# Patient Record
Sex: Female | Born: 1945 | Race: White | Hispanic: No | Smoking: Former smoker
Health system: Southern US, Community
[De-identification: ages and names within clinical notes are randomized; demographics above are authoritative.]

## PROBLEM LIST (undated history)

## (undated) DIAGNOSIS — M1990 Unspecified osteoarthritis, unspecified site: Secondary | ICD-10-CM

## (undated) DIAGNOSIS — I10 Essential (primary) hypertension: Secondary | ICD-10-CM

## (undated) DIAGNOSIS — I209 Angina pectoris, unspecified: Secondary | ICD-10-CM

## (undated) DIAGNOSIS — K219 Gastro-esophageal reflux disease without esophagitis: Secondary | ICD-10-CM

## (undated) DIAGNOSIS — I499 Cardiac arrhythmia, unspecified: Secondary | ICD-10-CM

## (undated) DIAGNOSIS — R002 Palpitations: Secondary | ICD-10-CM

## (undated) DIAGNOSIS — E039 Hypothyroidism, unspecified: Secondary | ICD-10-CM

## (undated) HISTORY — PX: ABDOMINAL HYSTERECTOMY: SHX81

## (undated) HISTORY — PX: OTHER SURGICAL HISTORY: SHX169

## (undated) HISTORY — PX: CHOLECYSTECTOMY: SHX55

## (undated) HISTORY — PX: THROAT SURGERY: SHX803

## (undated) HISTORY — PX: KIDNEY STONE SURGERY: SHX686

---

## 2005-04-05 ENCOUNTER — Other Ambulatory Visit: Payer: Self-pay

## 2005-04-05 ENCOUNTER — Observation Stay: Payer: Self-pay | Admitting: General Surgery

## 2005-04-18 ENCOUNTER — Ambulatory Visit: Payer: Self-pay | Admitting: Internal Medicine

## 2005-04-20 ENCOUNTER — Ambulatory Visit: Payer: Self-pay | Admitting: Internal Medicine

## 2005-08-30 ENCOUNTER — Ambulatory Visit: Payer: Self-pay | Admitting: Unknown Physician Specialty

## 2006-04-22 ENCOUNTER — Ambulatory Visit: Payer: Self-pay | Admitting: Internal Medicine

## 2006-11-06 IMAGING — CR DG ABDOMEN 3V
1 series · 4 of 4 positions shown · non-contrast
Comparison: none

REASON FOR EXAM: vomiting
COMMENTS:  LMP: Post Hysterectomy

[Series 1: view not recorded · 0.17mm/px · 4 of 4 slices shown]
[im 1/4]
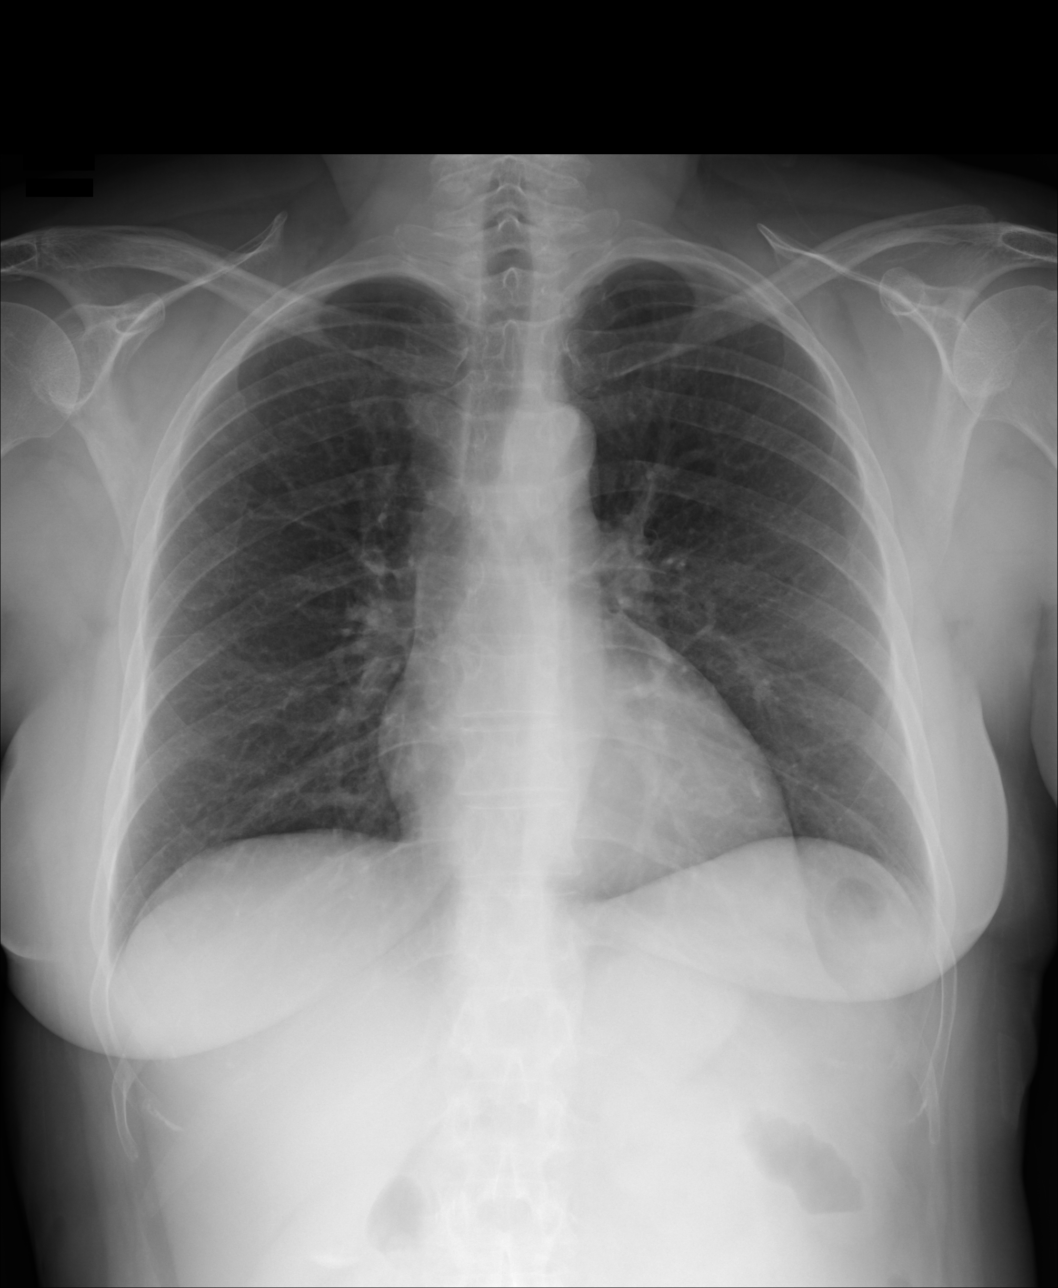
[im 2/4]
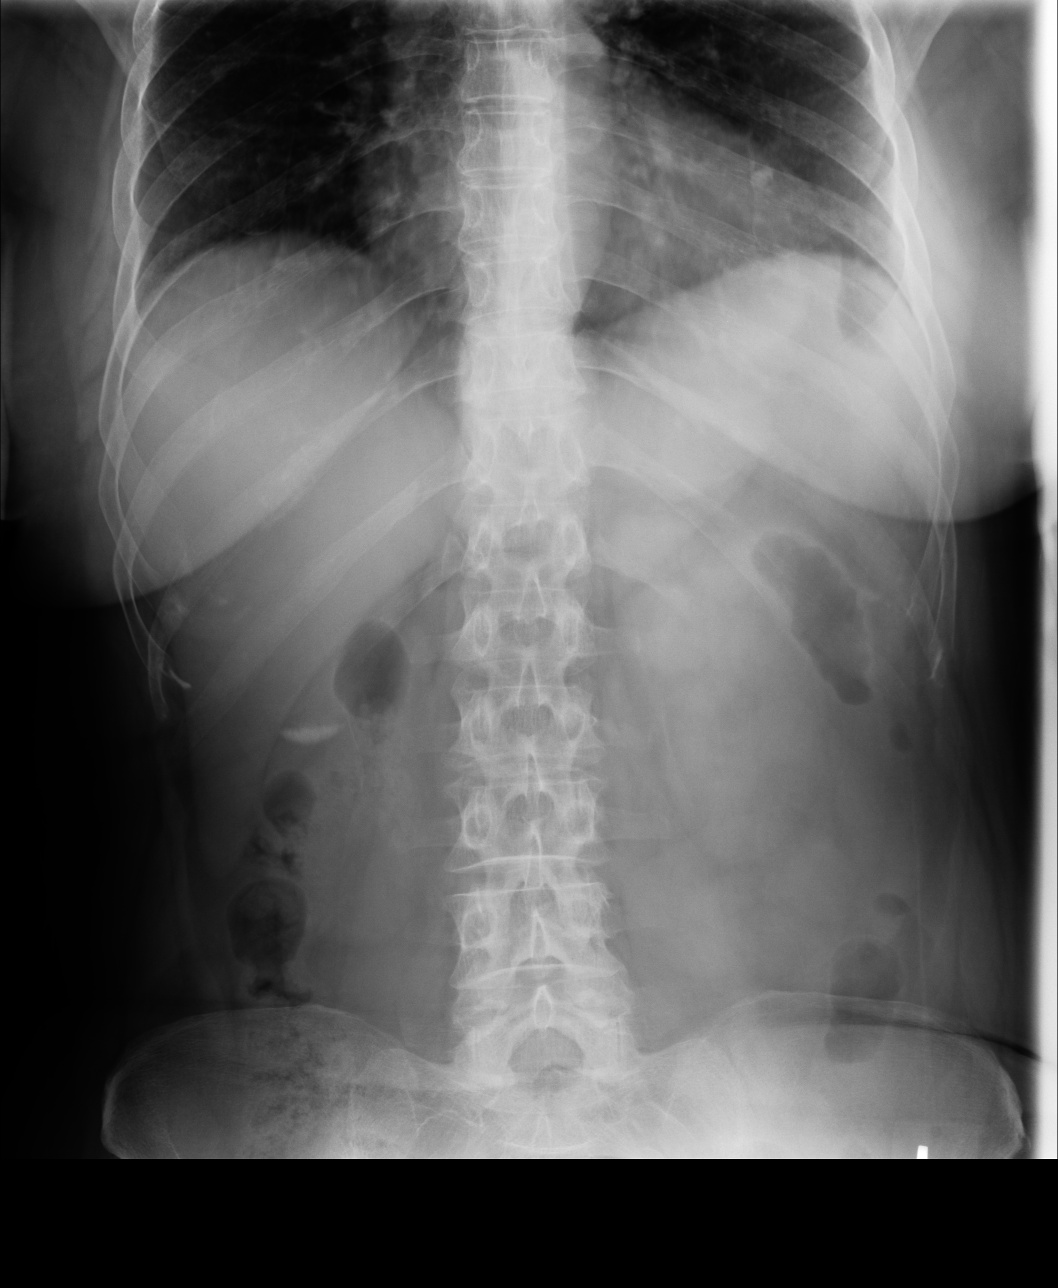
[im 3/4]
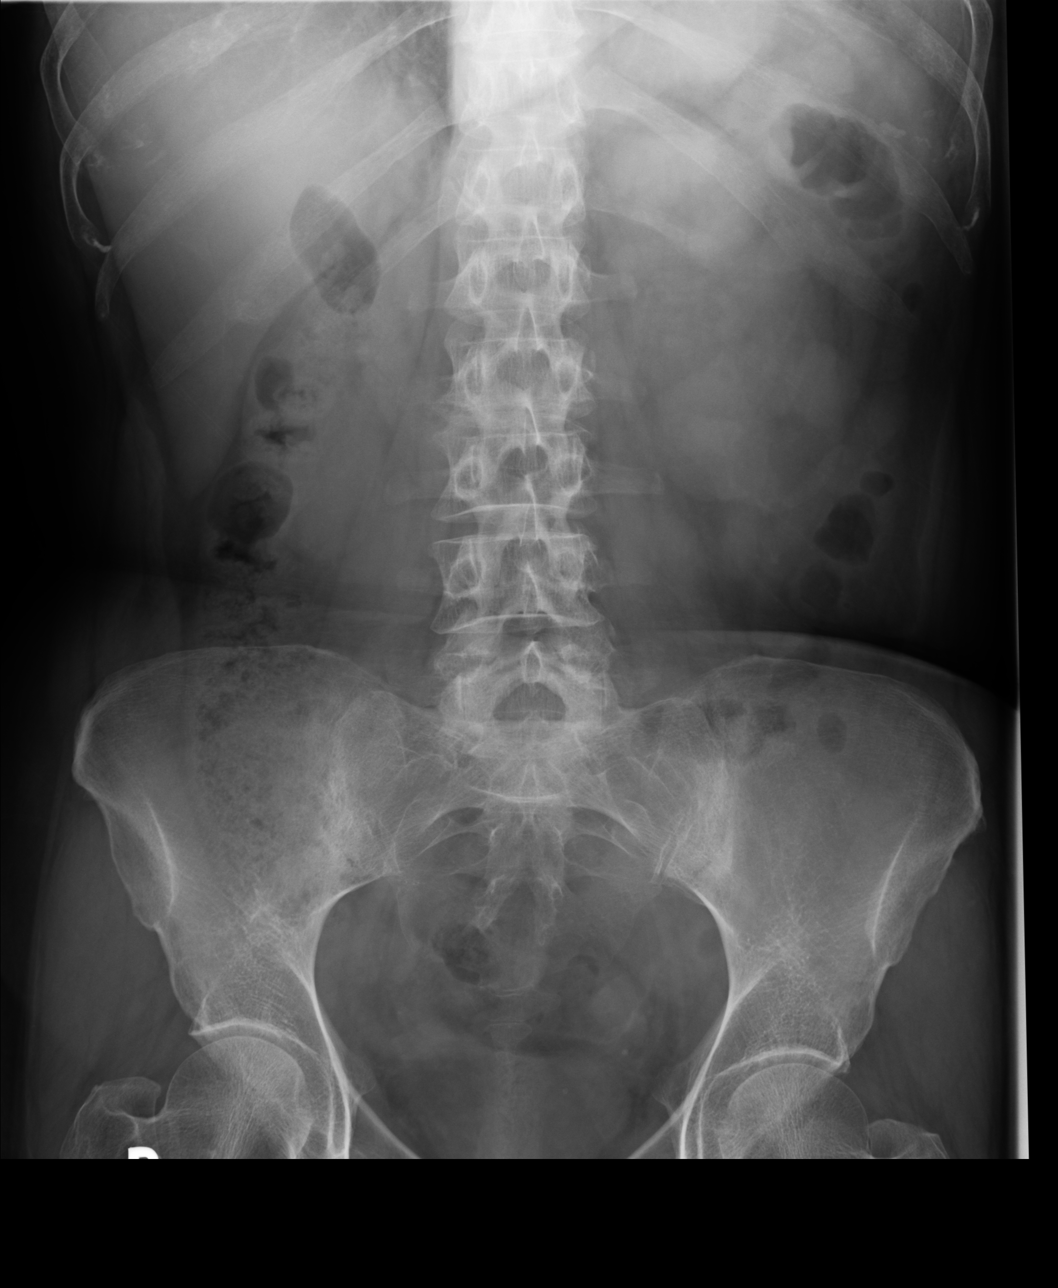
[im 4/4]
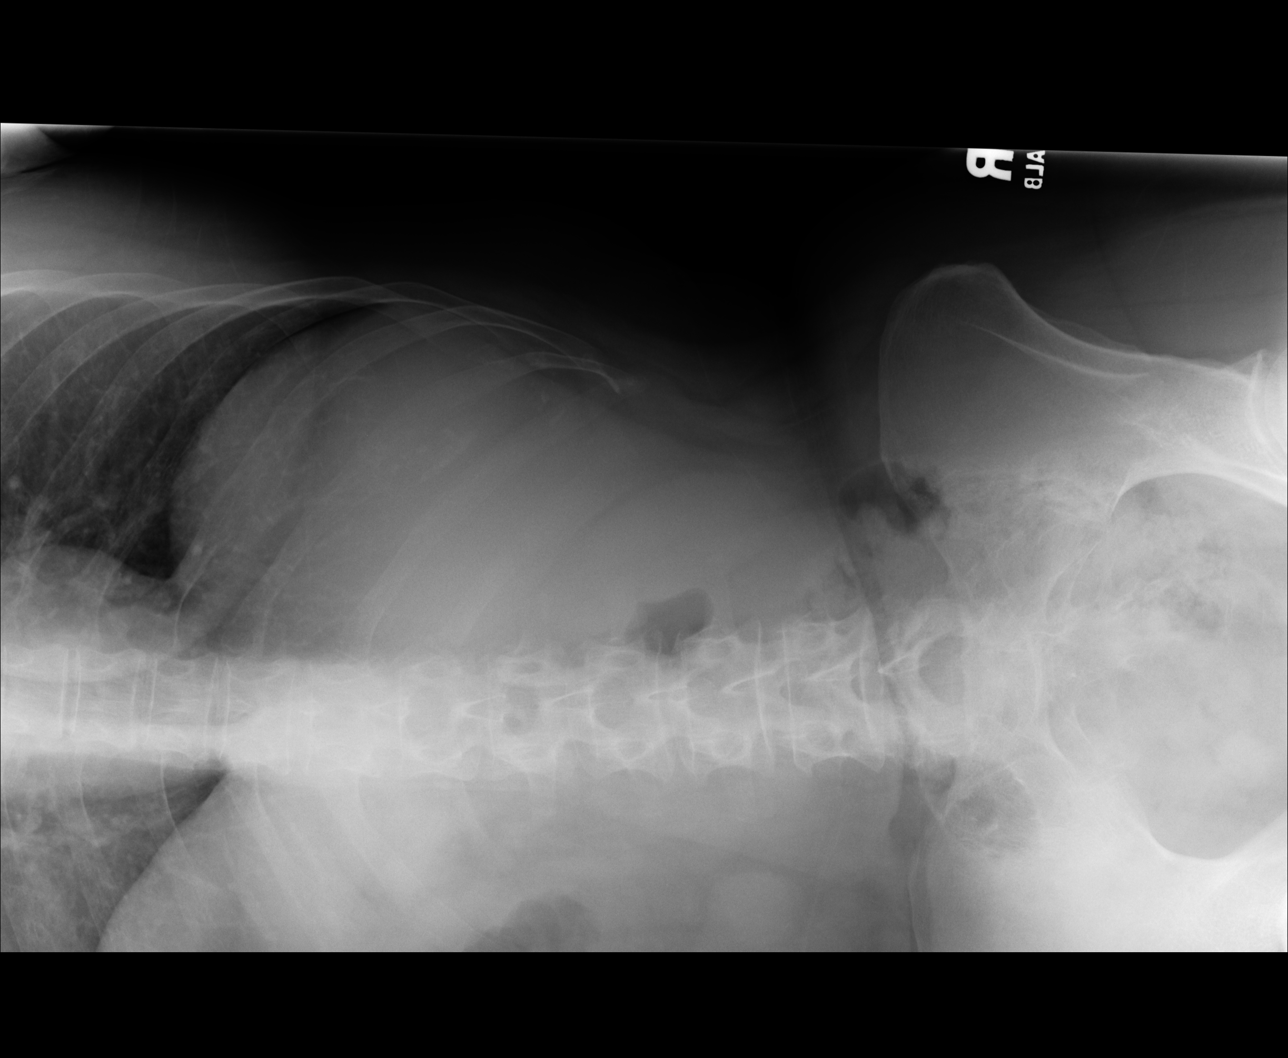

[4 of 4 positions shown; findings below may reference images not displayed]

PROCEDURE:     DXR - DXR ABDOMEN 3-WAY (INCL PA CXR)  - April 05, 2005  [DATE]

RESULT:     The gas pattern is nonspecific with no abnormal distention of
large and/or small bowel.  No free intraperitoneal air is identified.

On the upright film there is layering of ossific densities in the RIGHT
upper quadrant, most likely representing gallstones.

The accompanying chest film reveals the lung fields to be clear.
IMPRESSION: Nonspecific gas pattern. Gallstones present.

## 2006-11-21 IMAGING — MG MM ADDITIONAL VIEWS AT NO CHARGE
1 series · 3 of 3 positions shown · non-contrast
Comparison: none

REASON FOR EXAM: calcification rt breast
COMMENTS:

PROCEDURE:     MAM - MAM DIG ADDVIEWS RT SCR  - April 20, 2005 [DATE]
RESULT:       ML view as well as spot compression ML and CC view were
obtained. The previously seen density compresses out and most likely is
compatible with overlapping fibroglandular tissue.

[R ML · right · 3 of 3 slices shown]
[im 1/3]
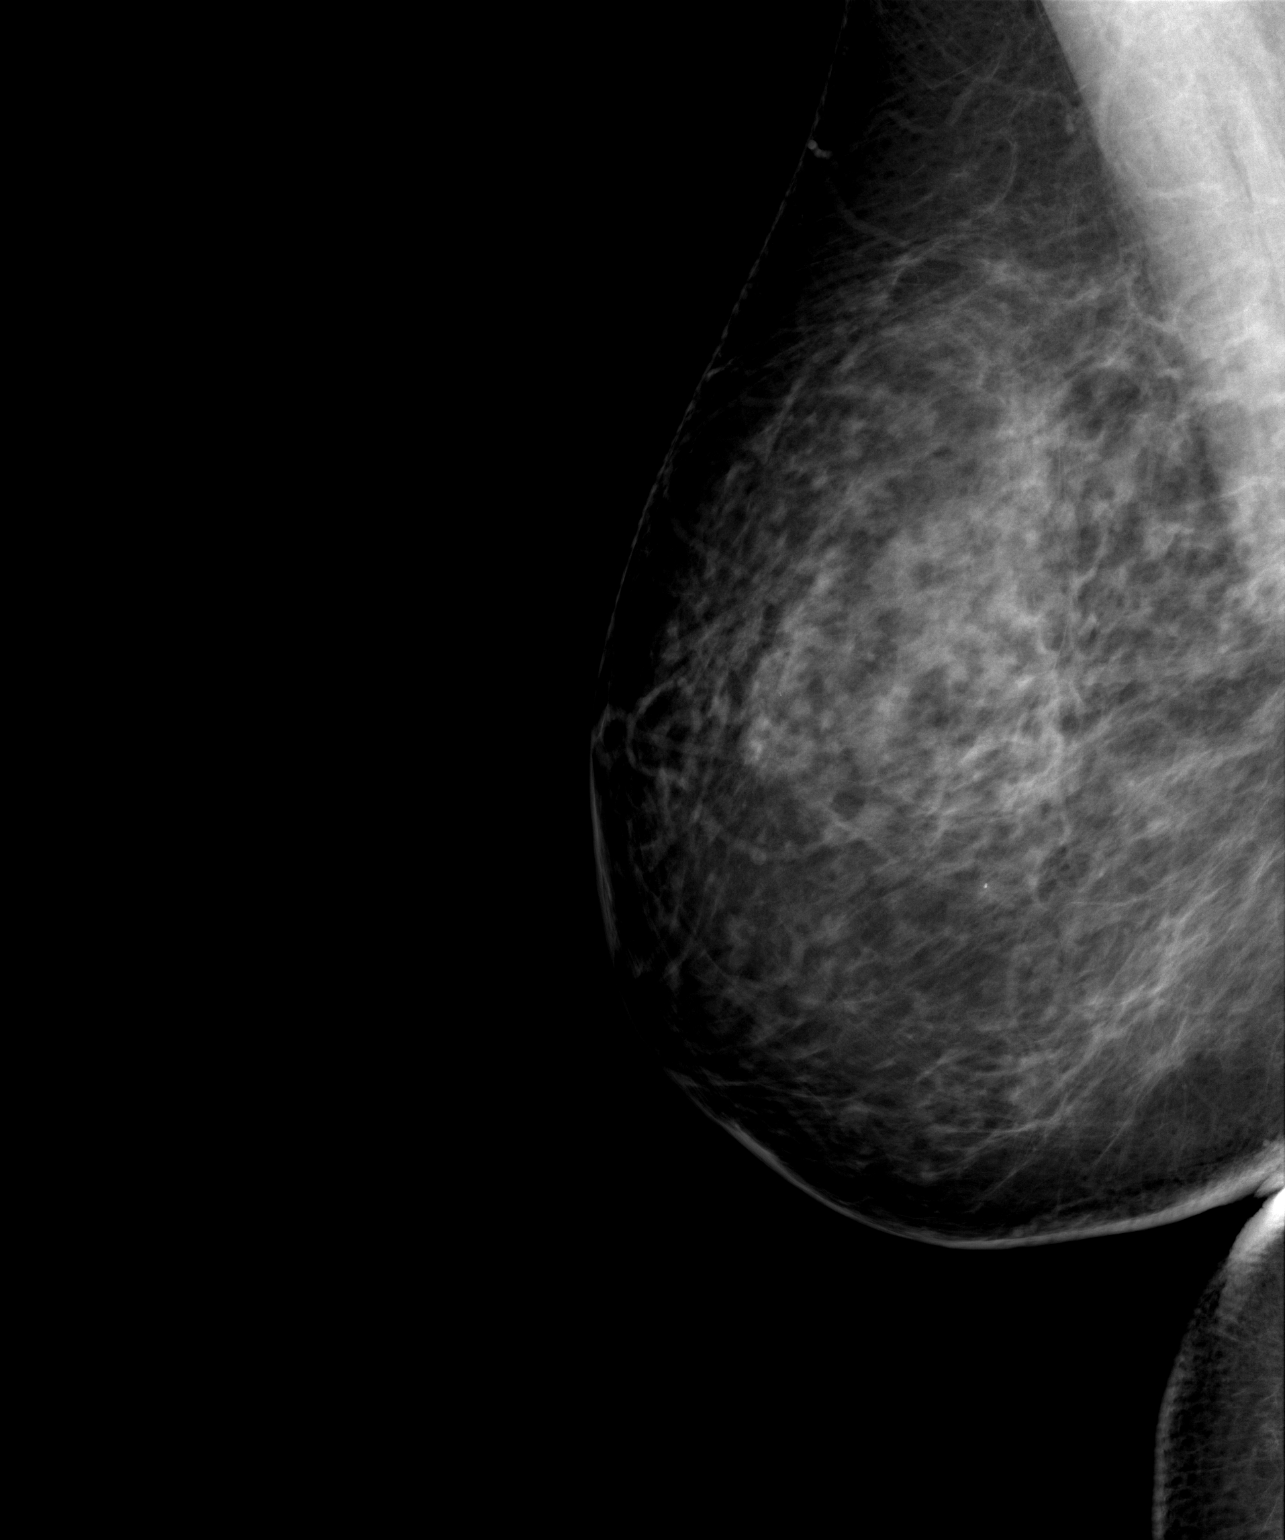
[im 2/3]
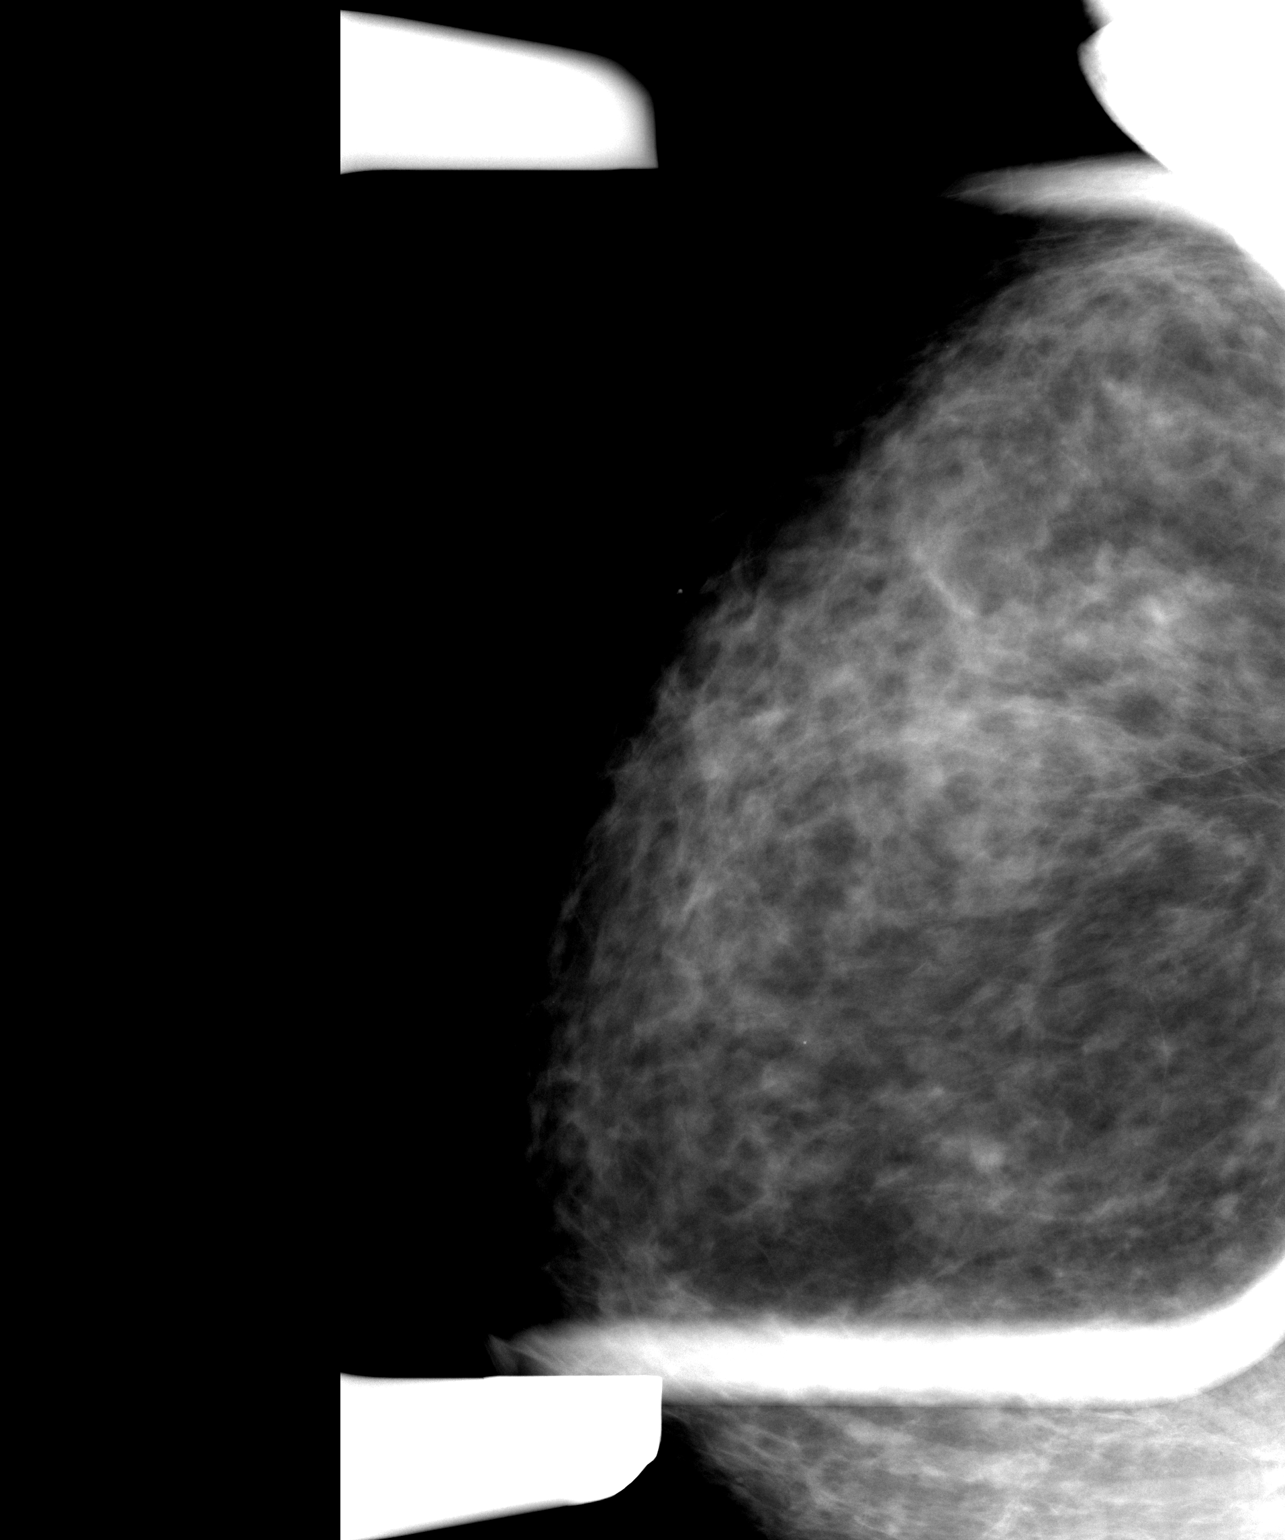
[im 3/3]
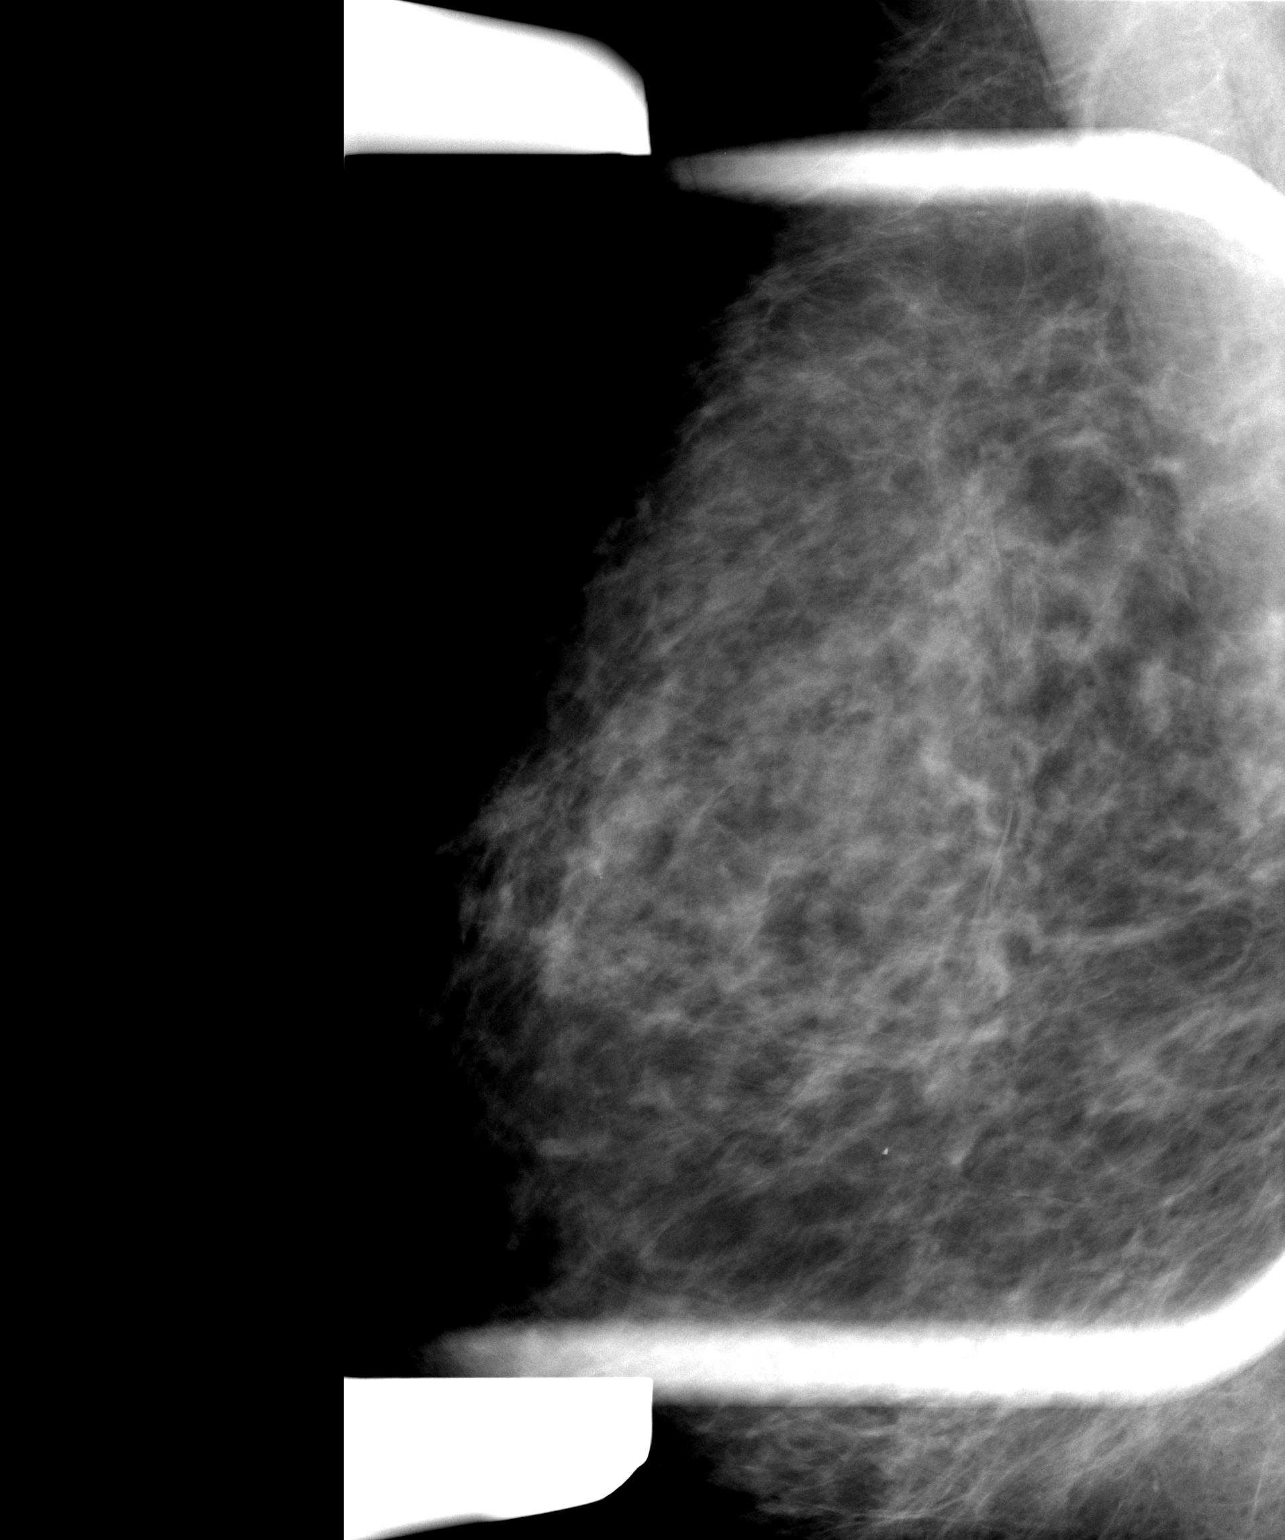

[3 of 3 positions shown; findings below may reference images not displayed]

IMPRESSION: 1.     Continued bilateral annual follow up would be recommended.
2.     BI-RADS: Category 2-Benign Findings.

A NEGATIVE MAMMOGRAM REPORT DOES NOT PRECLUDE BIOPSY OR OTHER EVALUATION OF
A CLINICALLY PALPABLE OR OTHERWISE SUSPICIOUS MASS OR LESION.   BREAST

## 2007-04-28 ENCOUNTER — Ambulatory Visit: Payer: Self-pay | Admitting: Internal Medicine

## 2007-05-28 ENCOUNTER — Ambulatory Visit: Payer: Self-pay | Admitting: Unknown Physician Specialty

## 2008-05-03 ENCOUNTER — Ambulatory Visit: Payer: Self-pay | Admitting: Internal Medicine

## 2008-05-05 ENCOUNTER — Ambulatory Visit: Payer: Self-pay | Admitting: Internal Medicine

## 2009-05-09 ENCOUNTER — Ambulatory Visit: Payer: Self-pay | Admitting: Internal Medicine

## 2010-05-10 ENCOUNTER — Ambulatory Visit: Payer: Self-pay | Admitting: Internal Medicine

## 2011-07-31 ENCOUNTER — Ambulatory Visit: Payer: Self-pay | Admitting: Otolaryngology

## 2011-08-02 ENCOUNTER — Ambulatory Visit: Payer: Self-pay | Admitting: Internal Medicine

## 2011-08-06 ENCOUNTER — Ambulatory Visit: Payer: Self-pay | Admitting: Otolaryngology

## 2012-05-30 ENCOUNTER — Ambulatory Visit: Payer: Self-pay | Admitting: Internal Medicine

## 2012-08-04 ENCOUNTER — Ambulatory Visit: Payer: Self-pay | Admitting: Internal Medicine

## 2012-08-11 ENCOUNTER — Ambulatory Visit: Payer: Self-pay | Admitting: Internal Medicine

## 2013-04-29 ENCOUNTER — Ambulatory Visit: Payer: Self-pay | Admitting: Internal Medicine

## 2013-07-03 ENCOUNTER — Ambulatory Visit: Payer: Self-pay | Admitting: Orthopedic Surgery

## 2013-08-05 ENCOUNTER — Ambulatory Visit: Payer: Self-pay | Admitting: Internal Medicine

## 2014-01-18 ENCOUNTER — Ambulatory Visit: Payer: Self-pay | Admitting: Otolaryngology

## 2014-08-16 ENCOUNTER — Ambulatory Visit: Payer: Self-pay | Admitting: Internal Medicine

## 2014-11-04 DIAGNOSIS — H2513 Age-related nuclear cataract, bilateral: Secondary | ICD-10-CM | POA: Diagnosis not present

## 2014-11-17 DIAGNOSIS — E785 Hyperlipidemia, unspecified: Secondary | ICD-10-CM | POA: Diagnosis not present

## 2014-11-17 DIAGNOSIS — R739 Hyperglycemia, unspecified: Secondary | ICD-10-CM | POA: Diagnosis not present

## 2014-11-17 DIAGNOSIS — Z79899 Other long term (current) drug therapy: Secondary | ICD-10-CM | POA: Diagnosis not present

## 2014-11-17 DIAGNOSIS — I10 Essential (primary) hypertension: Secondary | ICD-10-CM | POA: Diagnosis not present

## 2014-11-17 DIAGNOSIS — R531 Weakness: Secondary | ICD-10-CM | POA: Diagnosis not present

## 2014-11-24 DIAGNOSIS — E780 Pure hypercholesterolemia: Secondary | ICD-10-CM | POA: Diagnosis not present

## 2014-11-24 DIAGNOSIS — I471 Supraventricular tachycardia: Secondary | ICD-10-CM | POA: Diagnosis not present

## 2014-11-24 DIAGNOSIS — I10 Essential (primary) hypertension: Secondary | ICD-10-CM | POA: Diagnosis not present

## 2014-11-24 DIAGNOSIS — Z0000 Encounter for general adult medical examination without abnormal findings: Secondary | ICD-10-CM | POA: Diagnosis not present

## 2014-11-25 ENCOUNTER — Ambulatory Visit: Payer: Self-pay | Admitting: Internal Medicine

## 2014-11-25 DIAGNOSIS — R0789 Other chest pain: Secondary | ICD-10-CM | POA: Diagnosis not present

## 2014-11-25 DIAGNOSIS — K760 Fatty (change of) liver, not elsewhere classified: Secondary | ICD-10-CM | POA: Diagnosis not present

## 2014-11-25 DIAGNOSIS — I517 Cardiomegaly: Secondary | ICD-10-CM | POA: Diagnosis not present

## 2014-11-25 DIAGNOSIS — R0602 Shortness of breath: Secondary | ICD-10-CM | POA: Diagnosis not present

## 2014-12-01 DIAGNOSIS — R070 Pain in throat: Secondary | ICD-10-CM | POA: Diagnosis not present

## 2014-12-01 DIAGNOSIS — J387 Other diseases of larynx: Secondary | ICD-10-CM | POA: Diagnosis not present

## 2014-12-03 DIAGNOSIS — Z1211 Encounter for screening for malignant neoplasm of colon: Secondary | ICD-10-CM | POA: Diagnosis not present

## 2015-01-06 DIAGNOSIS — I471 Supraventricular tachycardia: Secondary | ICD-10-CM | POA: Diagnosis not present

## 2015-01-06 DIAGNOSIS — E782 Mixed hyperlipidemia: Secondary | ICD-10-CM | POA: Diagnosis not present

## 2015-01-06 DIAGNOSIS — I10 Essential (primary) hypertension: Secondary | ICD-10-CM | POA: Diagnosis not present

## 2015-01-20 DIAGNOSIS — M542 Cervicalgia: Secondary | ICD-10-CM | POA: Diagnosis not present

## 2015-01-20 DIAGNOSIS — M47812 Spondylosis without myelopathy or radiculopathy, cervical region: Secondary | ICD-10-CM | POA: Diagnosis not present

## 2015-02-16 DIAGNOSIS — E039 Hypothyroidism, unspecified: Secondary | ICD-10-CM | POA: Diagnosis not present

## 2015-02-23 DIAGNOSIS — E782 Mixed hyperlipidemia: Secondary | ICD-10-CM | POA: Diagnosis not present

## 2015-02-23 DIAGNOSIS — E039 Hypothyroidism, unspecified: Secondary | ICD-10-CM | POA: Diagnosis not present

## 2015-02-23 DIAGNOSIS — R739 Hyperglycemia, unspecified: Secondary | ICD-10-CM | POA: Diagnosis not present

## 2015-02-23 DIAGNOSIS — I10 Essential (primary) hypertension: Secondary | ICD-10-CM | POA: Diagnosis not present

## 2015-04-11 DIAGNOSIS — I10 Essential (primary) hypertension: Secondary | ICD-10-CM | POA: Diagnosis not present

## 2015-04-11 DIAGNOSIS — Z720 Tobacco use: Secondary | ICD-10-CM | POA: Diagnosis not present

## 2015-04-11 DIAGNOSIS — E782 Mixed hyperlipidemia: Secondary | ICD-10-CM | POA: Diagnosis not present

## 2015-04-11 DIAGNOSIS — R739 Hyperglycemia, unspecified: Secondary | ICD-10-CM | POA: Diagnosis not present

## 2015-04-13 ENCOUNTER — Other Ambulatory Visit: Payer: Self-pay | Admitting: Otolaryngology

## 2015-04-13 DIAGNOSIS — R490 Dysphonia: Secondary | ICD-10-CM | POA: Diagnosis not present

## 2015-04-13 DIAGNOSIS — R0602 Shortness of breath: Secondary | ICD-10-CM | POA: Diagnosis not present

## 2015-04-13 DIAGNOSIS — F17200 Nicotine dependence, unspecified, uncomplicated: Secondary | ICD-10-CM

## 2015-04-20 ENCOUNTER — Ambulatory Visit
Admission: RE | Admit: 2015-04-20 | Discharge: 2015-04-20 | Disposition: A | Discharge status: Home / Self Care | Payer: Commercial Managed Care - HMO | Source: Ambulatory Visit | Attending: Otolaryngology | Admitting: Otolaryngology

## 2015-04-20 DIAGNOSIS — K760 Fatty (change of) liver, not elsewhere classified: Secondary | ICD-10-CM | POA: Diagnosis not present

## 2015-04-20 DIAGNOSIS — R0602 Shortness of breath: Secondary | ICD-10-CM | POA: Diagnosis not present

## 2015-04-20 DIAGNOSIS — F17200 Nicotine dependence, unspecified, uncomplicated: Secondary | ICD-10-CM

## 2015-04-20 DIAGNOSIS — Z87891 Personal history of nicotine dependence: Secondary | ICD-10-CM | POA: Diagnosis not present

## 2015-04-20 DIAGNOSIS — J9811 Atelectasis: Secondary | ICD-10-CM | POA: Diagnosis not present

## 2015-04-20 HISTORY — DX: Essential (primary) hypertension: I10

## 2015-04-20 MED ORDER — IOHEXOL 350 MG/ML SOLN
75.0000 mL | Freq: Once | INTRAVENOUS | Status: AC | PRN
  Administered 2015-04-20: 75 mL via INTRAVENOUS

## 2015-06-06 DIAGNOSIS — E782 Mixed hyperlipidemia: Secondary | ICD-10-CM | POA: Diagnosis not present

## 2015-06-06 DIAGNOSIS — R739 Hyperglycemia, unspecified: Secondary | ICD-10-CM | POA: Diagnosis not present

## 2015-06-06 DIAGNOSIS — E039 Hypothyroidism, unspecified: Secondary | ICD-10-CM | POA: Diagnosis not present

## 2015-06-06 DIAGNOSIS — I10 Essential (primary) hypertension: Secondary | ICD-10-CM | POA: Diagnosis not present

## 2015-06-06 DIAGNOSIS — Z79899 Other long term (current) drug therapy: Secondary | ICD-10-CM | POA: Diagnosis not present

## 2015-06-13 DIAGNOSIS — I10 Essential (primary) hypertension: Secondary | ICD-10-CM | POA: Diagnosis not present

## 2015-06-13 DIAGNOSIS — E039 Hypothyroidism, unspecified: Secondary | ICD-10-CM | POA: Diagnosis not present

## 2015-06-13 DIAGNOSIS — E782 Mixed hyperlipidemia: Secondary | ICD-10-CM | POA: Diagnosis not present

## 2015-06-13 DIAGNOSIS — R739 Hyperglycemia, unspecified: Secondary | ICD-10-CM | POA: Diagnosis not present

## 2015-07-22 DIAGNOSIS — Z23 Encounter for immunization: Secondary | ICD-10-CM | POA: Diagnosis not present

## 2015-09-22 DIAGNOSIS — M25562 Pain in left knee: Secondary | ICD-10-CM | POA: Diagnosis not present

## 2015-09-22 DIAGNOSIS — M25552 Pain in left hip: Secondary | ICD-10-CM | POA: Diagnosis not present

## 2015-11-25 DIAGNOSIS — E039 Hypothyroidism, unspecified: Secondary | ICD-10-CM | POA: Diagnosis not present

## 2015-11-25 DIAGNOSIS — Z79899 Other long term (current) drug therapy: Secondary | ICD-10-CM | POA: Diagnosis not present

## 2015-11-25 DIAGNOSIS — I10 Essential (primary) hypertension: Secondary | ICD-10-CM | POA: Diagnosis not present

## 2015-11-25 DIAGNOSIS — R739 Hyperglycemia, unspecified: Secondary | ICD-10-CM | POA: Diagnosis not present

## 2015-11-25 DIAGNOSIS — E782 Mixed hyperlipidemia: Secondary | ICD-10-CM | POA: Diagnosis not present

## 2015-12-02 DIAGNOSIS — Z0000 Encounter for general adult medical examination without abnormal findings: Secondary | ICD-10-CM | POA: Diagnosis not present

## 2015-12-02 DIAGNOSIS — Z23 Encounter for immunization: Secondary | ICD-10-CM | POA: Diagnosis not present

## 2015-12-02 DIAGNOSIS — I10 Essential (primary) hypertension: Secondary | ICD-10-CM | POA: Diagnosis not present

## 2015-12-02 DIAGNOSIS — R739 Hyperglycemia, unspecified: Secondary | ICD-10-CM | POA: Diagnosis not present

## 2015-12-02 DIAGNOSIS — R0602 Shortness of breath: Secondary | ICD-10-CM | POA: Diagnosis not present

## 2015-12-02 DIAGNOSIS — I471 Supraventricular tachycardia: Secondary | ICD-10-CM | POA: Diagnosis not present

## 2015-12-02 DIAGNOSIS — E039 Hypothyroidism, unspecified: Secondary | ICD-10-CM | POA: Diagnosis not present

## 2015-12-02 DIAGNOSIS — Z1239 Encounter for other screening for malignant neoplasm of breast: Secondary | ICD-10-CM | POA: Diagnosis not present

## 2015-12-02 DIAGNOSIS — E782 Mixed hyperlipidemia: Secondary | ICD-10-CM | POA: Diagnosis not present

## 2015-12-02 DIAGNOSIS — R0609 Other forms of dyspnea: Secondary | ICD-10-CM | POA: Diagnosis not present

## 2015-12-07 ENCOUNTER — Other Ambulatory Visit: Payer: Self-pay | Admitting: Internal Medicine

## 2015-12-07 DIAGNOSIS — Z1231 Encounter for screening mammogram for malignant neoplasm of breast: Secondary | ICD-10-CM

## 2015-12-28 DIAGNOSIS — H2513 Age-related nuclear cataract, bilateral: Secondary | ICD-10-CM | POA: Diagnosis not present

## 2016-01-09 ENCOUNTER — Ambulatory Visit
Admission: RE | Admit: 2016-01-09 | Discharge: 2016-01-09 | Disposition: A | Discharge status: Home / Self Care | Payer: Commercial Managed Care - HMO | Source: Ambulatory Visit | Attending: Internal Medicine | Admitting: Internal Medicine

## 2016-01-09 ENCOUNTER — Other Ambulatory Visit: Payer: Self-pay | Admitting: Internal Medicine

## 2016-01-09 DIAGNOSIS — Z1231 Encounter for screening mammogram for malignant neoplasm of breast: Secondary | ICD-10-CM | POA: Diagnosis not present

## 2016-03-16 DIAGNOSIS — H2512 Age-related nuclear cataract, left eye: Secondary | ICD-10-CM | POA: Diagnosis not present

## 2016-05-09 DIAGNOSIS — H2512 Age-related nuclear cataract, left eye: Secondary | ICD-10-CM | POA: Diagnosis not present

## 2016-05-10 ENCOUNTER — Encounter: Payer: Self-pay | Admitting: *Deleted

## 2016-05-15 ENCOUNTER — Ambulatory Visit: Payer: Commercial Managed Care - HMO | Admitting: Anesthesiology

## 2016-05-15 ENCOUNTER — Encounter: Payer: Self-pay | Admitting: *Deleted

## 2016-05-15 ENCOUNTER — Encounter
Admission: RE | Disposition: A | Discharge status: Home / Self Care | Payer: Self-pay | Source: Ambulatory Visit | Attending: Ophthalmology

## 2016-05-15 ENCOUNTER — Ambulatory Visit
Admission: RE | Admit: 2016-05-15 | Discharge: 2016-05-15 | Disposition: A | Discharge status: Home / Self Care | Payer: Commercial Managed Care - HMO | Source: Ambulatory Visit | Attending: Ophthalmology | Admitting: Ophthalmology

## 2016-05-15 DIAGNOSIS — Z886 Allergy status to analgesic agent status: Secondary | ICD-10-CM | POA: Diagnosis not present

## 2016-05-15 DIAGNOSIS — Z9049 Acquired absence of other specified parts of digestive tract: Secondary | ICD-10-CM | POA: Insufficient documentation

## 2016-05-15 DIAGNOSIS — I10 Essential (primary) hypertension: Secondary | ICD-10-CM | POA: Diagnosis not present

## 2016-05-15 DIAGNOSIS — Z90710 Acquired absence of both cervix and uterus: Secondary | ICD-10-CM | POA: Insufficient documentation

## 2016-05-15 DIAGNOSIS — M1990 Unspecified osteoarthritis, unspecified site: Secondary | ICD-10-CM | POA: Insufficient documentation

## 2016-05-15 DIAGNOSIS — Z87442 Personal history of urinary calculi: Secondary | ICD-10-CM | POA: Insufficient documentation

## 2016-05-15 DIAGNOSIS — Z79899 Other long term (current) drug therapy: Secondary | ICD-10-CM | POA: Diagnosis not present

## 2016-05-15 DIAGNOSIS — Z9109 Other allergy status, other than to drugs and biological substances: Secondary | ICD-10-CM | POA: Diagnosis not present

## 2016-05-15 DIAGNOSIS — Z87891 Personal history of nicotine dependence: Secondary | ICD-10-CM | POA: Diagnosis not present

## 2016-05-15 DIAGNOSIS — E7800 Pure hypercholesterolemia, unspecified: Secondary | ICD-10-CM | POA: Insufficient documentation

## 2016-05-15 DIAGNOSIS — M810 Age-related osteoporosis without current pathological fracture: Secondary | ICD-10-CM | POA: Insufficient documentation

## 2016-05-15 DIAGNOSIS — K219 Gastro-esophageal reflux disease without esophagitis: Secondary | ICD-10-CM | POA: Diagnosis not present

## 2016-05-15 DIAGNOSIS — Z9889 Other specified postprocedural states: Secondary | ICD-10-CM | POA: Insufficient documentation

## 2016-05-15 DIAGNOSIS — H2512 Age-related nuclear cataract, left eye: Secondary | ICD-10-CM | POA: Insufficient documentation

## 2016-05-15 DIAGNOSIS — E079 Disorder of thyroid, unspecified: Secondary | ICD-10-CM | POA: Diagnosis not present

## 2016-05-15 DIAGNOSIS — E039 Hypothyroidism, unspecified: Secondary | ICD-10-CM | POA: Diagnosis not present

## 2016-05-15 DIAGNOSIS — H269 Unspecified cataract: Secondary | ICD-10-CM | POA: Diagnosis present

## 2016-05-15 HISTORY — DX: Angina pectoris, unspecified: I20.9

## 2016-05-15 HISTORY — DX: Palpitations: R00.2

## 2016-05-15 HISTORY — DX: Cardiac arrhythmia, unspecified: I49.9

## 2016-05-15 HISTORY — DX: Unspecified osteoarthritis, unspecified site: M19.90

## 2016-05-15 HISTORY — DX: Gastro-esophageal reflux disease without esophagitis: K21.9

## 2016-05-15 HISTORY — PX: CATARACT EXTRACTION W/PHACO: SHX586

## 2016-05-15 HISTORY — DX: Hypothyroidism, unspecified: E03.9

## 2016-05-15 SURGERY — PHACOEMULSIFICATION, CATARACT, WITH IOL INSERTION
Anesthesia: Monitor Anesthesia Care | Site: Eye | Laterality: Left | Wound class: Clean

## 2016-05-15 MED ORDER — NA CHONDROIT SULF-NA HYALURON 40-17 MG/ML IO SOLN
INTRAOCULAR | Status: DC | PRN
  Administered 2016-05-15: 1 mL via INTRAOCULAR

## 2016-05-15 MED ORDER — NA CHONDROIT SULF-NA HYALURON 40-17 MG/ML IO SOLN
INTRAOCULAR | Status: AC
  Filled 2016-05-15: qty 1

## 2016-05-15 MED ORDER — MIDAZOLAM HCL 2 MG/2ML IJ SOLN
INTRAMUSCULAR | Status: DC | PRN
  Administered 2016-05-15: 1 mg via INTRAVENOUS

## 2016-05-15 MED ORDER — LIDOCAINE HCL (PF) 1 % IJ SOLN
INTRAMUSCULAR | Status: AC
  Filled 2016-05-15: qty 2

## 2016-05-15 MED ORDER — ARMC OPHTHALMIC DILATING GEL
1.0000 "application " | OPHTHALMIC | Status: AC | PRN
  Administered 2016-05-15 (×2): 1 via OPHTHALMIC

## 2016-05-15 MED ORDER — MOXIFLOXACIN HCL 0.5 % OP SOLN: 1.0000 [drp] | OPHTHALMIC | Status: DC | PRN

## 2016-05-15 MED ORDER — CEFUROXIME OPHTHALMIC INJECTION 1 MG/0.1 ML
INJECTION | OPHTHALMIC | Status: AC
Start: 2016-05-15 — End: 2016-05-15
  Filled 2016-05-15: qty 0.1

## 2016-05-15 MED ORDER — MOXIFLOXACIN HCL 0.5 % OP SOLN
OPHTHALMIC | Status: AC
  Filled 2016-05-15: qty 3

## 2016-05-15 MED ORDER — NA CHONDROIT SULF-NA HYALURON 40-17 MG/ML IO SOLN
INTRAOCULAR | Status: AC
Start: 2016-05-15 — End: 2016-05-15
  Filled 2016-05-15: qty 1

## 2016-05-15 MED ORDER — SODIUM CHLORIDE 0.9 % IV SOLN
INTRAVENOUS | Status: DC
  Administered 2016-05-15: 10:00:00 via INTRAVENOUS

## 2016-05-15 MED ORDER — POVIDONE-IODINE 5 % OP SOLN: 1.0000 "application " | Freq: Once | OPHTHALMIC | Status: DC

## 2016-05-15 MED ORDER — POVIDONE-IODINE 5 % OP SOLN
OPHTHALMIC | Status: AC
  Filled 2016-05-15: qty 30

## 2016-05-15 MED ORDER — TETRACAINE HCL 0.5 % OP SOLN
1.0000 [drp] | Freq: Once | OPHTHALMIC | Status: AC
  Administered 2016-05-15: 1 [drp] via OPHTHALMIC

## 2016-05-15 MED ORDER — CARBACHOL 0.01 % IO SOLN
INTRAOCULAR | Status: DC | PRN
  Administered 2016-05-15: .5 mL via INTRAOCULAR

## 2016-05-15 MED ORDER — MOXIFLOXACIN HCL 0.5 % OP SOLN
OPHTHALMIC | Status: DC | PRN
  Administered 2016-05-15: 1 [drp] via OPHTHALMIC

## 2016-05-15 MED ORDER — EPINEPHRINE HCL 1 MG/ML IJ SOLN
INTRAMUSCULAR | Status: AC
  Filled 2016-05-15: qty 2

## 2016-05-15 MED ORDER — ARMC OPHTHALMIC DILATING GEL
OPHTHALMIC | Status: AC
  Administered 2016-05-15: 1 via OPHTHALMIC
  Filled 2016-05-15: qty 0.25

## 2016-05-15 MED ORDER — LIDOCAINE HCL (PF) 4 % IJ SOLN
INTRAMUSCULAR | Status: AC
  Filled 2016-05-15: qty 5

## 2016-05-15 MED ORDER — TETRACAINE HCL 0.5 % OP SOLN
OPHTHALMIC | Status: AC
  Administered 2016-05-15: 1 [drp] via OPHTHALMIC
  Filled 2016-05-15: qty 2

## 2016-05-15 MED ORDER — EPINEPHRINE HCL 1 MG/ML IJ SOLN
INTRAMUSCULAR | Status: DC | PRN
  Administered 2016-05-15: 1 mL via OPHTHALMIC

## 2016-05-15 MED ORDER — CEFUROXIME OPHTHALMIC INJECTION 1 MG/0.1 ML
INJECTION | OPHTHALMIC | Status: AC
  Filled 2016-05-15: qty 0.1

## 2016-05-15 MED ORDER — ALFENTANIL 500 MCG/ML IJ INJ
INJECTION | INTRAMUSCULAR | Status: DC | PRN
  Administered 2016-05-15 (×2): 250 ug via INTRAVENOUS

## 2016-05-15 MED ORDER — LIDOCAINE HCL (PF) 4 % IJ SOLN
INTRAOCULAR | Status: DC | PRN
  Administered 2016-05-15: .5 mL via OPHTHALMIC

## 2016-05-15 MED ORDER — CEFUROXIME OPHTHALMIC INJECTION 1 MG/0.1 ML
INJECTION | OPHTHALMIC | Status: DC | PRN
  Administered 2016-05-15: .1 mL via INTRACAMERAL

## 2016-05-15 MED ORDER — EPINEPHRINE HCL 1 MG/ML IJ SOLN
INTRAMUSCULAR | Status: AC
  Filled 2016-05-15: qty 1

## 2016-05-15 SURGICAL SUPPLY — 23 items
CANNULA ANT/CHMB 27GA (MISCELLANEOUS) ×3 IMPLANT
CUP MEDICINE 2OZ PLAST GRAD ST (MISCELLANEOUS) ×3 IMPLANT
GLOVE BIO SURGEON STRL SZ8 (GLOVE) ×3 IMPLANT
GLOVE BIOGEL M 6.5 STRL (GLOVE) ×3 IMPLANT
GLOVE SURG LX 8.0 MICRO (GLOVE) ×2
GLOVE SURG LX STRL 8.0 MICRO (GLOVE) ×1 IMPLANT
GOWN STRL REUS W/ TWL LRG LVL3 (GOWN DISPOSABLE) ×2 IMPLANT
GOWN STRL REUS W/TWL LRG LVL3 (GOWN DISPOSABLE) ×4
LENS IOL ACRSF IQ TRC 8 25.0 (Intraocular Lens) ×1 IMPLANT
LENS IOL ACRYSOF IQ TORIC 25.0 (Intraocular Lens) ×2 IMPLANT
LENS IOL IQ TORIC 8 25.0 (Intraocular Lens) ×1 IMPLANT
PACK CATARACT (MISCELLANEOUS) ×3 IMPLANT
PACK CATARACT BRASINGTON LX (MISCELLANEOUS) ×3 IMPLANT
PACK EYE AFTER SURG (MISCELLANEOUS) ×3 IMPLANT
SOL BSS BAG (MISCELLANEOUS) ×3
SOL PREP PVP 2OZ (MISCELLANEOUS) ×3
SOLUTION BSS BAG (MISCELLANEOUS) ×1 IMPLANT
SOLUTION PREP PVP 2OZ (MISCELLANEOUS) ×1 IMPLANT
SYR 3ML LL SCALE MARK (SYRINGE) ×3 IMPLANT
SYR 5ML LL (SYRINGE) ×3 IMPLANT
SYR TB 1ML 27GX1/2 LL (SYRINGE) ×3 IMPLANT
WATER STERILE IRR 1000ML POUR (IV SOLUTION) ×3 IMPLANT
WIPE NON LINTING 3.25X3.25 (MISCELLANEOUS) ×3 IMPLANT

## 2016-05-15 NOTE — Op Note (Signed)
PREOPERATIVE DIAGNOSIS:  Nuclear sclerotic cataract of the left eye.   POSTOPERATIVE DIAGNOSIS:  nuclear sclerotic cataract left eye   OPERATIVE PROCEDURE:  Procedure(s): CATARACT EXTRACTION PHACO AND INTRAOCULAR LENS PLACEMENT (IOC)   SURGEON:  Birder Robson, MD.   ANESTHESIA: 1.      Managed anesthesia care. 2.      Topical tetracaine drops followed by 2% Xylocaine jelly applied in the preoperative holding area.  Anesthesiologist: Molli Barrows, MD CRNA: Courtney Paris, CRNA   COMPLICATIONS:  None.   TECHNIQUE:   Stop and chop    DESCRIPTION OF PROCEDURE:  The patient was examined and consented in the preoperative holding area where the aforementioned topical anesthesia was applied to the left eye.  The patient was brought back to the Operating Room where he was sat upright on the gurney and given a target to fixate upon while the eye was marked at the 3:00 and 9:00 position.  The patient was then reclined on the operating table.  The eye was prepped and draped in the usual sterile ophthalmic fashion and a lid speculum was placed. A paracentesis was created with the side port blade and the anterior chamber was filled with viscoelastic. A near clear corneal incision was performed with the steel keratome. A continuous curvilinear capsulorrhexis was performed with a cystotome followed by the capsulorrhexis forceps. Hydrodissection and hydrodelineation were carried out with BSS on a blunt cannula. The lens was removed in a stop and chop technique and the remaining cortical material was removed with the irrigation-aspiration handpiece. The eye was inflated with viscoelastic and the ZCT lens  was placed in the eye and rotated to within a few degrees of the predetermined orientation.  The remaining viscoelastic was removed from the eye.  The Sinskey hook was used to rotate the toric lens into its final resting place at 085 degrees.  0.1 mL of cefuroxime concentration 10 mg/mL was placed in the  anterior chamber. The eye was inflated to a physiologic pressure and found to be watertight.  The eye was dressed with Vigamox. The patient was given protective glasses to wear throughout the day and a shield with which to sleep tonight. The patient was also given drops with which to begin a drop regimen today and will follow-up with me in one day.  Implant Name Type Inv. Item Serial No. Manufacturer Lot No. LRB No. Used  LENS IOL TORIC ACRYLATE 25.0 - [id number] Intraocular Lens LENS IOL TORIC ACRYLATE 25.0 [id number] ALCON   Left 1   Procedure(s) with comments: CATARACT EXTRACTION PHACO AND INTRAOCULAR LENS PLACEMENT (IOC) (Left) - [place] 00:34 AP% 20.1 CDE 6.98 Fluid pack lot # [id number]  Electronically signed: [place] 05/15/2016 11:27 AM

## 2016-05-15 NOTE — Discharge Instructions (Signed)
Eye Surgery Discharge Instructions  Expect mild scratchy sensation or mild soreness. DO NOT RUB YOUR EYE!  The day of surgery:  Minimal physical activity, but bed rest is not required  No reading, computer work, or close hand work  No bending, lifting, or straining.  May watch TV  For 24 hours:  No driving, legal decisions, or alcoholic beverages  Safety precautions  Eat anything you prefer: It is better to start with liquids, then soup then solid foods.  _____ Eye patch should be worn until postoperative exam tomorrow.  ____ Solar shield eyeglasses should be worn for comfort in the sunlight/patch while sleeping  Resume all regular medications including aspirin or Coumadin if these were discontinued prior to surgery. You may shower, bathe, shave, or wash your hair. Tylenol may be taken for mild discomfort.  Call your doctor if you experience significant pain, nausea, or vomiting, fever > 101 or other signs of infection. [phone] or [phone] Specific instructions:  Follow-up Information    PORFILIO,WILLIAM LOUIS, MD Follow up on 05/16/2016.   Specialty:  Ophthalmology Why:  9:20 Contact information: [address] [place] [address] [phone]

## 2016-05-15 NOTE — Transfer of Care (Signed)
Immediate Anesthesia Transfer of Care Note  Patient: SHARILEE LOUPE  Procedure(s) Performed: Procedure(s) with comments: CATARACT EXTRACTION PHACO AND INTRAOCULAR LENS PLACEMENT (IOC) (Left) - [place] 00:34 AP% 20.1 CDE 6.98 Fluid pack lot # [id number]  Patient Location: PACU and Short Stay  Anesthesia Type:MAC  Level of Consciousness: awake, oriented and patient cooperative  Airway & Oxygen Therapy: Patient Spontanous Breathing  Post-op Assessment: Report given to RN and Post -op Vital signs reviewed and stable  Post vital signs: Reviewed  Last Vitals:  Vitals:   05/15/16 1012  BP: (!) 146/95  Pulse: 68  Resp: 16  Temp: 36.9 C    Last Pain:  Vitals:   05/15/16 1012  TempSrc: Oral         Complications: No apparent anesthesia complications

## 2016-05-15 NOTE — Anesthesia Postprocedure Evaluation (Signed)
Anesthesia Post Note  Patient: Charlene Thomas  Procedure(s) Performed: Procedure(s) (LRB): CATARACT EXTRACTION PHACO AND INTRAOCULAR LENS PLACEMENT (IOC) (Left)  Patient location during evaluation: PACU Anesthesia Type: General Level of consciousness: awake and alert Pain management: pain level controlled Vital Signs Assessment: post-procedure vital signs reviewed and stable Respiratory status: spontaneous breathing, nonlabored ventilation, respiratory function stable and patient connected to nasal cannula oxygen Cardiovascular status: blood pressure returned to baseline and stable Postop Assessment: no signs of nausea or vomiting Anesthetic complications: no    Last Vitals:  Vitals:   05/15/16 1128 05/15/16 1144  BP: 128/89 133/90  Pulse: 73 67  Resp:  16  Temp: 36.7 C     Last Pain:  Vitals:   05/15/16 1012  TempSrc: Oral                 Molli Barrows

## 2016-05-15 NOTE — Anesthesia Preprocedure Evaluation (Signed)
Anesthesia Evaluation  Patient identified by MRN, date of birth, ID band Patient awake    Reviewed: Allergy & Precautions, H&P , NPO status , Patient's Chart, lab work & pertinent test results, reviewed documented beta blocker date and time   Airway Mallampati: II  TM Distance: >3 FB Neck ROM: full    Dental no notable dental hx. (+) Teeth Intact   Pulmonary neg pulmonary ROS, former smoker,    Pulmonary exam normal breath sounds clear to auscultation       Cardiovascular Exercise Tolerance: Poor hypertension, + angina with exertion negative cardio ROS  + dysrhythmias  Rhythm:regular Rate:Normal  Stable cardiac hx without recent problems.     Neuro/Psych negative neurological ROS  negative psych ROS   GI/Hepatic negative GI ROS, Neg liver ROS, GERD  Medicated,  Endo/Other  negative endocrine ROSdiabetesHypothyroidism   Renal/GU      Musculoskeletal   Abdominal   Peds  Hematology negative hematology ROS (+)   Anesthesia Other Findings   Reproductive/Obstetrics negative OB ROS                             Anesthesia Physical Anesthesia Plan  ASA: III  Anesthesia Plan: MAC   Post-op Pain Management:    Induction:   Airway Management Planned:   Additional Equipment:   Intra-op Plan:   Post-operative Plan:   Informed Consent: I have reviewed the patients History and Physical, chart, labs and discussed the procedure including the risks, benefits and alternatives for the proposed anesthesia with the patient or authorized representative who has indicated his/her understanding and acceptance.     Plan Discussed with: CRNA  Anesthesia Plan Comments:         Anesthesia Quick Evaluation

## 2016-05-15 NOTE — H&P (Signed)
  All labs reviewed. Abnormal studies sent to patients PCP when indicated.  Previous H&P reviewed, patient examined, there are NO CHANGES.  [name] LOUIS8/1/201710:55 AM

## 2016-05-31 DIAGNOSIS — E782 Mixed hyperlipidemia: Secondary | ICD-10-CM | POA: Diagnosis not present

## 2016-05-31 DIAGNOSIS — I10 Essential (primary) hypertension: Secondary | ICD-10-CM | POA: Diagnosis not present

## 2016-05-31 DIAGNOSIS — E039 Hypothyroidism, unspecified: Secondary | ICD-10-CM | POA: Diagnosis not present

## 2016-05-31 DIAGNOSIS — R739 Hyperglycemia, unspecified: Secondary | ICD-10-CM | POA: Diagnosis not present

## 2016-05-31 DIAGNOSIS — Z79899 Other long term (current) drug therapy: Secondary | ICD-10-CM | POA: Diagnosis not present

## 2016-06-06 ENCOUNTER — Encounter: Payer: Self-pay | Admitting: *Deleted

## 2016-06-07 DIAGNOSIS — E782 Mixed hyperlipidemia: Secondary | ICD-10-CM | POA: Diagnosis not present

## 2016-06-07 DIAGNOSIS — R079 Chest pain, unspecified: Secondary | ICD-10-CM | POA: Diagnosis not present

## 2016-06-07 DIAGNOSIS — E039 Hypothyroidism, unspecified: Secondary | ICD-10-CM | POA: Diagnosis not present

## 2016-06-07 DIAGNOSIS — I10 Essential (primary) hypertension: Secondary | ICD-10-CM | POA: Diagnosis not present

## 2016-06-07 DIAGNOSIS — R739 Hyperglycemia, unspecified: Secondary | ICD-10-CM | POA: Diagnosis not present

## 2016-06-12 ENCOUNTER — Ambulatory Visit: Payer: Commercial Managed Care - HMO | Admitting: Certified Registered"

## 2016-06-12 ENCOUNTER — Ambulatory Visit
Admission: RE | Admit: 2016-06-12 | Discharge: 2016-06-12 | Disposition: A | Discharge status: Home / Self Care | Payer: Commercial Managed Care - HMO | Source: Ambulatory Visit | Attending: Ophthalmology | Admitting: Ophthalmology

## 2016-06-12 ENCOUNTER — Encounter: Payer: Self-pay | Admitting: *Deleted

## 2016-06-12 ENCOUNTER — Encounter
Admission: RE | Disposition: A | Discharge status: Home / Self Care | Payer: Self-pay | Source: Ambulatory Visit | Attending: Ophthalmology

## 2016-06-12 DIAGNOSIS — Z87891 Personal history of nicotine dependence: Secondary | ICD-10-CM | POA: Diagnosis not present

## 2016-06-12 DIAGNOSIS — E039 Hypothyroidism, unspecified: Secondary | ICD-10-CM | POA: Insufficient documentation

## 2016-06-12 DIAGNOSIS — H2511 Age-related nuclear cataract, right eye: Secondary | ICD-10-CM | POA: Insufficient documentation

## 2016-06-12 DIAGNOSIS — K219 Gastro-esophageal reflux disease without esophagitis: Secondary | ICD-10-CM | POA: Diagnosis not present

## 2016-06-12 DIAGNOSIS — M1990 Unspecified osteoarthritis, unspecified site: Secondary | ICD-10-CM | POA: Insufficient documentation

## 2016-06-12 DIAGNOSIS — I10 Essential (primary) hypertension: Secondary | ICD-10-CM | POA: Diagnosis not present

## 2016-06-12 HISTORY — PX: CATARACT EXTRACTION W/PHACO: SHX586

## 2016-06-12 SURGERY — PHACOEMULSIFICATION, CATARACT, WITH IOL INSERTION
Anesthesia: Monitor Anesthesia Care | Site: Eye | Laterality: Right | Wound class: Clean

## 2016-06-12 MED ORDER — POVIDONE-IODINE 5 % OP SOLN
1.0000 "application " | Freq: Once | OPHTHALMIC | Status: AC
  Administered 2016-06-12: 1 via OPHTHALMIC

## 2016-06-12 MED ORDER — MOXIFLOXACIN HCL 0.5 % OP SOLN
OPHTHALMIC | Status: DC | PRN
  Administered 2016-06-12: 1 [drp] via OPHTHALMIC

## 2016-06-12 MED ORDER — FENTANYL CITRATE (PF) 100 MCG/2ML IJ SOLN
INTRAMUSCULAR | Status: DC | PRN
  Administered 2016-06-12: 50 ug via INTRAVENOUS

## 2016-06-12 MED ORDER — POVIDONE-IODINE 5 % OP SOLN
OPHTHALMIC | Status: AC
  Filled 2016-06-12: qty 30

## 2016-06-12 MED ORDER — SODIUM CHLORIDE 0.9 % IV SOLN
INTRAVENOUS | Status: DC
  Administered 2016-06-12: 11:00:00 via INTRAVENOUS

## 2016-06-12 MED ORDER — EPINEPHRINE HCL 1 MG/ML IJ SOLN
INTRAOCULAR | Status: DC | PRN
  Administered 2016-06-12: 200 mL via OPHTHALMIC

## 2016-06-12 MED ORDER — TETRACAINE HCL 0.5 % OP SOLN
OPHTHALMIC | Status: AC
  Filled 2016-06-12: qty 2

## 2016-06-12 MED ORDER — NA CHONDROIT SULF-NA HYALURON 40-17 MG/ML IO SOLN
INTRAOCULAR | Status: AC
Start: 2016-06-12 — End: 2016-06-12
  Filled 2016-06-12: qty 1

## 2016-06-12 MED ORDER — ARMC OPHTHALMIC DILATING GEL
OPHTHALMIC | Status: AC
  Administered 2016-06-12: 1 via OPHTHALMIC
  Filled 2016-06-12: qty 0.25

## 2016-06-12 MED ORDER — ARMC OPHTHALMIC DILATING GEL
1.0000 "application " | OPHTHALMIC | Status: AC | PRN
  Administered 2016-06-12 (×2): 1 via OPHTHALMIC

## 2016-06-12 MED ORDER — LIDOCAINE HCL (PF) 4 % IJ SOLN
INTRAOCULAR | Status: DC | PRN
  Administered 2016-06-12: 4 mL via OPHTHALMIC

## 2016-06-12 MED ORDER — MIDAZOLAM HCL 2 MG/2ML IJ SOLN
INTRAMUSCULAR | Status: DC | PRN
  Administered 2016-06-12: 1 mg via INTRAVENOUS

## 2016-06-12 MED ORDER — NA CHONDROIT SULF-NA HYALURON 40-17 MG/ML IO SOLN
INTRAOCULAR | Status: DC | PRN
  Administered 2016-06-12: 1 mL via INTRAOCULAR

## 2016-06-12 MED ORDER — CEFUROXIME OPHTHALMIC INJECTION 1 MG/0.1 ML
INJECTION | OPHTHALMIC | Status: DC | PRN
  Administered 2016-06-12: 1 mg via INTRACAMERAL

## 2016-06-12 MED ORDER — TETRACAINE HCL 0.5 % OP SOLN
1.0000 [drp] | Freq: Once | OPHTHALMIC | Status: AC
  Administered 2016-06-12: 1 [drp] via OPHTHALMIC

## 2016-06-12 MED ORDER — CEFUROXIME OPHTHALMIC INJECTION 1 MG/0.1 ML
INJECTION | OPHTHALMIC | Status: AC
  Filled 2016-06-12: qty 0.1

## 2016-06-12 MED ORDER — MOXIFLOXACIN HCL 0.5 % OP SOLN: 1.0000 [drp] | OPHTHALMIC | Status: DC | PRN

## 2016-06-12 MED ORDER — EPINEPHRINE HCL 1 MG/ML IJ SOLN
INTRAMUSCULAR | Status: AC
  Filled 2016-06-12: qty 2

## 2016-06-12 MED ORDER — MOXIFLOXACIN HCL 0.5 % OP SOLN
OPHTHALMIC | Status: AC
  Filled 2016-06-12: qty 3

## 2016-06-12 MED ORDER — CARBACHOL 0.01 % IO SOLN
INTRAOCULAR | Status: DC | PRN
  Administered 2016-06-12: 0.5 mL via INTRAOCULAR

## 2016-06-12 SURGICAL SUPPLY — 24 items
CANNULA ANT/CHMB 27GA (MISCELLANEOUS) ×3 IMPLANT
CUP MEDICINE 2OZ PLAST GRAD ST (MISCELLANEOUS) ×3 IMPLANT
GLOVE BIO SURGEON STRL SZ8 (GLOVE) ×3 IMPLANT
GLOVE BIOGEL M 6.5 STRL (GLOVE) ×3 IMPLANT
GLOVE SURG LX 8.0 MICRO (GLOVE) ×2
GLOVE SURG LX STRL 8.0 MICRO (GLOVE) ×1 IMPLANT
GOWN STRL REUS W/ TWL LRG LVL3 (GOWN DISPOSABLE) ×2 IMPLANT
GOWN STRL REUS W/TWL LRG LVL3 (GOWN DISPOSABLE) ×4
LENS IOL ACRSF IQ TRC 5 26.0 (Intraocular Lens) ×1 IMPLANT
LENS IOL ACRYSOF IQ TORIC 26.0 (Intraocular Lens) ×2 IMPLANT
LENS IOL IQ TORIC 5 26.0 (Intraocular Lens) ×1 IMPLANT
MARKER SKIN DUAL TIP RULER LAB (MISCELLANEOUS) ×3 IMPLANT
PACK CATARACT (MISCELLANEOUS) ×3 IMPLANT
PACK CATARACT BRASINGTON LX (MISCELLANEOUS) ×3 IMPLANT
PACK EYE AFTER SURG (MISCELLANEOUS) ×3 IMPLANT
SOL BSS BAG (MISCELLANEOUS) ×3
SOL PREP PVP 2OZ (MISCELLANEOUS) ×3
SOLUTION BSS BAG (MISCELLANEOUS) ×1 IMPLANT
SOLUTION PREP PVP 2OZ (MISCELLANEOUS) ×1 IMPLANT
SYR 3ML LL SCALE MARK (SYRINGE) ×3 IMPLANT
SYR 5ML LL (SYRINGE) ×3 IMPLANT
SYR TB 1ML 27GX1/2 LL (SYRINGE) ×3 IMPLANT
WATER STERILE IRR 250ML POUR (IV SOLUTION) ×3 IMPLANT
WIPE NON LINTING 3.25X3.25 (MISCELLANEOUS) ×3 IMPLANT

## 2016-06-12 NOTE — Op Note (Signed)
PREOPERATIVE DIAGNOSIS:  Nuclear sclerotic cataract of the right eye.   POSTOPERATIVE DIAGNOSIS:  Nuclear sclerotic cataract of the right eye.   OPERATIVE PROCEDURE:  Procedure(s): CATARACT EXTRACTION PHACO AND INTRAOCULAR LENS PLACEMENT (IOC)   SURGEON:  Birder Robson, MD.   ANESTHESIA: 1.      Managed anesthesia care. 2.      Topical tetracaine drops followed by 2% Xylocaine jelly applied in the preoperative holding area.  Anesthesiologist: Gunnar Fusi, MD CRNA: Rolla Plate, CRNA   COMPLICATIONS:  None.   TECHNIQUE:   Stop and chop    DESCRIPTION OF PROCEDURE:  The patient was examined and consented in the preoperative holding area where the aforementioned topical anesthesia was applied to the right eye.  The patient was brought back to the Operating Room where he was sat upright on the gurney and given a target to fixate upon while the eye was marked at the 3:00 and 9:00 position.  The patient was then reclined on the operating table.  The eye was prepped and draped in the usual sterile ophthalmic fashion and a lid speculum was placed. A paracentesis was created with the side port blade and the anterior chamber was filled with viscoelastic. A near clear corneal incision was performed with the steel keratome. A continuous curvilinear capsulorrhexis was performed with a cystotome followed by the capsulorrhexis forceps. Hydrodissection and hydrodelineation were carried out with BSS on a blunt cannula. The lens was removed in a stop and chop technique and the remaining cortical material was removed with the irrigation-aspiration handpiece. The eye was inflated with viscoelastic and the ZCT lens  was placed in the eye and rotated to within a few degrees of the predetermined orientation.  The remaining viscoelastic was removed from the eye.  The Sinskey hook was used to rotate the toric lens into its final resting place at 093 degrees.  0.1 mL of cefuroxime concentration 10 mg/mL was  placed in the anterior chamber. The eye was inflated to a physiologic pressure and found to be watertight.  The eye was dressed with Vigamox. The patient was given protective glasses to wear throughout the day and a shield with which to sleep tonight. The patient was also given drops with which to begin a drop regimen today and will follow-up with me in one day.  Implant Name Type Inv. Item Serial No. Manufacturer Lot No. LRB No. Used  LENS IOL TORIC 26.0 - [id number] Intraocular Lens LENS IOL TORIC 26.0 [id number] ALCON   Right 1   Procedure(s) with comments: CATARACT EXTRACTION PHACO AND INTRAOCULAR LENS PLACEMENT (IOC) (Right) - Lot# [id number] H [place]: 1:05.5 AP%:45.8 CDE:12.87  Electronically signed: [place] 06/12/2016 12:59 PM

## 2016-06-12 NOTE — Transfer of Care (Signed)
Immediate Anesthesia Transfer of Care Note  Patient: Charlene Thomas  Procedure(s) Performed: Procedure(s) with comments: CATARACT EXTRACTION PHACO AND INTRAOCULAR LENS PLACEMENT (IOC) (Right) - Lot# [id number] H [place]: 1:05.5 AP%:45.8 CDE:12.87  Patient Location: Short Stay  Anesthesia Type:MAC  Level of Consciousness: awake  Airway & Oxygen Therapy: Patient Spontanous Breathing  Post-op Assessment: Report given to RN  Post vital signs: Reviewed  Last Vitals:  Vitals:   06/12/16 1108 06/12/16 1301  BP: 133/90 (!) 117/103  Pulse: 64 65  Resp: 16 18  Temp: 36.6 C 36.6 C    Last Pain:  Vitals:   06/12/16 1108  TempSrc: Oral         Complications: No apparent anesthesia complications

## 2016-06-12 NOTE — Anesthesia Preprocedure Evaluation (Signed)
Anesthesia Evaluation    Airway Mallampati: III       Dental   Pulmonary former smoker,           Cardiovascular hypertension, Pt. on medications + angina + dysrhythmias      Neuro/Psych    GI/Hepatic GERD  Medicated,  Endo/Other  Hypothyroidism   Renal/GU      Musculoskeletal  (+) Arthritis , Osteoarthritis,    Abdominal   Peds  Hematology   Anesthesia Other Findings   Reproductive/Obstetrics                             Anesthesia Physical Anesthesia Plan  ASA: III  Anesthesia Plan: MAC   Post-op Pain Management:    Induction: Intravenous  Airway Management Planned:   Additional Equipment:   Intra-op Plan:   Post-operative Plan:   Informed Consent: I have reviewed the patients History and Physical, chart, labs and discussed the procedure including the risks, benefits and alternatives for the proposed anesthesia with the patient or authorized representative who has indicated his/her understanding and acceptance.     Plan Discussed with:   Anesthesia Plan Comments:         Anesthesia Quick Evaluation

## 2016-06-12 NOTE — H&P (Signed)
  All labs reviewed. Abnormal studies sent to patients PCP when indicated.  Previous H&P reviewed, patient examined, there are NO CHANGES.  Charlene Thomas LOUIS8/29/201712:24 PM

## 2016-06-12 NOTE — Anesthesia Postprocedure Evaluation (Signed)
Anesthesia Post Note  Patient: Charlene Thomas  Procedure(s) Performed: Procedure(s) (LRB): CATARACT EXTRACTION PHACO AND INTRAOCULAR LENS PLACEMENT (IOC) (Right)  Patient location during evaluation: Short Stay Anesthesia Type: MAC Level of consciousness: awake and alert Pain management: pain level controlled Vital Signs Assessment: post-procedure vital signs reviewed and stable Respiratory status: spontaneous breathing Cardiovascular status: stable Postop Assessment: no headache Anesthetic complications: no    Last Vitals:  Vitals:   06/12/16 1108 06/12/16 1301  BP: 133/90 (!) 117/103  Pulse: 64 65  Resp: 16 18  Temp: 36.6 C 36.6 C    Last Pain:  Vitals:   06/12/16 1108  TempSrc: Oral                 Brantley Fling

## 2016-06-12 NOTE — Discharge Instructions (Signed)

## 2016-06-22 DIAGNOSIS — I471 Supraventricular tachycardia: Secondary | ICD-10-CM | POA: Diagnosis not present

## 2016-06-22 DIAGNOSIS — E782 Mixed hyperlipidemia: Secondary | ICD-10-CM | POA: Diagnosis not present

## 2016-06-22 DIAGNOSIS — I10 Essential (primary) hypertension: Secondary | ICD-10-CM | POA: Diagnosis not present

## 2016-06-22 DIAGNOSIS — I208 Other forms of angina pectoris: Secondary | ICD-10-CM | POA: Diagnosis not present

## 2016-06-22 DIAGNOSIS — Z8679 Personal history of other diseases of the circulatory system: Secondary | ICD-10-CM | POA: Diagnosis not present

## 2016-06-27 DIAGNOSIS — I208 Other forms of angina pectoris: Secondary | ICD-10-CM | POA: Diagnosis not present

## 2016-06-27 IMAGING — CT CT ANGIO CHEST
1 of 12 series · 10 of 38 positions shown · IV contrast (APPLIED)
Comparison: None.

CLINICAL DATA: Stabbing like chest pain and difficulty rib

EXAM:
CT ANGIOGRAPHY CHEST WITH CONTRAST
TECHNIQUE: Multidetector CT imaging of the chest was performed using the
standard protocol during bolus administration of intravenous
contrast. Multiplanar CT image reconstructions and MIPs were
obtained to evaluate the vascular anatomy.
CONTRAST:  175 mL Omnipaque 350 nonionic

[Series 6: pe thins 1.5 · axial · 0.54mm/px · z∈[-504,-334]mm · 10 of 139 slices shown]
[im 13/139  lung]
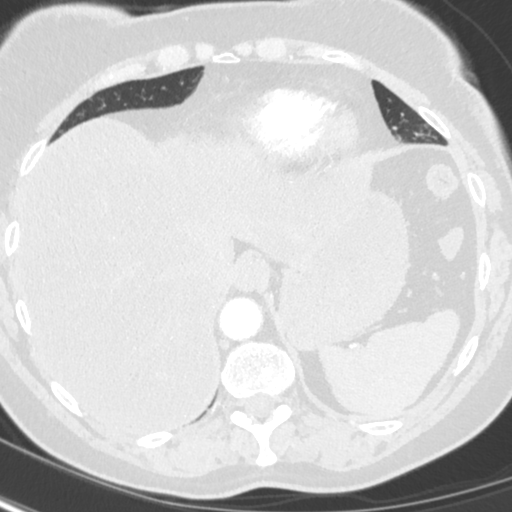
[im 26/139  mediastinal]
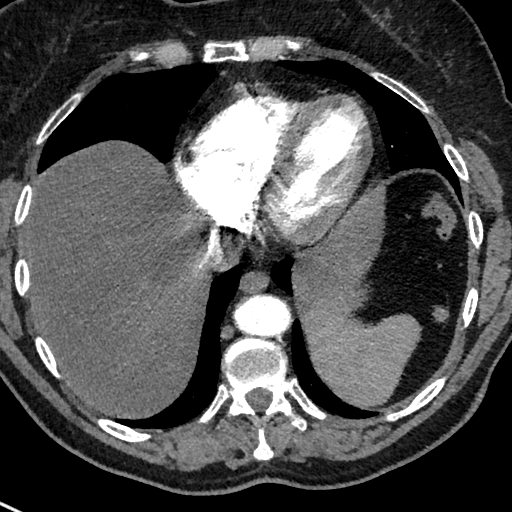
[im 38/139  lung]
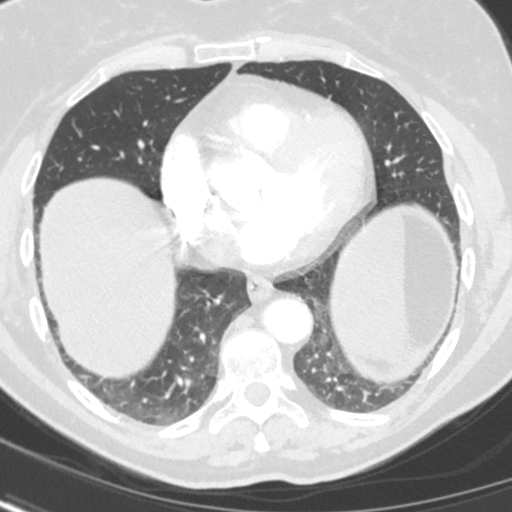
[im 51/139  mediastinal]
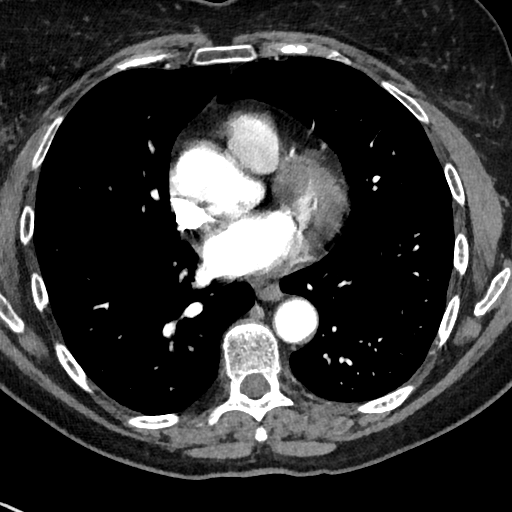
[im 63/139  lung]
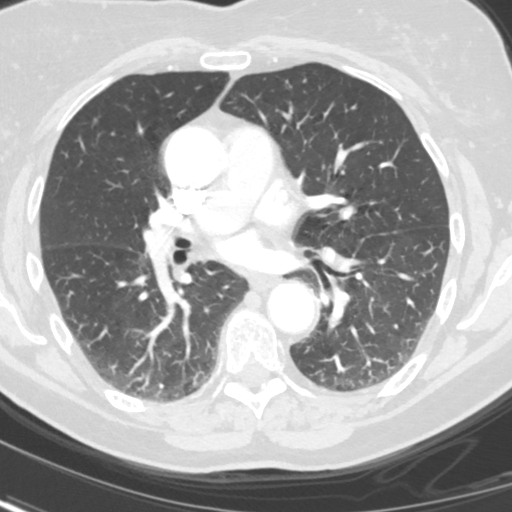
[im 76/139  mediastinal]
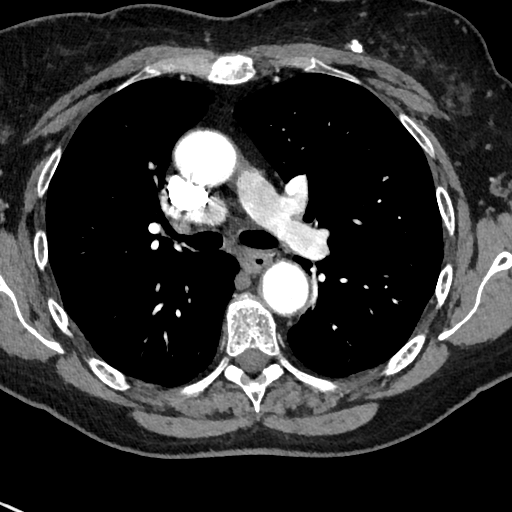
[im 88/139  lung]
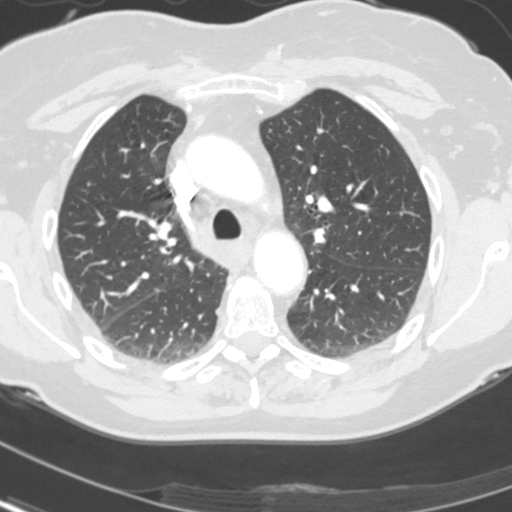
[im 101/139  mediastinal]
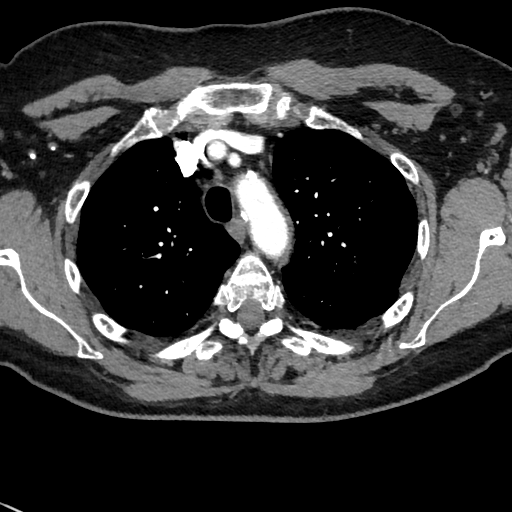
[im 113/139  lung]
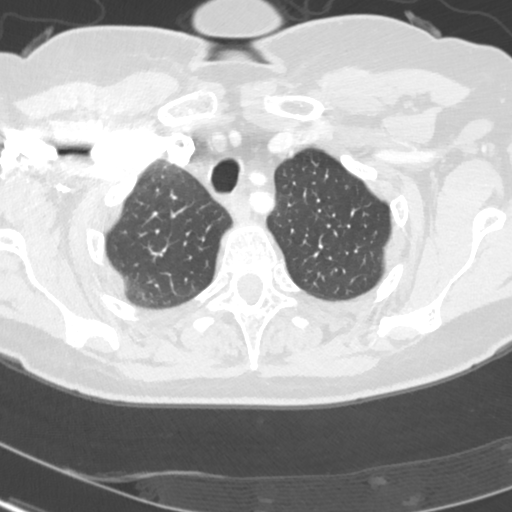
[im 126/139  mediastinal]
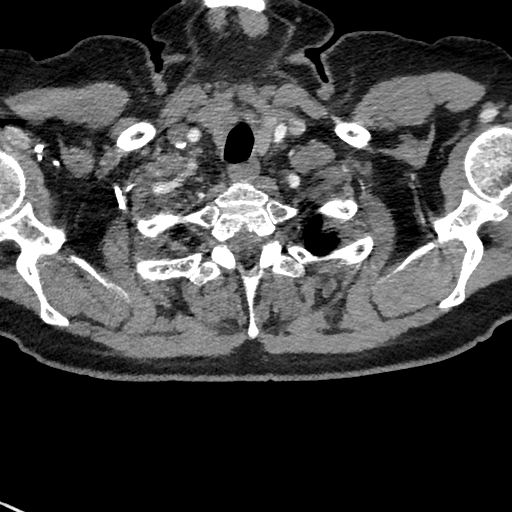

[10 of 38 positions shown; findings below may reference images not displayed]

FINDINGS: There is no demonstrable pulmonary embolus. There is no thoracic
aortic aneurysm or dissection.

There is slight bibasilar lung atelectatic change. Elsewhere, lungs
are clear. There are a few scattered subcentimeter lymph nodes in
the chest. There is a focal lymph node just to the left of the
distal trachea anteriorly measuring 1.5 x 0.9 cm.

Pericardium is not thickened. There is left ventricular hypertrophy.

In the visualized upper abdomen, there is hepatic steatosis.

There are no blastic or lytic bone lesions.  Thyroid appears normal.

Review of the MIP images confirms the above findings.
IMPRESSION: No demonstrable pulmonary embolus. No edema or consolidation.
Borderline prominent lymph node just anterior to the distal trachea
on the left. Left ventricular hypertrophy present. There is hepatic
steatosis.

## 2016-07-02 DIAGNOSIS — I10 Essential (primary) hypertension: Secondary | ICD-10-CM | POA: Diagnosis not present

## 2016-07-02 DIAGNOSIS — I471 Supraventricular tachycardia: Secondary | ICD-10-CM | POA: Diagnosis not present

## 2016-07-02 DIAGNOSIS — E782 Mixed hyperlipidemia: Secondary | ICD-10-CM | POA: Diagnosis not present

## 2016-07-02 DIAGNOSIS — R0602 Shortness of breath: Secondary | ICD-10-CM | POA: Diagnosis not present

## 2016-07-12 DIAGNOSIS — I471 Supraventricular tachycardia: Secondary | ICD-10-CM | POA: Diagnosis not present

## 2016-07-12 DIAGNOSIS — Z23 Encounter for immunization: Secondary | ICD-10-CM | POA: Diagnosis not present

## 2016-07-12 DIAGNOSIS — I10 Essential (primary) hypertension: Secondary | ICD-10-CM | POA: Diagnosis not present

## 2016-07-12 DIAGNOSIS — R739 Hyperglycemia, unspecified: Secondary | ICD-10-CM | POA: Diagnosis not present

## 2016-07-12 DIAGNOSIS — E782 Mixed hyperlipidemia: Secondary | ICD-10-CM | POA: Diagnosis not present

## 2016-08-01 DIAGNOSIS — L282 Other prurigo: Secondary | ICD-10-CM | POA: Diagnosis not present

## 2016-09-10 DIAGNOSIS — H209 Unspecified iridocyclitis: Secondary | ICD-10-CM | POA: Diagnosis not present

## 2016-09-14 DIAGNOSIS — H209 Unspecified iridocyclitis: Secondary | ICD-10-CM | POA: Diagnosis not present

## 2016-09-20 DIAGNOSIS — Z961 Presence of intraocular lens: Secondary | ICD-10-CM | POA: Diagnosis not present

## 2016-10-24 DIAGNOSIS — E782 Mixed hyperlipidemia: Secondary | ICD-10-CM | POA: Diagnosis not present

## 2016-10-24 DIAGNOSIS — I10 Essential (primary) hypertension: Secondary | ICD-10-CM | POA: Diagnosis not present

## 2016-10-24 DIAGNOSIS — R0602 Shortness of breath: Secondary | ICD-10-CM | POA: Diagnosis not present

## 2016-11-09 DIAGNOSIS — H209 Unspecified iridocyclitis: Secondary | ICD-10-CM | POA: Diagnosis not present

## 2016-11-19 DIAGNOSIS — H209 Unspecified iridocyclitis: Secondary | ICD-10-CM | POA: Diagnosis not present

## 2016-11-21 DIAGNOSIS — E7800 Pure hypercholesterolemia, unspecified: Secondary | ICD-10-CM | POA: Diagnosis not present

## 2016-11-21 DIAGNOSIS — Z79899 Other long term (current) drug therapy: Secondary | ICD-10-CM | POA: Diagnosis not present

## 2016-11-21 DIAGNOSIS — R7309 Other abnormal glucose: Secondary | ICD-10-CM | POA: Diagnosis not present

## 2016-11-21 DIAGNOSIS — E039 Hypothyroidism, unspecified: Secondary | ICD-10-CM | POA: Diagnosis not present

## 2016-11-28 DIAGNOSIS — Z1211 Encounter for screening for malignant neoplasm of colon: Secondary | ICD-10-CM | POA: Diagnosis not present

## 2016-11-28 DIAGNOSIS — E782 Mixed hyperlipidemia: Secondary | ICD-10-CM | POA: Diagnosis not present

## 2016-11-28 DIAGNOSIS — Z0000 Encounter for general adult medical examination without abnormal findings: Secondary | ICD-10-CM | POA: Diagnosis not present

## 2016-11-28 DIAGNOSIS — E039 Hypothyroidism, unspecified: Secondary | ICD-10-CM | POA: Diagnosis not present

## 2016-11-28 DIAGNOSIS — I471 Supraventricular tachycardia: Secondary | ICD-10-CM | POA: Diagnosis not present

## 2016-11-28 DIAGNOSIS — R739 Hyperglycemia, unspecified: Secondary | ICD-10-CM | POA: Diagnosis not present

## 2016-11-28 DIAGNOSIS — I10 Essential (primary) hypertension: Secondary | ICD-10-CM | POA: Diagnosis not present

## 2016-11-28 DIAGNOSIS — Z1231 Encounter for screening mammogram for malignant neoplasm of breast: Secondary | ICD-10-CM | POA: Diagnosis not present

## 2016-11-28 DIAGNOSIS — Z79899 Other long term (current) drug therapy: Secondary | ICD-10-CM | POA: Diagnosis not present

## 2017-01-19 ENCOUNTER — Encounter: Payer: Self-pay | Admitting: Emergency Medicine

## 2017-01-19 ENCOUNTER — Emergency Department: Payer: Medicare HMO

## 2017-01-19 ENCOUNTER — Emergency Department
Admission: EM | Admit: 2017-01-19 | Discharge: 2017-01-19 | Disposition: A | Discharge status: Home / Self Care | Payer: Medicare HMO | Attending: Emergency Medicine | Admitting: Emergency Medicine

## 2017-01-19 DIAGNOSIS — R229 Localized swelling, mass and lump, unspecified: Secondary | ICD-10-CM

## 2017-01-19 DIAGNOSIS — D1722 Benign lipomatous neoplasm of skin and subcutaneous tissue of left arm: Secondary | ICD-10-CM | POA: Diagnosis not present

## 2017-01-19 DIAGNOSIS — M79602 Pain in left arm: Secondary | ICD-10-CM | POA: Diagnosis not present

## 2017-01-19 DIAGNOSIS — Z79899 Other long term (current) drug therapy: Secondary | ICD-10-CM | POA: Insufficient documentation

## 2017-01-19 DIAGNOSIS — M79632 Pain in left forearm: Secondary | ICD-10-CM | POA: Diagnosis not present

## 2017-01-19 DIAGNOSIS — D1779 Benign lipomatous neoplasm of other sites: Secondary | ICD-10-CM

## 2017-01-19 DIAGNOSIS — Z87891 Personal history of nicotine dependence: Secondary | ICD-10-CM | POA: Diagnosis not present

## 2017-01-19 DIAGNOSIS — M7989 Other specified soft tissue disorders: Secondary | ICD-10-CM | POA: Diagnosis not present

## 2017-01-19 DIAGNOSIS — I10 Essential (primary) hypertension: Secondary | ICD-10-CM | POA: Diagnosis not present

## 2017-01-19 DIAGNOSIS — D1720 Benign lipomatous neoplasm of skin and subcutaneous tissue of unspecified limb: Secondary | ICD-10-CM

## 2017-01-19 DIAGNOSIS — E039 Hypothyroidism, unspecified: Secondary | ICD-10-CM | POA: Diagnosis not present

## 2017-01-19 NOTE — Discharge Instructions (Signed)
Your exam and ultrasound are normal at this time. You may have a small lipoma under the skin. Continue to monitor symptoms, and follow-up with Dr. Doy Hutching for ongoing symptom management or possible MRI if pain continues.

## 2017-01-19 NOTE — ED Triage Notes (Signed)
Patient states yesterday she has a sharp pain in L ear which lasted about 5 minutes. Has not returned. States she also has a palpable lump in L arm which was painful. States now is not painful either.. States this is intermittent.

## 2017-01-19 NOTE — ED Provider Notes (Signed)
[facility] Emergency Department Provider Note ____________________________________________  Time seen: 1129  I have reviewed the triage vital signs and the nursing notes.  HISTORY  Chief Complaint  Arm Pain   HPI Charlene Thomas is a 71 y.o. female presents to the ED for evaluation of intermittent pain and swelling to the left forearm. She describes 2 days of pain, swelling, and mild redness to the inside of the left forearm. She denies injury, trauma, contusion, or recent needle draws. She notes the pain is currently resolved, but she still notes some swelling "like a balloon" under the skin of the forearm. She denies distal paresthesias, grip changes, or skin temp/color changes. She applied some Aspercreme which helped with pain last night.   Past Medical History:  Diagnosis Date  . Anginal pain ([place])   . Arthritis   . Dysrhythmia    TACHYCARDIA  . GERD (gastroesophageal reflux disease)   . Heart palpitations   . Hypertension   . Hypothyroidism     There are no active problems to display for this patient.   Past Surgical History:  Procedure Laterality Date  . ABDOMINAL HYSTERECTOMY    . CATARACT EXTRACTION W/PHACO Left 05/15/2016   Procedure: CATARACT EXTRACTION PHACO AND INTRAOCULAR LENS PLACEMENT (IOC);  Surgeon: Birder Robson, MD;  Location: [facility] ORS;  Service: Ophthalmology;  Laterality: Left;  [place] 00:34 AP% 20.1 CDE 6.98 Fluid pack lot # [id number]  . CATARACT EXTRACTION W/PHACO Right 06/12/2016   Procedure: CATARACT EXTRACTION PHACO AND INTRAOCULAR LENS PLACEMENT (IOC);  Surgeon: Birder Robson, MD;  Location: [facility] ORS;  Service: Ophthalmology;  Laterality: Right;  Lot# [id number] H [place]: 1:05.5 AP%:45.8 CDE:12.87  . CESAREAN SECTION     X 3  . CHOLECYSTECTOMY    . GRAFTS     SKIN GRAFTS FOR BURNS  . KIDNEY STONE SURGERY    . THROAT SURGERY      Prior to Admission medications   Medication Sig Start Date End Date Taking? Authorizing  Provider  diltiazem (CARDIZEM CD) 120 MG 24 hr capsule Take 120 mg by mouth daily. 02/20/16   Historical Provider, MD  levothyroxine (SYNTHROID, LEVOTHROID) 75 MCG tablet take 1 tablet by mouth daily 04/16/16   Historical Provider, MD  losartan (COZAAR) 50 MG tablet Take 50 mg by mouth every morning. 04/16/16   Historical Provider, MD  metoprolol (LOPRESSOR) 50 MG tablet TAKE 1 TABLET (50 MG TOTAL) BY MOUTH 2 (TWO) TIMES DAILY. 04/28/16   Historical Provider, MD  omeprazole (PRILOSEC) 20 MG capsule Take 20 mg by mouth 2 (two) times daily. 02/19/16   Historical Provider, MD  pravastatin (PRAVACHOL) 40 MG tablet Take 40 mg by mouth daily. 04/18/16   Historical Provider, MD    Allergies Aspirin and Mobic [meloxicam]  Family History  Problem Relation Age of Onset  . Breast cancer Neg Hx     Social History Social History  Substance Use Topics  . Smoking status: Former Research scientist (life sciences)  . Smokeless tobacco: Never Used  . Alcohol use No    Review of Systems  Constitutional: Negative for fever. Cardiovascular: Negative for chest pain. Respiratory: Negative for shortness of breath. Musculoskeletal: Negative for back pain. LUE swelling & pain as above.  Skin: Negative for rash. Neurological: Negative for headaches, focal weakness or numbness. ____________________________________________  PHYSICAL EXAM:  VITAL SIGNS: ED Triage Vitals  Enc Vitals Group     BP 01/19/17 1057 (!) 132/97     Pulse Rate 01/19/17 1057 90     Resp  01/19/17 1057 18     Temp 01/19/17 1057 98.3 F (36.8 C)     Temp Source 01/19/17 1057 Oral     SpO2 01/19/17 1057 96 %     Weight 01/19/17 1058 166 lb (75.3 kg)     Height 01/19/17 1058 5\' 1"  (1.549 m)     Head Circumference --      Peak Flow --      Pain Score 01/19/17 1057 0     Pain Loc --      Pain Edu? --      Excl. in [place]? --     Constitutional: Alert and oriented. Well appearing and in no distress. Head: Normocephalic and atraumatic. Eyes: Conjunctivae are normal.  PERRL. Normal extraocular movements Hematological/Lymphatic/Immunological: No axillary or epitrochlear lymphadenopathy. Cardiovascular: Normal rate, regular rhythm. Normal distal pulses. Respiratory: Normal respiratory effort. No wheezes/rales/rhonchi. Musculoskeletal: Normal composite fist. Normal forearm pronation/supination. Nontender with normal range of motion in all extremities.  Neurologic: CN II-XII grossly intact. Normal intrinsic & opposition testing of the LUE. Normal gait without ataxia. Normal speech and language. No gross focal neurologic deficits are appreciated. Skin:  Skin is warm, dry and intact. No rash noted. LUE with slight erythema noted superficially to the proximal volar forearm. Patient with a subcutaneous, soft, mobile, cyst-like lesion to the proximal volar forearm.  ____________________________________________   RADIOLOGY  [place] LUE  IMPRESSION: No significant ultrasound abnormality is demonstrated. If symptoms persist or worsen MRI may be helpful for further evaluation. ____________________________________________  INITIAL IMPRESSION / ASSESSMENT AND PLAN / ED COURSE  Patient with LUE pain, now resolved, with a normal exam and ultrasound. She may have a small lipoma to the subcutaneous volar forearm.  She is reassured by her exam and imaging. She will continue her daily meds and OTC pain meds as needed. Follow-up with Dr. Doy Hutching for continued symptoms.  ____________________________________________  FINAL CLINICAL IMPRESSION(S) / ED DIAGNOSES  Final diagnoses:  Lipoma of extremity  Left arm pain      Melvenia Needles, PA-C 01/19/17 [place], MD 01/19/17 1645

## 2017-01-21 DIAGNOSIS — H209 Unspecified iridocyclitis: Secondary | ICD-10-CM | POA: Diagnosis not present

## 2017-02-21 ENCOUNTER — Other Ambulatory Visit: Payer: Self-pay | Admitting: Internal Medicine

## 2017-02-21 DIAGNOSIS — Z1231 Encounter for screening mammogram for malignant neoplasm of breast: Secondary | ICD-10-CM

## 2017-03-19 ENCOUNTER — Ambulatory Visit
Admission: RE | Admit: 2017-03-19 | Discharge: 2017-03-19 | Disposition: A | Discharge status: Home / Self Care | Payer: Medicare HMO | Source: Ambulatory Visit | Attending: Internal Medicine | Admitting: Internal Medicine

## 2017-03-19 DIAGNOSIS — Z1231 Encounter for screening mammogram for malignant neoplasm of breast: Secondary | ICD-10-CM | POA: Diagnosis not present

## 2017-03-25 DIAGNOSIS — R0602 Shortness of breath: Secondary | ICD-10-CM | POA: Diagnosis not present

## 2017-03-25 DIAGNOSIS — I10 Essential (primary) hypertension: Secondary | ICD-10-CM | POA: Diagnosis not present

## 2017-03-25 DIAGNOSIS — E782 Mixed hyperlipidemia: Secondary | ICD-10-CM | POA: Diagnosis not present

## 2017-03-25 DIAGNOSIS — I471 Supraventricular tachycardia: Secondary | ICD-10-CM | POA: Diagnosis not present

## 2017-03-25 DIAGNOSIS — R079 Chest pain, unspecified: Secondary | ICD-10-CM | POA: Diagnosis not present

## 2017-05-21 DIAGNOSIS — E782 Mixed hyperlipidemia: Secondary | ICD-10-CM | POA: Diagnosis not present

## 2017-05-21 DIAGNOSIS — E039 Hypothyroidism, unspecified: Secondary | ICD-10-CM | POA: Diagnosis not present

## 2017-05-21 DIAGNOSIS — R739 Hyperglycemia, unspecified: Secondary | ICD-10-CM | POA: Diagnosis not present

## 2017-05-21 DIAGNOSIS — N390 Urinary tract infection, site not specified: Secondary | ICD-10-CM | POA: Diagnosis not present

## 2017-05-21 DIAGNOSIS — I10 Essential (primary) hypertension: Secondary | ICD-10-CM | POA: Diagnosis not present

## 2017-05-21 DIAGNOSIS — Z79899 Other long term (current) drug therapy: Secondary | ICD-10-CM | POA: Diagnosis not present

## 2017-06-07 DIAGNOSIS — E782 Mixed hyperlipidemia: Secondary | ICD-10-CM | POA: Diagnosis not present

## 2017-06-07 DIAGNOSIS — Z1211 Encounter for screening for malignant neoplasm of colon: Secondary | ICD-10-CM | POA: Diagnosis not present

## 2017-06-07 DIAGNOSIS — E039 Hypothyroidism, unspecified: Secondary | ICD-10-CM | POA: Diagnosis not present

## 2017-06-07 DIAGNOSIS — I10 Essential (primary) hypertension: Secondary | ICD-10-CM | POA: Diagnosis not present

## 2017-06-07 DIAGNOSIS — R739 Hyperglycemia, unspecified: Secondary | ICD-10-CM | POA: Diagnosis not present

## 2017-07-16 DIAGNOSIS — R739 Hyperglycemia, unspecified: Secondary | ICD-10-CM | POA: Diagnosis not present

## 2017-07-16 DIAGNOSIS — Z1211 Encounter for screening for malignant neoplasm of colon: Secondary | ICD-10-CM | POA: Diagnosis not present

## 2017-07-16 DIAGNOSIS — E039 Hypothyroidism, unspecified: Secondary | ICD-10-CM | POA: Diagnosis not present

## 2017-07-16 DIAGNOSIS — E782 Mixed hyperlipidemia: Secondary | ICD-10-CM | POA: Diagnosis not present

## 2017-07-16 DIAGNOSIS — I10 Essential (primary) hypertension: Secondary | ICD-10-CM | POA: Diagnosis not present

## 2017-08-01 DIAGNOSIS — Z23 Encounter for immunization: Secondary | ICD-10-CM | POA: Diagnosis not present

## 2017-09-02 DIAGNOSIS — E039 Hypothyroidism, unspecified: Secondary | ICD-10-CM | POA: Diagnosis not present

## 2017-09-09 DIAGNOSIS — M1990 Unspecified osteoarthritis, unspecified site: Secondary | ICD-10-CM | POA: Diagnosis not present

## 2017-09-09 DIAGNOSIS — R1084 Generalized abdominal pain: Secondary | ICD-10-CM | POA: Diagnosis not present

## 2017-09-09 DIAGNOSIS — R739 Hyperglycemia, unspecified: Secondary | ICD-10-CM | POA: Diagnosis not present

## 2017-09-09 DIAGNOSIS — E782 Mixed hyperlipidemia: Secondary | ICD-10-CM | POA: Diagnosis not present

## 2017-09-09 DIAGNOSIS — E039 Hypothyroidism, unspecified: Secondary | ICD-10-CM | POA: Diagnosis not present

## 2017-09-09 DIAGNOSIS — I10 Essential (primary) hypertension: Secondary | ICD-10-CM | POA: Diagnosis not present

## 2017-09-09 DIAGNOSIS — Z79899 Other long term (current) drug therapy: Secondary | ICD-10-CM | POA: Diagnosis not present

## 2017-09-09 DIAGNOSIS — M791 Myalgia, unspecified site: Secondary | ICD-10-CM | POA: Diagnosis not present

## 2017-09-09 DIAGNOSIS — R0789 Other chest pain: Secondary | ICD-10-CM | POA: Diagnosis not present

## 2017-09-10 ENCOUNTER — Other Ambulatory Visit: Payer: Self-pay | Admitting: Internal Medicine

## 2017-09-10 DIAGNOSIS — R1084 Generalized abdominal pain: Secondary | ICD-10-CM

## 2017-09-10 DIAGNOSIS — R1013 Epigastric pain: Secondary | ICD-10-CM

## 2017-10-02 DIAGNOSIS — E782 Mixed hyperlipidemia: Secondary | ICD-10-CM | POA: Diagnosis not present

## 2017-10-02 DIAGNOSIS — I10 Essential (primary) hypertension: Secondary | ICD-10-CM | POA: Diagnosis not present

## 2017-10-02 DIAGNOSIS — R0602 Shortness of breath: Secondary | ICD-10-CM | POA: Diagnosis not present

## 2017-10-02 DIAGNOSIS — I471 Supraventricular tachycardia: Secondary | ICD-10-CM | POA: Diagnosis not present

## 2017-10-14 DIAGNOSIS — M542 Cervicalgia: Secondary | ICD-10-CM | POA: Diagnosis not present

## 2017-10-14 DIAGNOSIS — M5412 Radiculopathy, cervical region: Secondary | ICD-10-CM | POA: Diagnosis not present

## 2017-10-18 ENCOUNTER — Ambulatory Visit
Admission: RE | Admit: 2017-10-18 | Discharge: 2017-10-18 | Disposition: A | Discharge status: Home / Self Care | Payer: Medicare HMO | Source: Ambulatory Visit | Attending: Internal Medicine | Admitting: Internal Medicine

## 2017-10-18 DIAGNOSIS — R1084 Generalized abdominal pain: Secondary | ICD-10-CM

## 2017-10-18 DIAGNOSIS — R109 Unspecified abdominal pain: Secondary | ICD-10-CM | POA: Diagnosis not present

## 2017-10-18 DIAGNOSIS — Z9049 Acquired absence of other specified parts of digestive tract: Secondary | ICD-10-CM | POA: Diagnosis not present

## 2017-10-18 DIAGNOSIS — K219 Gastro-esophageal reflux disease without esophagitis: Secondary | ICD-10-CM | POA: Diagnosis not present

## 2017-10-18 DIAGNOSIS — R1013 Epigastric pain: Secondary | ICD-10-CM

## 2018-03-14 DIAGNOSIS — H1013 Acute atopic conjunctivitis, bilateral: Secondary | ICD-10-CM | POA: Diagnosis not present

## 2018-03-26 DIAGNOSIS — R202 Paresthesia of skin: Secondary | ICD-10-CM | POA: Diagnosis not present

## 2018-04-02 DIAGNOSIS — I10 Essential (primary) hypertension: Secondary | ICD-10-CM | POA: Diagnosis not present

## 2018-04-02 DIAGNOSIS — I471 Supraventricular tachycardia: Secondary | ICD-10-CM | POA: Diagnosis not present

## 2018-04-02 DIAGNOSIS — E782 Mixed hyperlipidemia: Secondary | ICD-10-CM | POA: Diagnosis not present

## 2018-06-04 DIAGNOSIS — M542 Cervicalgia: Secondary | ICD-10-CM | POA: Diagnosis not present

## 2018-06-27 DIAGNOSIS — E782 Mixed hyperlipidemia: Secondary | ICD-10-CM | POA: Diagnosis not present

## 2018-06-27 DIAGNOSIS — Z79899 Other long term (current) drug therapy: Secondary | ICD-10-CM | POA: Diagnosis not present

## 2018-06-27 DIAGNOSIS — N390 Urinary tract infection, site not specified: Secondary | ICD-10-CM | POA: Diagnosis not present

## 2018-06-27 DIAGNOSIS — E039 Hypothyroidism, unspecified: Secondary | ICD-10-CM | POA: Diagnosis not present

## 2018-06-27 DIAGNOSIS — Z1239 Encounter for other screening for malignant neoplasm of breast: Secondary | ICD-10-CM | POA: Diagnosis not present

## 2018-06-27 DIAGNOSIS — R739 Hyperglycemia, unspecified: Secondary | ICD-10-CM | POA: Diagnosis not present

## 2018-06-27 DIAGNOSIS — I10 Essential (primary) hypertension: Secondary | ICD-10-CM | POA: Diagnosis not present

## 2018-06-30 DIAGNOSIS — Z1239 Encounter for other screening for malignant neoplasm of breast: Secondary | ICD-10-CM | POA: Diagnosis not present

## 2018-06-30 DIAGNOSIS — Z79899 Other long term (current) drug therapy: Secondary | ICD-10-CM | POA: Diagnosis not present

## 2018-06-30 DIAGNOSIS — E039 Hypothyroidism, unspecified: Secondary | ICD-10-CM | POA: Diagnosis not present

## 2018-06-30 DIAGNOSIS — N390 Urinary tract infection, site not specified: Secondary | ICD-10-CM | POA: Diagnosis not present

## 2018-06-30 DIAGNOSIS — R739 Hyperglycemia, unspecified: Secondary | ICD-10-CM | POA: Diagnosis not present

## 2018-06-30 DIAGNOSIS — E782 Mixed hyperlipidemia: Secondary | ICD-10-CM | POA: Diagnosis not present

## 2018-06-30 DIAGNOSIS — I10 Essential (primary) hypertension: Secondary | ICD-10-CM | POA: Diagnosis not present

## 2018-07-15 ENCOUNTER — Other Ambulatory Visit: Payer: Self-pay | Admitting: Physician Assistant

## 2018-07-15 ENCOUNTER — Ambulatory Visit
Admission: RE | Admit: 2018-07-15 | Discharge: 2018-07-15 | Disposition: A | Discharge status: Home / Self Care | Payer: Medicare HMO | Source: Ambulatory Visit | Attending: Physician Assistant | Admitting: Physician Assistant

## 2018-07-15 DIAGNOSIS — M79662 Pain in left lower leg: Secondary | ICD-10-CM | POA: Diagnosis not present

## 2018-07-15 DIAGNOSIS — M79605 Pain in left leg: Secondary | ICD-10-CM

## 2018-07-15 DIAGNOSIS — Z23 Encounter for immunization: Secondary | ICD-10-CM | POA: Diagnosis not present

## 2018-09-04 DIAGNOSIS — E782 Mixed hyperlipidemia: Secondary | ICD-10-CM | POA: Diagnosis not present

## 2018-09-04 DIAGNOSIS — I10 Essential (primary) hypertension: Secondary | ICD-10-CM | POA: Diagnosis not present

## 2018-09-04 DIAGNOSIS — M791 Myalgia, unspecified site: Secondary | ICD-10-CM | POA: Diagnosis not present

## 2018-09-04 DIAGNOSIS — R739 Hyperglycemia, unspecified: Secondary | ICD-10-CM | POA: Diagnosis not present

## 2018-09-04 DIAGNOSIS — M79605 Pain in left leg: Secondary | ICD-10-CM | POA: Diagnosis not present

## 2018-09-04 DIAGNOSIS — E039 Hypothyroidism, unspecified: Secondary | ICD-10-CM | POA: Diagnosis not present

## 2018-10-14 DIAGNOSIS — E782 Mixed hyperlipidemia: Secondary | ICD-10-CM | POA: Diagnosis not present

## 2018-10-14 DIAGNOSIS — I471 Supraventricular tachycardia: Secondary | ICD-10-CM | POA: Diagnosis not present

## 2018-10-14 DIAGNOSIS — I10 Essential (primary) hypertension: Secondary | ICD-10-CM | POA: Diagnosis not present

## 2018-10-14 DIAGNOSIS — M79605 Pain in left leg: Secondary | ICD-10-CM | POA: Diagnosis not present

## 2019-01-02 ENCOUNTER — Other Ambulatory Visit: Payer: Self-pay | Admitting: Internal Medicine

## 2019-01-02 ENCOUNTER — Other Ambulatory Visit: Payer: Self-pay

## 2019-01-02 ENCOUNTER — Ambulatory Visit
Admission: RE | Admit: 2019-01-02 | Discharge: 2019-01-02 | Disposition: A | Discharge status: Home / Self Care | Payer: Medicare HMO | Source: Ambulatory Visit | Attending: Internal Medicine | Admitting: Internal Medicine

## 2019-01-02 DIAGNOSIS — M7989 Other specified soft tissue disorders: Secondary | ICD-10-CM

## 2019-04-10 DIAGNOSIS — I10 Essential (primary) hypertension: Secondary | ICD-10-CM | POA: Diagnosis not present

## 2019-04-10 DIAGNOSIS — R0789 Other chest pain: Secondary | ICD-10-CM | POA: Diagnosis not present

## 2019-04-10 DIAGNOSIS — E782 Mixed hyperlipidemia: Secondary | ICD-10-CM | POA: Diagnosis not present

## 2019-04-10 DIAGNOSIS — I471 Supraventricular tachycardia: Secondary | ICD-10-CM | POA: Diagnosis not present

## 2019-06-19 DIAGNOSIS — H43813 Vitreous degeneration, bilateral: Secondary | ICD-10-CM | POA: Diagnosis not present

## 2019-07-09 DIAGNOSIS — Z23 Encounter for immunization: Secondary | ICD-10-CM | POA: Diagnosis not present

## 2019-07-23 DIAGNOSIS — R739 Hyperglycemia, unspecified: Secondary | ICD-10-CM | POA: Diagnosis not present

## 2019-07-23 DIAGNOSIS — Z79899 Other long term (current) drug therapy: Secondary | ICD-10-CM | POA: Diagnosis not present

## 2019-07-23 DIAGNOSIS — E039 Hypothyroidism, unspecified: Secondary | ICD-10-CM | POA: Diagnosis not present

## 2019-07-30 DIAGNOSIS — E782 Mixed hyperlipidemia: Secondary | ICD-10-CM | POA: Diagnosis not present

## 2019-07-30 DIAGNOSIS — R739 Hyperglycemia, unspecified: Secondary | ICD-10-CM | POA: Diagnosis not present

## 2019-07-30 DIAGNOSIS — Z79899 Other long term (current) drug therapy: Secondary | ICD-10-CM | POA: Diagnosis not present

## 2019-07-30 DIAGNOSIS — Z87891 Personal history of nicotine dependence: Secondary | ICD-10-CM | POA: Diagnosis not present

## 2019-07-30 DIAGNOSIS — E039 Hypothyroidism, unspecified: Secondary | ICD-10-CM | POA: Diagnosis not present

## 2019-07-30 DIAGNOSIS — I10 Essential (primary) hypertension: Secondary | ICD-10-CM | POA: Diagnosis not present

## 2019-09-15 DIAGNOSIS — Z79899 Other long term (current) drug therapy: Secondary | ICD-10-CM | POA: Diagnosis not present

## 2019-09-15 DIAGNOSIS — E782 Mixed hyperlipidemia: Secondary | ICD-10-CM | POA: Diagnosis not present

## 2019-09-15 DIAGNOSIS — E039 Hypothyroidism, unspecified: Secondary | ICD-10-CM | POA: Diagnosis not present

## 2019-10-14 DIAGNOSIS — E782 Mixed hyperlipidemia: Secondary | ICD-10-CM | POA: Diagnosis not present

## 2019-10-14 DIAGNOSIS — I10 Essential (primary) hypertension: Secondary | ICD-10-CM | POA: Diagnosis not present

## 2019-10-14 DIAGNOSIS — I471 Supraventricular tachycardia: Secondary | ICD-10-CM | POA: Diagnosis not present

## 2019-11-02 DIAGNOSIS — Z0000 Encounter for general adult medical examination without abnormal findings: Secondary | ICD-10-CM | POA: Diagnosis not present

## 2019-11-02 DIAGNOSIS — M549 Dorsalgia, unspecified: Secondary | ICD-10-CM | POA: Diagnosis not present

## 2019-11-02 DIAGNOSIS — I10 Essential (primary) hypertension: Secondary | ICD-10-CM | POA: Diagnosis not present

## 2019-11-02 DIAGNOSIS — R109 Unspecified abdominal pain: Secondary | ICD-10-CM | POA: Diagnosis not present

## 2019-11-02 DIAGNOSIS — R739 Hyperglycemia, unspecified: Secondary | ICD-10-CM | POA: Diagnosis not present

## 2019-11-02 DIAGNOSIS — E079 Disorder of thyroid, unspecified: Secondary | ICD-10-CM | POA: Diagnosis not present

## 2020-02-24 DIAGNOSIS — E039 Hypothyroidism, unspecified: Secondary | ICD-10-CM | POA: Diagnosis not present

## 2020-02-24 DIAGNOSIS — I471 Supraventricular tachycardia: Secondary | ICD-10-CM | POA: Diagnosis not present

## 2020-02-24 DIAGNOSIS — I10 Essential (primary) hypertension: Secondary | ICD-10-CM | POA: Diagnosis not present

## 2020-02-24 DIAGNOSIS — Z87891 Personal history of nicotine dependence: Secondary | ICD-10-CM | POA: Diagnosis not present

## 2020-02-24 DIAGNOSIS — Z79899 Other long term (current) drug therapy: Secondary | ICD-10-CM | POA: Diagnosis not present

## 2020-02-24 DIAGNOSIS — E782 Mixed hyperlipidemia: Secondary | ICD-10-CM | POA: Diagnosis not present

## 2020-02-24 DIAGNOSIS — R102 Pelvic and perineal pain: Secondary | ICD-10-CM | POA: Diagnosis not present

## 2020-02-26 DIAGNOSIS — N764 Abscess of vulva: Secondary | ICD-10-CM | POA: Diagnosis not present

## 2020-02-26 DIAGNOSIS — N9089 Other specified noninflammatory disorders of vulva and perineum: Secondary | ICD-10-CM | POA: Diagnosis not present

## 2020-02-26 DIAGNOSIS — R102 Pelvic and perineal pain: Secondary | ICD-10-CM | POA: Diagnosis not present

## 2020-05-20 DIAGNOSIS — E782 Mixed hyperlipidemia: Secondary | ICD-10-CM | POA: Diagnosis not present

## 2020-05-20 DIAGNOSIS — E039 Hypothyroidism, unspecified: Secondary | ICD-10-CM | POA: Diagnosis not present

## 2020-05-20 DIAGNOSIS — I10 Essential (primary) hypertension: Secondary | ICD-10-CM | POA: Diagnosis not present

## 2020-05-20 DIAGNOSIS — Z79899 Other long term (current) drug therapy: Secondary | ICD-10-CM | POA: Diagnosis not present

## 2020-05-27 DIAGNOSIS — Z79899 Other long term (current) drug therapy: Secondary | ICD-10-CM | POA: Diagnosis not present

## 2020-05-27 DIAGNOSIS — E039 Hypothyroidism, unspecified: Secondary | ICD-10-CM | POA: Diagnosis not present

## 2020-05-27 DIAGNOSIS — I471 Supraventricular tachycardia: Secondary | ICD-10-CM | POA: Diagnosis not present

## 2020-05-27 DIAGNOSIS — I10 Essential (primary) hypertension: Secondary | ICD-10-CM | POA: Diagnosis not present

## 2020-05-27 DIAGNOSIS — E782 Mixed hyperlipidemia: Secondary | ICD-10-CM | POA: Diagnosis not present

## 2020-05-27 DIAGNOSIS — R739 Hyperglycemia, unspecified: Secondary | ICD-10-CM | POA: Diagnosis not present

## 2020-07-21 DIAGNOSIS — Z23 Encounter for immunization: Secondary | ICD-10-CM | POA: Diagnosis not present

## 2020-08-24 DIAGNOSIS — Z79899 Other long term (current) drug therapy: Secondary | ICD-10-CM | POA: Diagnosis not present

## 2020-08-24 DIAGNOSIS — I10 Essential (primary) hypertension: Secondary | ICD-10-CM | POA: Diagnosis not present

## 2020-08-24 DIAGNOSIS — E782 Mixed hyperlipidemia: Secondary | ICD-10-CM | POA: Diagnosis not present

## 2020-08-24 DIAGNOSIS — R739 Hyperglycemia, unspecified: Secondary | ICD-10-CM | POA: Diagnosis not present

## 2020-08-24 DIAGNOSIS — E039 Hypothyroidism, unspecified: Secondary | ICD-10-CM | POA: Diagnosis not present

## 2020-08-31 DIAGNOSIS — G8929 Other chronic pain: Secondary | ICD-10-CM | POA: Diagnosis not present

## 2020-08-31 DIAGNOSIS — E039 Hypothyroidism, unspecified: Secondary | ICD-10-CM | POA: Diagnosis not present

## 2020-08-31 DIAGNOSIS — I10 Essential (primary) hypertension: Secondary | ICD-10-CM | POA: Diagnosis not present

## 2020-08-31 DIAGNOSIS — E782 Mixed hyperlipidemia: Secondary | ICD-10-CM | POA: Diagnosis not present

## 2020-08-31 DIAGNOSIS — R739 Hyperglycemia, unspecified: Secondary | ICD-10-CM | POA: Diagnosis not present

## 2020-08-31 DIAGNOSIS — M5441 Lumbago with sciatica, right side: Secondary | ICD-10-CM | POA: Diagnosis not present

## 2020-08-31 DIAGNOSIS — M545 Low back pain, unspecified: Secondary | ICD-10-CM | POA: Diagnosis not present

## 2020-08-31 DIAGNOSIS — M25551 Pain in right hip: Secondary | ICD-10-CM | POA: Diagnosis not present

## 2020-08-31 DIAGNOSIS — M549 Dorsalgia, unspecified: Secondary | ICD-10-CM | POA: Diagnosis not present

## 2020-12-07 DIAGNOSIS — M3501 Sicca syndrome with keratoconjunctivitis: Secondary | ICD-10-CM | POA: Diagnosis not present

## 2020-12-16 DIAGNOSIS — E079 Disorder of thyroid, unspecified: Secondary | ICD-10-CM | POA: Diagnosis not present

## 2020-12-16 DIAGNOSIS — Z79899 Other long term (current) drug therapy: Secondary | ICD-10-CM | POA: Diagnosis not present

## 2020-12-16 DIAGNOSIS — Z0000 Encounter for general adult medical examination without abnormal findings: Secondary | ICD-10-CM | POA: Diagnosis not present

## 2020-12-16 DIAGNOSIS — M542 Cervicalgia: Secondary | ICD-10-CM | POA: Diagnosis not present

## 2020-12-16 DIAGNOSIS — E785 Hyperlipidemia, unspecified: Secondary | ICD-10-CM | POA: Diagnosis not present

## 2020-12-16 DIAGNOSIS — I10 Essential (primary) hypertension: Secondary | ICD-10-CM | POA: Diagnosis not present

## 2020-12-16 DIAGNOSIS — R739 Hyperglycemia, unspecified: Secondary | ICD-10-CM | POA: Diagnosis not present

## 2020-12-16 DIAGNOSIS — M5136 Other intervertebral disc degeneration, lumbar region: Secondary | ICD-10-CM | POA: Diagnosis not present

## 2020-12-16 DIAGNOSIS — Z1231 Encounter for screening mammogram for malignant neoplasm of breast: Secondary | ICD-10-CM | POA: Diagnosis not present

## 2020-12-19 DIAGNOSIS — E782 Mixed hyperlipidemia: Secondary | ICD-10-CM | POA: Diagnosis not present

## 2020-12-19 DIAGNOSIS — I6523 Occlusion and stenosis of bilateral carotid arteries: Secondary | ICD-10-CM | POA: Diagnosis not present

## 2020-12-19 DIAGNOSIS — I208 Other forms of angina pectoris: Secondary | ICD-10-CM | POA: Diagnosis not present

## 2020-12-19 DIAGNOSIS — I10 Essential (primary) hypertension: Secondary | ICD-10-CM | POA: Diagnosis not present

## 2020-12-19 DIAGNOSIS — I471 Supraventricular tachycardia: Secondary | ICD-10-CM | POA: Diagnosis not present

## 2020-12-20 ENCOUNTER — Other Ambulatory Visit (HOSPITAL_COMMUNITY): Payer: Self-pay | Admitting: Physical Medicine & Rehabilitation

## 2020-12-20 ENCOUNTER — Other Ambulatory Visit: Payer: Self-pay | Admitting: Physical Medicine & Rehabilitation

## 2020-12-20 DIAGNOSIS — M5441 Lumbago with sciatica, right side: Secondary | ICD-10-CM

## 2020-12-20 DIAGNOSIS — G8929 Other chronic pain: Secondary | ICD-10-CM

## 2020-12-22 ENCOUNTER — Other Ambulatory Visit: Payer: Self-pay | Admitting: Otolaryngology

## 2020-12-22 DIAGNOSIS — R1314 Dysphagia, pharyngoesophageal phase: Secondary | ICD-10-CM | POA: Diagnosis not present

## 2020-12-22 DIAGNOSIS — K219 Gastro-esophageal reflux disease without esophagitis: Secondary | ICD-10-CM | POA: Diagnosis not present

## 2020-12-22 DIAGNOSIS — F458 Other somatoform disorders: Secondary | ICD-10-CM | POA: Diagnosis not present

## 2020-12-22 DIAGNOSIS — R221 Localized swelling, mass and lump, neck: Secondary | ICD-10-CM | POA: Diagnosis not present

## 2020-12-22 DIAGNOSIS — E041 Nontoxic single thyroid nodule: Secondary | ICD-10-CM

## 2020-12-26 ENCOUNTER — Other Ambulatory Visit: Payer: Self-pay | Admitting: Otolaryngology

## 2020-12-26 DIAGNOSIS — R0989 Other specified symptoms and signs involving the circulatory and respiratory systems: Secondary | ICD-10-CM

## 2020-12-26 DIAGNOSIS — R198 Other specified symptoms and signs involving the digestive system and abdomen: Secondary | ICD-10-CM

## 2020-12-26 DIAGNOSIS — K219 Gastro-esophageal reflux disease without esophagitis: Secondary | ICD-10-CM

## 2021-01-03 ENCOUNTER — Ambulatory Visit: Payer: Medicare HMO

## 2021-01-03 DIAGNOSIS — I208 Other forms of angina pectoris: Secondary | ICD-10-CM | POA: Diagnosis not present

## 2021-01-04 ENCOUNTER — Other Ambulatory Visit: Payer: Self-pay

## 2021-01-04 ENCOUNTER — Ambulatory Visit
Admission: RE | Admit: 2021-01-04 | Discharge: 2021-01-04 | Disposition: A | Discharge status: Home / Self Care | Payer: Medicare HMO | Source: Ambulatory Visit | Attending: Physical Medicine & Rehabilitation | Admitting: Physical Medicine & Rehabilitation

## 2021-01-04 ENCOUNTER — Ambulatory Visit: Payer: Medicare HMO

## 2021-01-04 DIAGNOSIS — M5441 Lumbago with sciatica, right side: Secondary | ICD-10-CM | POA: Insufficient documentation

## 2021-01-04 DIAGNOSIS — G8929 Other chronic pain: Secondary | ICD-10-CM | POA: Insufficient documentation

## 2021-01-04 DIAGNOSIS — M545 Low back pain, unspecified: Secondary | ICD-10-CM | POA: Diagnosis not present

## 2021-01-05 ENCOUNTER — Ambulatory Visit
Admission: RE | Admit: 2021-01-05 | Discharge: 2021-01-05 | Disposition: A | Discharge status: Home / Self Care | Payer: Medicare HMO | Source: Ambulatory Visit | Attending: Otolaryngology | Admitting: Otolaryngology

## 2021-01-05 DIAGNOSIS — E041 Nontoxic single thyroid nodule: Secondary | ICD-10-CM | POA: Diagnosis not present

## 2021-01-09 ENCOUNTER — Other Ambulatory Visit: Payer: Self-pay

## 2021-01-09 ENCOUNTER — Ambulatory Visit
Admission: RE | Admit: 2021-01-09 | Discharge: 2021-01-09 | Disposition: A | Discharge status: Home / Self Care | Payer: Medicare HMO | Source: Ambulatory Visit | Attending: Otolaryngology | Admitting: Otolaryngology

## 2021-01-09 DIAGNOSIS — Z0389 Encounter for observation for other suspected diseases and conditions ruled out: Secondary | ICD-10-CM | POA: Diagnosis not present

## 2021-01-09 DIAGNOSIS — R1314 Dysphagia, pharyngoesophageal phase: Secondary | ICD-10-CM | POA: Insufficient documentation

## 2021-01-09 DIAGNOSIS — K219 Gastro-esophageal reflux disease without esophagitis: Secondary | ICD-10-CM | POA: Insufficient documentation

## 2021-01-09 DIAGNOSIS — R1310 Dysphagia, unspecified: Secondary | ICD-10-CM | POA: Diagnosis not present

## 2021-01-09 DIAGNOSIS — R198 Other specified symptoms and signs involving the digestive system and abdomen: Secondary | ICD-10-CM | POA: Diagnosis not present

## 2021-01-09 DIAGNOSIS — R0989 Other specified symptoms and signs involving the circulatory and respiratory systems: Secondary | ICD-10-CM

## 2021-01-09 NOTE — Therapy (Signed)
[place], [place], [address] Phone: [phone]   Fax:     Modified Barium Swallow  Patient Details  Name: Charlene Thomas MRN: [id number] Date of Birth: [birth year] No data recorded  Encounter Date: 01/09/2021   End of Session - 01/09/21 1704    Visit Number 1    Number of Visits 1    Date for SLP Re-Evaluation 01/09/21    Authorization Type mcr    SLP Start Time 1245    SLP Stop Time  1310    SLP Time Calculation (min) 25 min    Activity Tolerance Patient tolerated treatment well           Subjective Assessment - 01/09/21 1703    Subjective "I feel this thing in my throat, and I get strangled."    Currently in Pain? No/denies              Objective Swallowing Evaluation: Type of Study: MBS-Modified Barium Swallow Study   Patient Details  Name: Charlene Thomas MRN: [id number] Date of Birth: [birth year]  Today's Date: 01/09/2021 Time: SLP Start Time (ACUTE ONLY): 6433 -SLP Stop Time (ACUTE ONLY): 1310  SLP Time Calculation (min) (ACUTE ONLY): 25 min   Past Medical History:  Past Medical History:  Diagnosis Date  . Anginal pain ([facility])   . Arthritis   . Dysrhythmia    TACHYCARDIA  . GERD (gastroesophageal reflux disease)   . Heart palpitations   . Hypertension   . Hypothyroidism    Past Surgical History:  Past Surgical History:  Procedure Laterality Date  . ABDOMINAL HYSTERECTOMY    . CATARACT EXTRACTION W/PHACO Left 05/15/2016   Procedure: CATARACT EXTRACTION PHACO AND INTRAOCULAR LENS PLACEMENT (IOC);  Surgeon: Birder Robson, MD;  Location: [facility] ORS;  Service: Ophthalmology;  Laterality: Left;  [place] 00:34 AP% 20.1 CDE 6.98 Fluid pack lot # [id number]  . CATARACT EXTRACTION W/PHACO Right 06/12/2016   Procedure: CATARACT EXTRACTION PHACO AND INTRAOCULAR LENS PLACEMENT (IOC);  Surgeon: Birder Robson, MD;  Location: [facility] ORS;  Service: Ophthalmology;  Laterality: Right;   Lot# [id number] H [place]: 1:05.5 AP%:45.8 CDE:12.87  . CESAREAN SECTION     X 3  . CHOLECYSTECTOMY    . GRAFTS     SKIN GRAFTS FOR BURNS  . KIDNEY STONE SURGERY    . THROAT SURGERY     HPI: Charlene Thomas is a 75 y.o. female with past medical history noted for anginal pain, arthritis, dysrhythmia, GERD, HTN, hypothyroidism referred for MBS by Dr. Pryor Ochoa. Patient complains of globus sensation around thyroid notch; she "strangles" during meals, sometimes with solids and liquids.   No data recorded   Assessment / Plan / Recommendation  CHL IP CLINICAL IMPRESSIONS 01/09/2021  Clinical Impression Patient presents with functional oropharyngeal swallowing and mildly impaired pharyngoesophageal function. Oral stage characterized by adequate containment and control, rotary mastication, and anterior to posterior transfer. Pharyngeal swallow initiation is timely at the level of the base of tongue. Pharyngeal stage characterized by adequate tongue base retraction, hyolaryngeal excursion, and pharyngeal constriction. Epiglottic deflection is complete; pt has full airway protection with no penetration or aspiration observed. Tissue prominence in region of C6 consisent with cricopharyngeal bar; there is mild residue with liquids and solids in the cervical esophageal segment with intermittent backflow through the cricopharyngeus. Pt reported left sided globus sensation at level of thyroid notch during the exam, which did not correlate with any observed pharyngeal residue. Recommend regular diet with  thin liquids, reflux precautions, alternate solids and liquids. Pt provided with handout re: managing GERD symptoms. Consider further esophageal assessment.  SLP Visit Diagnosis Dysphagia, pharyngoesophageal phase (R13.14)  Attention and concentration deficit following --  Frontal lobe and executive function deficit following --  Impact on safety and function Mild aspiration risk      CHL IP TREATMENT RECOMMENDATION  01/09/2021  Treatment Recommendations No treatment recommended at this time     No flowsheet data found.  CHL IP DIET RECOMMENDATION 01/09/2021  SLP Diet Recommendations Regular solids;Thin liquid  Liquid Administration via Cup;Straw  Medication Administration Whole meds with liquid  Compensations Slow rate;Small sips/bites;Follow solids with liquid  Postural Changes Remain semi-upright after after feeds/meals (Comment);Seated upright at 90 degrees      CHL IP OTHER RECOMMENDATIONS 01/09/2021  Recommended Consults Consider esophageal assessment  Oral Care Recommendations Oral care BID  Other Recommendations --      CHL IP FOLLOW UP RECOMMENDATIONS 01/09/2021  Follow up Recommendations Other (comment)      No flowsheet data found.         CHL IP ORAL PHASE 01/09/2021  Oral Phase WFL  Oral - Pudding Teaspoon --  Oral - Pudding Cup --  Oral - Honey Teaspoon --  Oral - Honey Cup --  Oral - Nectar Teaspoon --  Oral - Nectar Cup --  Oral - Nectar Straw --  Oral - Thin Teaspoon --  Oral - Thin Cup --  Oral - Thin Straw --  Oral - Puree --  Oral - Mech Soft --  Oral - Regular --  Oral - Multi-Consistency --  Oral - Pill --  Oral Phase - Comment --    CHL IP PHARYNGEAL PHASE 01/09/2021  Pharyngeal Phase WFL  Pharyngeal- Pudding Teaspoon --  Pharyngeal --  Pharyngeal- Pudding Cup --  Pharyngeal --  Pharyngeal- Honey Teaspoon --  Pharyngeal --  Pharyngeal- Honey Cup --  Pharyngeal --  Pharyngeal- Nectar Teaspoon --  Pharyngeal --  Pharyngeal- Nectar Cup [facility]  Pharyngeal Material does not enter airway  Pharyngeal- Nectar Straw --  Pharyngeal --  Pharyngeal- Thin Teaspoon --  Pharyngeal --  Pharyngeal- Thin Cup [facility]  Pharyngeal Material does not enter airway  Pharyngeal- Thin Straw --  Pharyngeal --  Pharyngeal- Puree WFL  Pharyngeal Material does not enter airway  Pharyngeal- Mechanical Soft WFL  Pharyngeal Material does not enter airway  Pharyngeal- Regular --   Pharyngeal --  Pharyngeal- Multi-consistency --  Pharyngeal --  Pharyngeal- Pill WFL  Pharyngeal Material does not enter airway  Pharyngeal Comment --     CHL IP CERVICAL ESOPHAGEAL PHASE 01/09/2021  Cervical Esophageal Phase Impaired  Pudding Teaspoon --  Pudding Cup --  Honey Teaspoon --  Honey Cup --  Nectar Teaspoon --  Nectar Cup Prominent cricopharyngeal segment  Nectar Straw --  Thin Teaspoon --  Thin Cup Prominent cricopharyngeal segment;Esophageal backflow into cervical esophagus;Esophageal backflow into the pharynx  Thin Straw --  Puree Prominent cricopharyngeal segment;Esophageal backflow into cervical esophagus;Esophageal backflow into the pharynx  Mechanical Soft Prominent cricopharyngeal segment;Esophageal backflow into cervical esophagus;Esophageal backflow into the pharynx  Regular --  Multi-consistency --  Pill Prominent cricopharyngeal segment;Other (Comment)  Cervical Esophageal Comment --   Deneise Lever, MS, CCC-SLP Speech-Language Pathologist   Aliene Altes 01/09/2021, 5:23 PM     There were no vitals filed for this visit.   Globus pharyngeus - Plan: DG SWALLOW FUNC OP MEDICARE SPEECH PATH, DG SWALLOW FUNC OP MEDICARE  SPEECH PATH  Gastroesophageal reflux disease, unspecified whether esophagitis present - Plan: DG SWALLOW FUNC OP MEDICARE SPEECH PATH, DG SWALLOW FUNC OP MEDICARE SPEECH PATH        Problem List There are no problems to display for this patient.   Aliene Altes 01/09/2021, [facility] [place], [place], [address] Phone: [phone]   Fax:     Name: Charlene Thomas MRN: [id number] Date of Birth: [birth year]

## 2021-01-10 DIAGNOSIS — M48062 Spinal stenosis, lumbar region with neurogenic claudication: Secondary | ICD-10-CM | POA: Diagnosis not present

## 2021-01-10 DIAGNOSIS — G8929 Other chronic pain: Secondary | ICD-10-CM | POA: Diagnosis not present

## 2021-01-10 DIAGNOSIS — M5441 Lumbago with sciatica, right side: Secondary | ICD-10-CM | POA: Diagnosis not present

## 2021-01-10 DIAGNOSIS — R944 Abnormal results of kidney function studies: Secondary | ICD-10-CM | POA: Diagnosis not present

## 2021-01-10 DIAGNOSIS — M5442 Lumbago with sciatica, left side: Secondary | ICD-10-CM | POA: Diagnosis not present

## 2021-02-06 DIAGNOSIS — M5442 Lumbago with sciatica, left side: Secondary | ICD-10-CM | POA: Diagnosis not present

## 2021-02-06 DIAGNOSIS — M5441 Lumbago with sciatica, right side: Secondary | ICD-10-CM | POA: Diagnosis not present

## 2021-02-06 DIAGNOSIS — M48062 Spinal stenosis, lumbar region with neurogenic claudication: Secondary | ICD-10-CM | POA: Diagnosis not present

## 2021-02-06 DIAGNOSIS — G8929 Other chronic pain: Secondary | ICD-10-CM | POA: Diagnosis not present

## 2021-02-13 DIAGNOSIS — E785 Hyperlipidemia, unspecified: Secondary | ICD-10-CM | POA: Diagnosis not present

## 2021-02-13 DIAGNOSIS — I10 Essential (primary) hypertension: Secondary | ICD-10-CM | POA: Diagnosis not present

## 2021-02-13 DIAGNOSIS — I771 Stricture of artery: Secondary | ICD-10-CM | POA: Diagnosis not present

## 2021-02-13 DIAGNOSIS — I208 Other forms of angina pectoris: Secondary | ICD-10-CM | POA: Diagnosis not present

## 2021-02-13 DIAGNOSIS — I6523 Occlusion and stenosis of bilateral carotid arteries: Secondary | ICD-10-CM | POA: Diagnosis not present

## 2021-02-14 DIAGNOSIS — R221 Localized swelling, mass and lump, neck: Secondary | ICD-10-CM | POA: Diagnosis not present

## 2021-02-14 DIAGNOSIS — R1314 Dysphagia, pharyngoesophageal phase: Secondary | ICD-10-CM | POA: Diagnosis not present

## 2021-02-14 DIAGNOSIS — F458 Other somatoform disorders: Secondary | ICD-10-CM | POA: Diagnosis not present

## 2021-02-14 DIAGNOSIS — K219 Gastro-esophageal reflux disease without esophagitis: Secondary | ICD-10-CM | POA: Diagnosis not present

## 2021-02-21 DIAGNOSIS — M5442 Lumbago with sciatica, left side: Secondary | ICD-10-CM | POA: Diagnosis not present

## 2021-02-21 DIAGNOSIS — G8929 Other chronic pain: Secondary | ICD-10-CM | POA: Diagnosis not present

## 2021-02-21 DIAGNOSIS — M25561 Pain in right knee: Secondary | ICD-10-CM | POA: Diagnosis not present

## 2021-02-21 DIAGNOSIS — M5441 Lumbago with sciatica, right side: Secondary | ICD-10-CM | POA: Diagnosis not present

## 2021-02-21 DIAGNOSIS — M48062 Spinal stenosis, lumbar region with neurogenic claudication: Secondary | ICD-10-CM | POA: Diagnosis not present

## 2021-02-24 DIAGNOSIS — I6523 Occlusion and stenosis of bilateral carotid arteries: Secondary | ICD-10-CM | POA: Diagnosis not present

## 2021-02-24 DIAGNOSIS — I10 Essential (primary) hypertension: Secondary | ICD-10-CM | POA: Diagnosis not present

## 2021-02-24 DIAGNOSIS — E782 Mixed hyperlipidemia: Secondary | ICD-10-CM | POA: Diagnosis not present

## 2021-02-24 DIAGNOSIS — R0602 Shortness of breath: Secondary | ICD-10-CM | POA: Diagnosis not present

## 2021-02-24 DIAGNOSIS — I208 Other forms of angina pectoris: Secondary | ICD-10-CM | POA: Diagnosis not present

## 2021-03-27 DIAGNOSIS — Z1231 Encounter for screening mammogram for malignant neoplasm of breast: Secondary | ICD-10-CM | POA: Diagnosis not present

## 2021-03-27 DIAGNOSIS — I10 Essential (primary) hypertension: Secondary | ICD-10-CM | POA: Diagnosis not present

## 2021-03-27 DIAGNOSIS — E782 Mixed hyperlipidemia: Secondary | ICD-10-CM | POA: Diagnosis not present

## 2021-03-27 DIAGNOSIS — Z1211 Encounter for screening for malignant neoplasm of colon: Secondary | ICD-10-CM | POA: Diagnosis not present

## 2021-03-27 DIAGNOSIS — R739 Hyperglycemia, unspecified: Secondary | ICD-10-CM | POA: Diagnosis not present

## 2021-03-27 DIAGNOSIS — E039 Hypothyroidism, unspecified: Secondary | ICD-10-CM | POA: Diagnosis not present

## 2021-03-27 DIAGNOSIS — Z79899 Other long term (current) drug therapy: Secondary | ICD-10-CM | POA: Diagnosis not present

## 2021-03-27 DIAGNOSIS — I471 Supraventricular tachycardia: Secondary | ICD-10-CM | POA: Diagnosis not present

## 2021-03-28 ENCOUNTER — Other Ambulatory Visit: Payer: Self-pay | Admitting: Internal Medicine

## 2021-03-28 DIAGNOSIS — Z1231 Encounter for screening mammogram for malignant neoplasm of breast: Secondary | ICD-10-CM

## 2021-04-12 DIAGNOSIS — G8929 Other chronic pain: Secondary | ICD-10-CM | POA: Diagnosis not present

## 2021-04-12 DIAGNOSIS — M5442 Lumbago with sciatica, left side: Secondary | ICD-10-CM | POA: Diagnosis not present

## 2021-04-12 DIAGNOSIS — M25561 Pain in right knee: Secondary | ICD-10-CM | POA: Diagnosis not present

## 2021-04-12 DIAGNOSIS — M5441 Lumbago with sciatica, right side: Secondary | ICD-10-CM | POA: Diagnosis not present

## 2021-04-12 DIAGNOSIS — M48062 Spinal stenosis, lumbar region with neurogenic claudication: Secondary | ICD-10-CM | POA: Diagnosis not present

## 2021-04-14 DIAGNOSIS — M48062 Spinal stenosis, lumbar region with neurogenic claudication: Secondary | ICD-10-CM | POA: Diagnosis not present

## 2021-04-18 ENCOUNTER — Emergency Department (HOSPITAL_COMMUNITY)
Admission: EM | Admit: 2021-04-18 | Discharge: 2021-04-18 | Disposition: A | Discharge status: Home / Self Care | Payer: Medicare HMO | Attending: Emergency Medicine | Admitting: Emergency Medicine

## 2021-04-18 ENCOUNTER — Other Ambulatory Visit: Payer: Self-pay

## 2021-04-18 ENCOUNTER — Emergency Department (HOSPITAL_COMMUNITY): Payer: Medicare HMO

## 2021-04-18 DIAGNOSIS — D72829 Elevated white blood cell count, unspecified: Secondary | ICD-10-CM | POA: Insufficient documentation

## 2021-04-18 DIAGNOSIS — R062 Wheezing: Secondary | ICD-10-CM | POA: Diagnosis not present

## 2021-04-18 DIAGNOSIS — Z79899 Other long term (current) drug therapy: Secondary | ICD-10-CM | POA: Insufficient documentation

## 2021-04-18 DIAGNOSIS — I10 Essential (primary) hypertension: Secondary | ICD-10-CM | POA: Diagnosis not present

## 2021-04-18 DIAGNOSIS — R197 Diarrhea, unspecified: Secondary | ICD-10-CM | POA: Diagnosis not present

## 2021-04-18 DIAGNOSIS — Z9049 Acquired absence of other specified parts of digestive tract: Secondary | ICD-10-CM | POA: Diagnosis not present

## 2021-04-18 DIAGNOSIS — E039 Hypothyroidism, unspecified: Secondary | ICD-10-CM | POA: Insufficient documentation

## 2021-04-18 DIAGNOSIS — R1111 Vomiting without nausea: Secondary | ICD-10-CM | POA: Diagnosis not present

## 2021-04-18 DIAGNOSIS — N3001 Acute cystitis with hematuria: Secondary | ICD-10-CM | POA: Diagnosis not present

## 2021-04-18 DIAGNOSIS — Z87891 Personal history of nicotine dependence: Secondary | ICD-10-CM | POA: Insufficient documentation

## 2021-04-18 DIAGNOSIS — R109 Unspecified abdominal pain: Secondary | ICD-10-CM | POA: Diagnosis not present

## 2021-04-18 DIAGNOSIS — I700 Atherosclerosis of aorta: Secondary | ICD-10-CM | POA: Diagnosis not present

## 2021-04-18 DIAGNOSIS — R6883 Chills (without fever): Secondary | ICD-10-CM | POA: Diagnosis not present

## 2021-04-18 DIAGNOSIS — R1031 Right lower quadrant pain: Secondary | ICD-10-CM | POA: Diagnosis present

## 2021-04-18 LAB — COMPREHENSIVE METABOLIC PANEL
ALT: 36 U/L (ref 0–44)
AST: 71 U/L — ABNORMAL HIGH (ref 15–41)
Albumin: 3.5 g/dL (ref 3.5–5.0)
Alkaline Phosphatase: 89 U/L (ref 38–126)
Anion gap: 11 (ref 5–15)
BUN: 17 mg/dL (ref 8–23)
CO2: 22 mmol/L (ref 22–32)
Calcium: 8.7 mg/dL — ABNORMAL LOW (ref 8.9–10.3)
Chloride: 100 mmol/L (ref 98–111)
Creatinine, Ser: 1.14 mg/dL — ABNORMAL HIGH (ref 0.44–1.00)
GFR, Estimated: 50 mL/min — ABNORMAL LOW (ref >60)
Glucose, Bld: 120 mg/dL — ABNORMAL HIGH (ref 70–99)
Potassium: 3.4 mmol/L — ABNORMAL LOW (ref 3.5–5.1)
Sodium: 133 mmol/L — ABNORMAL LOW (ref 135–145)
Total Bilirubin: 1.2 mg/dL (ref 0.3–1.2)
Total Protein: 6.6 g/dL (ref 6.5–8.1)

## 2021-04-18 LAB — CBC WITH DIFFERENTIAL/PLATELET
Abs Immature Granulocytes: 0.14 10*3/uL — ABNORMAL HIGH (ref 0.00–0.07)
Basophils Absolute: 0.1 10*3/uL (ref 0.0–0.1)
Basophils Relative: 0 %
Eosinophils Absolute: 0 10*3/uL (ref 0.0–0.5)
Eosinophils Relative: 0 %
HCT: 36.7 % (ref 36.0–46.0)
Hemoglobin: 11.7 g/dL — ABNORMAL LOW (ref 12.0–15.0)
Immature Granulocytes: 1 %
Lymphocytes Relative: 5 %
Lymphs Abs: 1 10*3/uL (ref 0.7–4.0)
MCH: 25.5 pg — ABNORMAL LOW (ref 26.0–34.0)
MCHC: 31.9 g/dL (ref 30.0–36.0)
MCV: 80 fL (ref 80.0–100.0)
Monocytes Absolute: 0.7 10*3/uL (ref 0.1–1.0)
Monocytes Relative: 4 %
Neutro Abs: 16.7 10*3/uL — ABNORMAL HIGH (ref 1.7–7.7)
Neutrophils Relative %: 90 %
Platelets: 182 10*3/uL (ref 150–400)
RBC: 4.59 MIL/uL (ref 3.87–5.11)
RDW: 16 % — ABNORMAL HIGH (ref 11.5–15.5)
WBC: 18.5 10*3/uL — ABNORMAL HIGH (ref 4.0–10.5)
nRBC: 0 % (ref 0.0–0.2)

## 2021-04-18 LAB — URINALYSIS, ROUTINE W REFLEX MICROSCOPIC
Bilirubin Urine: NEGATIVE
Glucose, UA: NEGATIVE mg/dL
Ketones, ur: NEGATIVE mg/dL
Nitrite: POSITIVE — AB
Protein, ur: 30 mg/dL — AB
Specific Gravity, Urine: 1.01 (ref 1.005–1.030)
WBC, UA: >50 WBC/hpf — ABNORMAL HIGH (ref 0–5)
pH: 6 (ref 5.0–8.0)

## 2021-04-18 LAB — LIPASE, BLOOD: Lipase: 25 U/L (ref 11–51)

## 2021-04-18 MED ORDER — FENTANYL CITRATE (PF) 100 MCG/2ML IJ SOLN: 50.0000 ug | Freq: Once | INTRAMUSCULAR | Status: DC

## 2021-04-18 MED ORDER — ALBUTEROL SULFATE HFA 108 (90 BASE) MCG/ACT IN AERS
INHALATION_SPRAY | RESPIRATORY_TRACT | Status: AC
  Administered 2021-04-18: 2 via RESPIRATORY_TRACT
  Filled 2021-04-18: qty 6.7

## 2021-04-18 MED ORDER — ONDANSETRON 4 MG PO TBDP: 4.0000 mg | ORAL_TABLET | Freq: Three times a day (TID) | ORAL | 0 refills | Status: DC | PRN

## 2021-04-18 MED ORDER — SODIUM CHLORIDE 0.9 % IV BOLUS
500.0000 mL | Freq: Once | INTRAVENOUS | Status: AC
  Administered 2021-04-18: 500 mL via INTRAVENOUS

## 2021-04-18 MED ORDER — ALBUTEROL SULFATE HFA 108 (90 BASE) MCG/ACT IN AERS
1.0000 | INHALATION_SPRAY | Freq: Once | RESPIRATORY_TRACT | Status: AC
Start: 2021-04-18 — End: 2021-04-18

## 2021-04-18 MED ORDER — SODIUM CHLORIDE 0.9 % IV SOLN
1.0000 g | Freq: Once | INTRAVENOUS | Status: AC
  Administered 2021-04-18: 1 g via INTRAVENOUS
  Filled 2021-04-18: qty 10

## 2021-04-18 MED ORDER — CEPHALEXIN 500 MG PO CAPS: 500.0000 mg | ORAL_CAPSULE | Freq: Four times a day (QID) | ORAL | 0 refills | Status: AC

## 2021-04-18 MED ORDER — ONDANSETRON HCL 4 MG/2ML IJ SOLN: 4.0000 mg | Freq: Once | INTRAMUSCULAR | Status: DC

## 2021-04-18 NOTE — ED Triage Notes (Signed)
Pt had epidural lumbar injection on Friday. Since Sunday, pt incontinent of urine x 1, frequent urination, diarrhea, groin pain, chills, emesis, and delayed responses/confusion.
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
## 2021-04-18 NOTE — Discharge Instructions (Addendum)
Take antibiotics as directed. Please take all of your antibiotics until finished.  Take Zofran for nausea/vomiting.  Return emergency department for any fevers, nausea/vomiting, worsening pain.

## 2021-04-18 NOTE — ED Provider Notes (Signed)
Emergency Medicine Provider Triage Evaluation Note  Charlene Thomas 75 y.o. female was evaluated in triage.  Pt complains of right groin pain, nausea/vomiting.  He had a spinal injection done for days ago.  She reports that 2 days ago, she had some diarrhea, chills.  Today, she has started having some nausea/vomiting.  She reports some pain in the right groin.  She has had some frequent urination and urine has been darker but they have noted any blood.  No dysuria.  She states no back pain.   Review of Systems  Positive: Right groin pain, n/v/d, chills  Negative: Fevers, numbness/weakness   Physical Exam  BP 134/82   Pulse 70   Temp 98.2 F (36.8 C) (Oral)   Resp 18   Ht 5\' 4"  (1.626 m)   Wt 65.8 kg   SpO2 100%   BMI 24.89 kg/m  Gen:   Awake, no distress   HEENT:  Atraumatic  Resp:  Normal effort  Cardiac:  Normal rate  Abd:   Nondistended, tenderness palpation noted to right groin.  No rigidity, guarding.  No CVA tenderness. MSK:   Moves extremities without difficulty  Neuro:  Speech clear   Other:      Medical Decision Making  Medically screening exam initiated at 10:23 AM  Appropriate orders placed.  Charlene Thomas was informed that the remainder of the evaluation will be completed by another provider, this initial triage assessment does not replace that evaluation. They are counseled that they will need to remain in the ED until the completion of their workup, including full H&P and results of any tests.  Risks of leaving the emergency department prior to completion of treatment were discussed. Patient was advised to inform ED staff if they are leaving before their treatment is complete. The patient acknowledged these risks and time was allowed for questions.     The patient appears stable so that the remainder of the MSE may be completed by another provider.    Clinical Impression  N/v   Portions of this note were generated with Dragon dictation software. Dictation  errors may occur despite best attempts at proofreading.     Volanda Napoleon, PA-C 04/18/21 1026    Lucrezia Starch, MD 04/19/21 [phone]

## 2021-04-20 LAB — URINE CULTURE: Culture: >=100000 — AB

## 2021-04-21 ENCOUNTER — Telehealth: Payer: Self-pay | Admitting: Emergency Medicine

## 2021-04-21 DIAGNOSIS — R399 Unspecified symptoms and signs involving the genitourinary system: Secondary | ICD-10-CM | POA: Diagnosis not present

## 2021-04-21 NOTE — Telephone Encounter (Signed)
Post ED Visit - Positive Culture Follow-up  Culture report reviewed by antimicrobial stewardship pharmacist: [place] Team []  Elenor Quinones, Pharm.D. []  Heide Guile, Pharm.D., BCPS AQ-ID []  Parks Neptune, Pharm.D., BCPS []  Alycia Rossetti, Pharm.D., BCPS []  [place], Pharm.D., BCPS, AAHIVP []  Legrand Como, Pharm.D., BCPS, AAHIVP []  Salome Arnt, PharmD, BCPS []  Johnnette Gourd, PharmD, BCPS []  Hughes Better, PharmD, BCPS [x]  Joetta Manners, PharmD []  Laqueta Linden, PharmD, BCPS []  Albertina Parr, PharmD  [place] Team []  Leodis Sias, PharmD []  Lindell Spar, PharmD []  Royetta Asal, PharmD []  Graylin Shiver, Rph []  Rema Fendt) Glennon Mac, PharmD []  Arlyn Dunning, PharmD []  Netta Cedars, PharmD []  Dia Sitter, PharmD []  Leone Haven, PharmD []  Gretta Arab, PharmD []  Theodis Shove, PharmD []  Peggyann Juba, PharmD []  Reuel Boom, PharmD   Positive urine culture Treated with Cephalexin, organism sensitive to the same and no further patient follow-up is required at this time.  Sandi Raveling Cortney Mckinney 04/21/2021, 10:19 AM

## 2021-05-02 DIAGNOSIS — G8929 Other chronic pain: Secondary | ICD-10-CM | POA: Diagnosis not present

## 2021-05-02 DIAGNOSIS — M5441 Lumbago with sciatica, right side: Secondary | ICD-10-CM | POA: Diagnosis not present

## 2021-05-02 DIAGNOSIS — M5442 Lumbago with sciatica, left side: Secondary | ICD-10-CM | POA: Diagnosis not present

## 2021-05-02 DIAGNOSIS — M25561 Pain in right knee: Secondary | ICD-10-CM | POA: Diagnosis not present

## 2021-05-02 DIAGNOSIS — M48062 Spinal stenosis, lumbar region with neurogenic claudication: Secondary | ICD-10-CM | POA: Diagnosis not present

## 2021-05-03 DIAGNOSIS — R399 Unspecified symptoms and signs involving the genitourinary system: Secondary | ICD-10-CM | POA: Diagnosis not present

## 2021-07-12 DIAGNOSIS — Z79899 Other long term (current) drug therapy: Secondary | ICD-10-CM | POA: Diagnosis not present

## 2021-07-12 DIAGNOSIS — E782 Mixed hyperlipidemia: Secondary | ICD-10-CM | POA: Diagnosis not present

## 2021-07-12 DIAGNOSIS — E039 Hypothyroidism, unspecified: Secondary | ICD-10-CM | POA: Diagnosis not present

## 2021-07-12 DIAGNOSIS — R739 Hyperglycemia, unspecified: Secondary | ICD-10-CM | POA: Diagnosis not present

## 2021-07-12 DIAGNOSIS — R300 Dysuria: Secondary | ICD-10-CM | POA: Diagnosis not present

## 2021-07-12 DIAGNOSIS — I10 Essential (primary) hypertension: Secondary | ICD-10-CM | POA: Diagnosis not present

## 2021-07-19 DIAGNOSIS — Z0000 Encounter for general adult medical examination without abnormal findings: Secondary | ICD-10-CM | POA: Diagnosis not present

## 2021-07-19 DIAGNOSIS — Z23 Encounter for immunization: Secondary | ICD-10-CM | POA: Diagnosis not present

## 2021-07-19 DIAGNOSIS — E039 Hypothyroidism, unspecified: Secondary | ICD-10-CM | POA: Diagnosis not present

## 2021-07-19 DIAGNOSIS — E782 Mixed hyperlipidemia: Secondary | ICD-10-CM | POA: Diagnosis not present

## 2021-07-19 DIAGNOSIS — I10 Essential (primary) hypertension: Secondary | ICD-10-CM | POA: Diagnosis not present

## 2021-08-15 DIAGNOSIS — R1314 Dysphagia, pharyngoesophageal phase: Secondary | ICD-10-CM | POA: Diagnosis not present

## 2021-08-15 DIAGNOSIS — H6123 Impacted cerumen, bilateral: Secondary | ICD-10-CM | POA: Diagnosis not present

## 2021-09-13 DIAGNOSIS — J40 Bronchitis, not specified as acute or chronic: Secondary | ICD-10-CM | POA: Diagnosis not present

## 2021-09-13 DIAGNOSIS — Z720 Tobacco use: Secondary | ICD-10-CM | POA: Diagnosis not present

## 2021-09-13 DIAGNOSIS — R739 Hyperglycemia, unspecified: Secondary | ICD-10-CM | POA: Diagnosis not present

## 2021-09-13 DIAGNOSIS — I10 Essential (primary) hypertension: Secondary | ICD-10-CM | POA: Diagnosis not present

## 2021-12-15 DIAGNOSIS — M3501 Sicca syndrome with keratoconjunctivitis: Secondary | ICD-10-CM | POA: Diagnosis not present

## 2022-01-26 DIAGNOSIS — Z0000 Encounter for general adult medical examination without abnormal findings: Secondary | ICD-10-CM | POA: Diagnosis not present

## 2022-01-26 DIAGNOSIS — I10 Essential (primary) hypertension: Secondary | ICD-10-CM | POA: Diagnosis not present

## 2022-01-26 DIAGNOSIS — R739 Hyperglycemia, unspecified: Secondary | ICD-10-CM | POA: Diagnosis not present

## 2022-01-26 DIAGNOSIS — Z79899 Other long term (current) drug therapy: Secondary | ICD-10-CM | POA: Diagnosis not present

## 2022-01-26 DIAGNOSIS — R413 Other amnesia: Secondary | ICD-10-CM | POA: Diagnosis not present

## 2022-01-26 DIAGNOSIS — M549 Dorsalgia, unspecified: Secondary | ICD-10-CM | POA: Diagnosis not present

## 2022-01-26 DIAGNOSIS — E785 Hyperlipidemia, unspecified: Secondary | ICD-10-CM | POA: Diagnosis not present

## 2022-01-26 DIAGNOSIS — E079 Disorder of thyroid, unspecified: Secondary | ICD-10-CM | POA: Diagnosis not present

## 2022-01-26 DIAGNOSIS — R8290 Unspecified abnormal findings in urine: Secondary | ICD-10-CM | POA: Diagnosis not present

## 2022-02-01 DIAGNOSIS — E538 Deficiency of other specified B group vitamins: Secondary | ICD-10-CM | POA: Diagnosis not present

## 2022-02-13 DIAGNOSIS — M48062 Spinal stenosis, lumbar region with neurogenic claudication: Secondary | ICD-10-CM | POA: Diagnosis not present

## 2022-02-13 DIAGNOSIS — M5441 Lumbago with sciatica, right side: Secondary | ICD-10-CM | POA: Diagnosis not present

## 2022-02-13 DIAGNOSIS — M47816 Spondylosis without myelopathy or radiculopathy, lumbar region: Secondary | ICD-10-CM | POA: Diagnosis not present

## 2022-02-13 DIAGNOSIS — M5442 Lumbago with sciatica, left side: Secondary | ICD-10-CM | POA: Diagnosis not present

## 2022-02-13 DIAGNOSIS — G8929 Other chronic pain: Secondary | ICD-10-CM | POA: Diagnosis not present

## 2022-02-14 DIAGNOSIS — R0602 Shortness of breath: Secondary | ICD-10-CM | POA: Diagnosis not present

## 2022-02-14 DIAGNOSIS — I6523 Occlusion and stenosis of bilateral carotid arteries: Secondary | ICD-10-CM | POA: Diagnosis not present

## 2022-02-14 DIAGNOSIS — I10 Essential (primary) hypertension: Secondary | ICD-10-CM | POA: Diagnosis not present

## 2022-02-14 DIAGNOSIS — E782 Mixed hyperlipidemia: Secondary | ICD-10-CM | POA: Diagnosis not present

## 2022-03-01 DIAGNOSIS — M47816 Spondylosis without myelopathy or radiculopathy, lumbar region: Secondary | ICD-10-CM | POA: Diagnosis not present

## 2022-03-06 DIAGNOSIS — M5442 Lumbago with sciatica, left side: Secondary | ICD-10-CM | POA: Diagnosis not present

## 2022-03-06 DIAGNOSIS — M48062 Spinal stenosis, lumbar region with neurogenic claudication: Secondary | ICD-10-CM | POA: Diagnosis not present

## 2022-03-06 DIAGNOSIS — G8929 Other chronic pain: Secondary | ICD-10-CM | POA: Diagnosis not present

## 2022-03-06 DIAGNOSIS — M47816 Spondylosis without myelopathy or radiculopathy, lumbar region: Secondary | ICD-10-CM | POA: Diagnosis not present

## 2022-03-06 DIAGNOSIS — M5441 Lumbago with sciatica, right side: Secondary | ICD-10-CM | POA: Diagnosis not present

## 2022-03-23 DIAGNOSIS — M5441 Lumbago with sciatica, right side: Secondary | ICD-10-CM | POA: Diagnosis not present

## 2022-03-23 DIAGNOSIS — G8929 Other chronic pain: Secondary | ICD-10-CM | POA: Diagnosis not present

## 2022-03-23 DIAGNOSIS — M5442 Lumbago with sciatica, left side: Secondary | ICD-10-CM | POA: Diagnosis not present

## 2022-04-05 DIAGNOSIS — M5442 Lumbago with sciatica, left side: Secondary | ICD-10-CM | POA: Diagnosis not present

## 2022-04-05 DIAGNOSIS — G8929 Other chronic pain: Secondary | ICD-10-CM | POA: Diagnosis not present

## 2022-04-05 DIAGNOSIS — M5441 Lumbago with sciatica, right side: Secondary | ICD-10-CM | POA: Diagnosis not present

## 2022-04-25 ENCOUNTER — Ambulatory Visit: Payer: Medicare HMO | Admitting: Dermatology

## 2022-07-16 DIAGNOSIS — Z23 Encounter for immunization: Secondary | ICD-10-CM | POA: Diagnosis not present

## 2022-08-16 DIAGNOSIS — Z79899 Other long term (current) drug therapy: Secondary | ICD-10-CM | POA: Diagnosis not present

## 2022-08-16 DIAGNOSIS — E039 Hypothyroidism, unspecified: Secondary | ICD-10-CM | POA: Diagnosis not present

## 2022-08-16 DIAGNOSIS — I10 Essential (primary) hypertension: Secondary | ICD-10-CM | POA: Diagnosis not present

## 2022-08-16 DIAGNOSIS — E782 Mixed hyperlipidemia: Secondary | ICD-10-CM | POA: Diagnosis not present

## 2022-08-16 DIAGNOSIS — R739 Hyperglycemia, unspecified: Secondary | ICD-10-CM | POA: Diagnosis not present

## 2022-08-16 DIAGNOSIS — Z1231 Encounter for screening mammogram for malignant neoplasm of breast: Secondary | ICD-10-CM | POA: Diagnosis not present

## 2022-08-16 DIAGNOSIS — M542 Cervicalgia: Secondary | ICD-10-CM | POA: Diagnosis not present

## 2022-08-17 ENCOUNTER — Other Ambulatory Visit: Payer: Self-pay | Admitting: Internal Medicine

## 2022-08-17 DIAGNOSIS — Z1231 Encounter for screening mammogram for malignant neoplasm of breast: Secondary | ICD-10-CM

## 2022-08-29 DIAGNOSIS — M542 Cervicalgia: Secondary | ICD-10-CM | POA: Diagnosis not present

## 2022-08-29 DIAGNOSIS — I10 Essential (primary) hypertension: Secondary | ICD-10-CM | POA: Diagnosis not present

## 2022-11-06 DIAGNOSIS — Z0000 Encounter for general adult medical examination without abnormal findings: Secondary | ICD-10-CM | POA: Diagnosis not present

## 2022-11-06 DIAGNOSIS — R739 Hyperglycemia, unspecified: Secondary | ICD-10-CM | POA: Diagnosis not present

## 2022-11-06 DIAGNOSIS — Z1231 Encounter for screening mammogram for malignant neoplasm of breast: Secondary | ICD-10-CM | POA: Diagnosis not present

## 2022-11-06 DIAGNOSIS — E079 Disorder of thyroid, unspecified: Secondary | ICD-10-CM | POA: Diagnosis not present

## 2022-11-06 DIAGNOSIS — I10 Essential (primary) hypertension: Secondary | ICD-10-CM | POA: Diagnosis not present

## 2022-11-06 DIAGNOSIS — Z79899 Other long term (current) drug therapy: Secondary | ICD-10-CM | POA: Diagnosis not present

## 2022-11-06 DIAGNOSIS — Z1211 Encounter for screening for malignant neoplasm of colon: Secondary | ICD-10-CM | POA: Diagnosis not present

## 2022-11-06 DIAGNOSIS — E785 Hyperlipidemia, unspecified: Secondary | ICD-10-CM | POA: Diagnosis not present

## 2022-11-08 ENCOUNTER — Emergency Department: Payer: Medicare HMO

## 2022-11-08 ENCOUNTER — Emergency Department
Admission: EM | Admit: 2022-11-08 | Discharge: 2022-11-08 | Disposition: A | Discharge status: Home / Self Care | Payer: Medicare HMO | Attending: Emergency Medicine | Admitting: Emergency Medicine

## 2022-11-08 DIAGNOSIS — R519 Headache, unspecified: Secondary | ICD-10-CM | POA: Insufficient documentation

## 2022-11-08 DIAGNOSIS — R509 Fever, unspecified: Secondary | ICD-10-CM | POA: Diagnosis not present

## 2022-11-08 DIAGNOSIS — E039 Hypothyroidism, unspecified: Secondary | ICD-10-CM | POA: Diagnosis not present

## 2022-11-08 DIAGNOSIS — R4182 Altered mental status, unspecified: Secondary | ICD-10-CM | POA: Diagnosis not present

## 2022-11-08 DIAGNOSIS — R112 Nausea with vomiting, unspecified: Secondary | ICD-10-CM | POA: Insufficient documentation

## 2022-11-08 DIAGNOSIS — I10 Essential (primary) hypertension: Secondary | ICD-10-CM | POA: Diagnosis not present

## 2022-11-08 DIAGNOSIS — R197 Diarrhea, unspecified: Secondary | ICD-10-CM | POA: Diagnosis not present

## 2022-11-08 LAB — CBC
HCT: 39.4 % (ref 36.0–46.0)
Hemoglobin: 12.2 g/dL (ref 12.0–15.0)
MCH: 24.4 pg — ABNORMAL LOW (ref 26.0–34.0)
MCHC: 31 g/dL (ref 30.0–36.0)
MCV: 78.6 fL — ABNORMAL LOW (ref 80.0–100.0)
Platelets: 248 10*3/uL (ref 150–400)
RBC: 5.01 MIL/uL (ref 3.87–5.11)
RDW: 14.8 % (ref 11.5–15.5)
WBC: 4.4 10*3/uL (ref 4.0–10.5)
nRBC: 0 % (ref 0.0–0.2)

## 2022-11-08 LAB — URINALYSIS, ROUTINE W REFLEX MICROSCOPIC
Bilirubin Urine: NEGATIVE
Glucose, UA: NEGATIVE mg/dL
Hgb urine dipstick: NEGATIVE
Ketones, ur: NEGATIVE mg/dL
Leukocytes,Ua: NEGATIVE
Nitrite: NEGATIVE
Protein, ur: NEGATIVE mg/dL
Specific Gravity, Urine: 1.01 (ref 1.005–1.030)
pH: 6 (ref 5.0–8.0)

## 2022-11-08 LAB — BASIC METABOLIC PANEL
Anion gap: 9 (ref 5–15)
BUN: 9 mg/dL (ref 8–23)
CO2: 23 mmol/L (ref 22–32)
Calcium: 8.8 mg/dL — ABNORMAL LOW (ref 8.9–10.3)
Chloride: 103 mmol/L (ref 98–111)
Creatinine, Ser: 0.87 mg/dL (ref 0.44–1.00)
GFR, Estimated: >60 mL/min (ref >60)
Glucose, Bld: 98 mg/dL (ref 70–99)
Potassium: 3 mmol/L — ABNORMAL LOW (ref 3.5–5.1)
Sodium: 135 mmol/L (ref 135–145)

## 2022-11-08 MED ORDER — SODIUM CHLORIDE 0.9 % IV BOLUS
1000.0000 mL | Freq: Once | INTRAVENOUS | Status: AC
  Administered 2022-11-08: 1000 mL via INTRAVENOUS

## 2022-11-08 MED ORDER — ONDANSETRON HCL 4 MG/2ML IJ SOLN
4.0000 mg | Freq: Once | INTRAMUSCULAR | Status: AC
  Administered 2022-11-08: 4 mg via INTRAVENOUS
  Filled 2022-11-08: qty 2

## 2022-11-08 MED ORDER — POTASSIUM CHLORIDE 20 MEQ PO PACK
40.0000 meq | PACK | Freq: Once | ORAL | Status: AC
  Administered 2022-11-08: 40 meq via ORAL
  Filled 2022-11-08: qty 2

## 2022-11-08 NOTE — ED Notes (Signed)
Pt son at bedside.

## 2022-11-08 NOTE — Discharge Instructions (Addendum)
Your CT scan of the head and your test were both okay today.  Please follow-up with your doctor for further evaluation of your symptoms.

## 2022-11-08 NOTE — ED Triage Notes (Signed)
First RN note-  Pt BIB [facility] for AMS. Last known well was 6pm last night. Pt has history of UTI. A&O x 3, pt did not know today was her birthday.   CBG with EMS- 99

## 2022-11-08 NOTE — ED Provider Notes (Signed)
[facility] Provider Note   Event Date/Time   First MD Initiated Contact with Patient 11/08/22 1311     (approximate) History  Headache  HPI Charlene Thomas is a 77 y.o. female with a past medical history of hypertension, hypothyroidism, GERD, cholecystectomy who presents for altered mental status.  Son notes that patient had been in bed with nausea/vomiting/diarrhea as well as fever over this last week until the last 3 days when patient's symptoms had significantly improved and she had been able to get out of bed and go see the physician at the beginning of the week.  Son at bedside states that patient's last known well time was at 6 PM last night and that when he talked her this morning she was very confused, not grasping the right time, and did not know that today was her birthday.  Son states the patient has had similar symptoms in the past when she has a urinary tract infection.  ROS: Unable to assess Patient currently denies any vision changes, tinnitus, difficulty speaking, facial droop, sore throat, chest pain, shortness of breath, abdominal pain, nausea/vomiting/diarrhea, dysuria, or weakness/numbness/paresthesias in any extremity   Physical Exam  Triage Vital Signs: ED Triage Vitals  Enc Vitals Group     BP 11/08/22 1123 (!) 132/94     Pulse Rate 11/08/22 1123 88     Resp 11/08/22 1123 17     Temp 11/08/22 1123 98.1 F (36.7 C)     Temp Source 11/08/22 1123 Oral     SpO2 11/08/22 1123 95 %     Weight 11/08/22 1124 152 lb (68.9 kg)     Height --      Head Circumference --      Peak Flow --      Pain Score 11/08/22 1124 0     Pain Loc --      Pain Edu? --      Excl. in [place]? --    Most recent vital signs: Vitals:   11/08/22 1402 11/08/22 1500  BP: (!) 143/86 (!) 139/95  Pulse: 73 69  Resp: (!) 21 (!) 21  Temp:    SpO2: 93% 95%   General: Awake, oriented x2. CV:  Good peripheral perfusion.  Resp:  Normal effort.  Abd:  No distention.   Other:  Elderly overweight Caucasian female laying in bed in no acute distress. ED Results / Procedures / Treatments  Labs (all labs ordered are listed, but only abnormal results are displayed) Labs Reviewed  BASIC METABOLIC PANEL - Abnormal; Notable for the following components:      Result Value   Potassium 3.0 (*)    Calcium 8.8 (*)    All other components within normal limits  CBC - Abnormal; Notable for the following components:   MCV 78.6 (*)    MCH 24.4 (*)    All other components within normal limits  URINALYSIS, ROUTINE W REFLEX MICROSCOPIC   RADIOLOGY ED MD interpretation: CT of the head without contrast interpreted by me shows no evidence of acute abnormalities including no intracerebral hemorrhage, obvious masses, or significant edema -Agree with radiology assessment Official radiology report(s): CT Head Wo Contrast  Result Date: 11/08/2022 CLINICAL DATA:  Mental status change, unknown cause. EXAM: CT HEAD WITHOUT CONTRAST TECHNIQUE: Contiguous axial images were obtained from the base of the skull through the vertex without intravenous contrast. RADIATION DOSE REDUCTION: This exam was performed according to the departmental dose-optimization program which includes automated exposure control, adjustment  of the mA and/or kV according to patient size and/or use of iterative reconstruction technique. COMPARISON:  Brain MRI 01/18/2014. FINDINGS: Brain: No acute intracranial hemorrhage. Confluent hypoattenuation of the cerebral white matter, most consistent with chronic small-vessel disease, progressed from 2015. Gray-white differentiation is otherwise preserved. Age-appropriate volume loss. No extra-axial collection. Basilar cisterns are patent. Vascular: No hyperdense vessel or unexpected calcification. Skull: No calvarial fracture or suspicious bone lesion. Skull base is unremarkable. Sinuses/Orbits: Partial opacification of the sphenoid sinuses and ethmoid air cells. Trace  opacification of the mastoid air cells. Orbits are unremarkable. Other: None. IMPRESSION: 1. No acute intracranial abnormality. 2. Moderate chronic small-vessel disease, progressed from 2015. Electronically Signed   By: Emmit Alexanders M.D.   On: 11/08/2022 13:58   PROCEDURES: Critical Care performed: No Procedures MEDICATIONS ORDERED IN ED: Medications  potassium chloride (KLOR-CON) packet 40 mEq (40 mEq Oral Given 11/08/22 1515)  sodium chloride 0.9 % bolus 1,000 mL (1,000 mLs Intravenous New Bag/Given 11/08/22 1510)  ondansetron (ZOFRAN) injection 4 mg (4 mg Intravenous Given 11/08/22 1602)   IMPRESSION / MDM / ASSESSMENT AND PLAN / ED COURSE  I reviewed the triage vital signs and the nursing notes.                             The patient is on the cardiac monitor to evaluate for evidence of arrhythmia and/or significant heart rate changes. Patient's presentation is most consistent with acute presentation with potential threat to life or bodily function. The patient suffered an episode of altered mental status, but there is no overt concern for a dangerous emergent cause such as, but not limited to, CNS infection, severe Toxidrome, severe metabolic derangement, or stroke. Workup pending  Disposition: Care of this patient will be signed out to the oncoming physician at the end of my shift.  All pertinent patient information conveyed and all questions answered.  All further care and disposition decisions will be made by the oncoming physician.   FINAL CLINICAL IMPRESSION(S) / ED DIAGNOSES   Final diagnoses:  Altered mental status, unspecified altered mental status type   Rx / DC Orders   ED Discharge Orders     None      Note:  This document was prepared using Dragon voice recognition software and may include unintentional dictation errors.   Naaman Plummer, MD 11/08/22 [phone]

## 2022-11-08 NOTE — ED Notes (Signed)
Pt up to the restroom to provide urine sample. Sample contaminated with stool. Provider notified.

## 2022-11-08 NOTE — ED Triage Notes (Signed)
PT son sts that pt was in the bed last week and went to the dr at the beginning of this week. Per son pt has been confused a little and not really grasping time. Pt is A/Ox2 at this time.

## 2022-11-08 NOTE — ED Notes (Signed)
Pt up to the restroom to provide urine sample. Pt unable to urinate.

## 2022-11-19 IMAGING — CR DG CHEST 2V
2 series · 2 of 2 positions shown · non-contrast
Comparison: Chest CT 04/20/2015, no interval chest imaging.

CLINICAL DATA: Wheezing.  Chills and emesis.

EXAM:
CHEST - 2 VIEW

[chest lat]
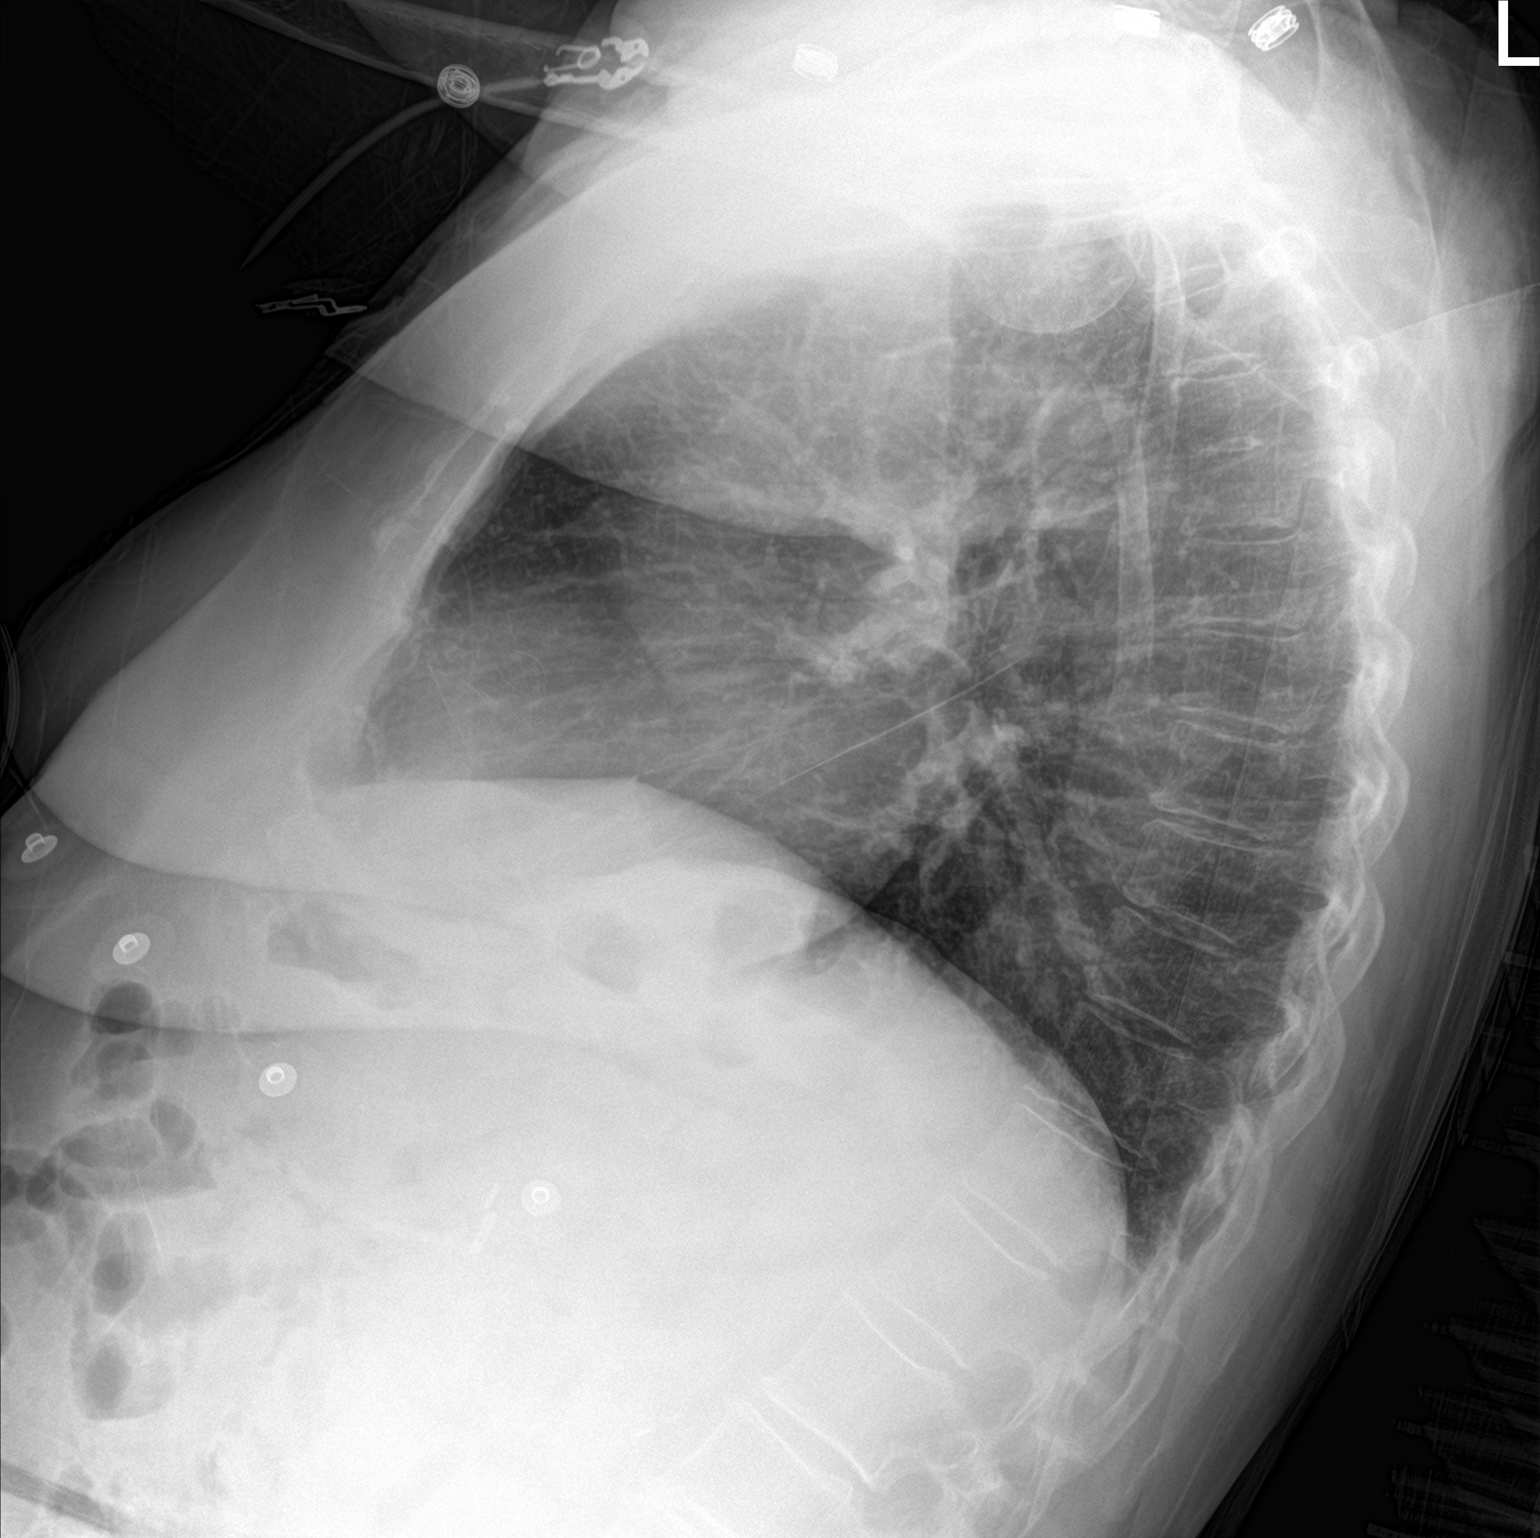

[chest ap]
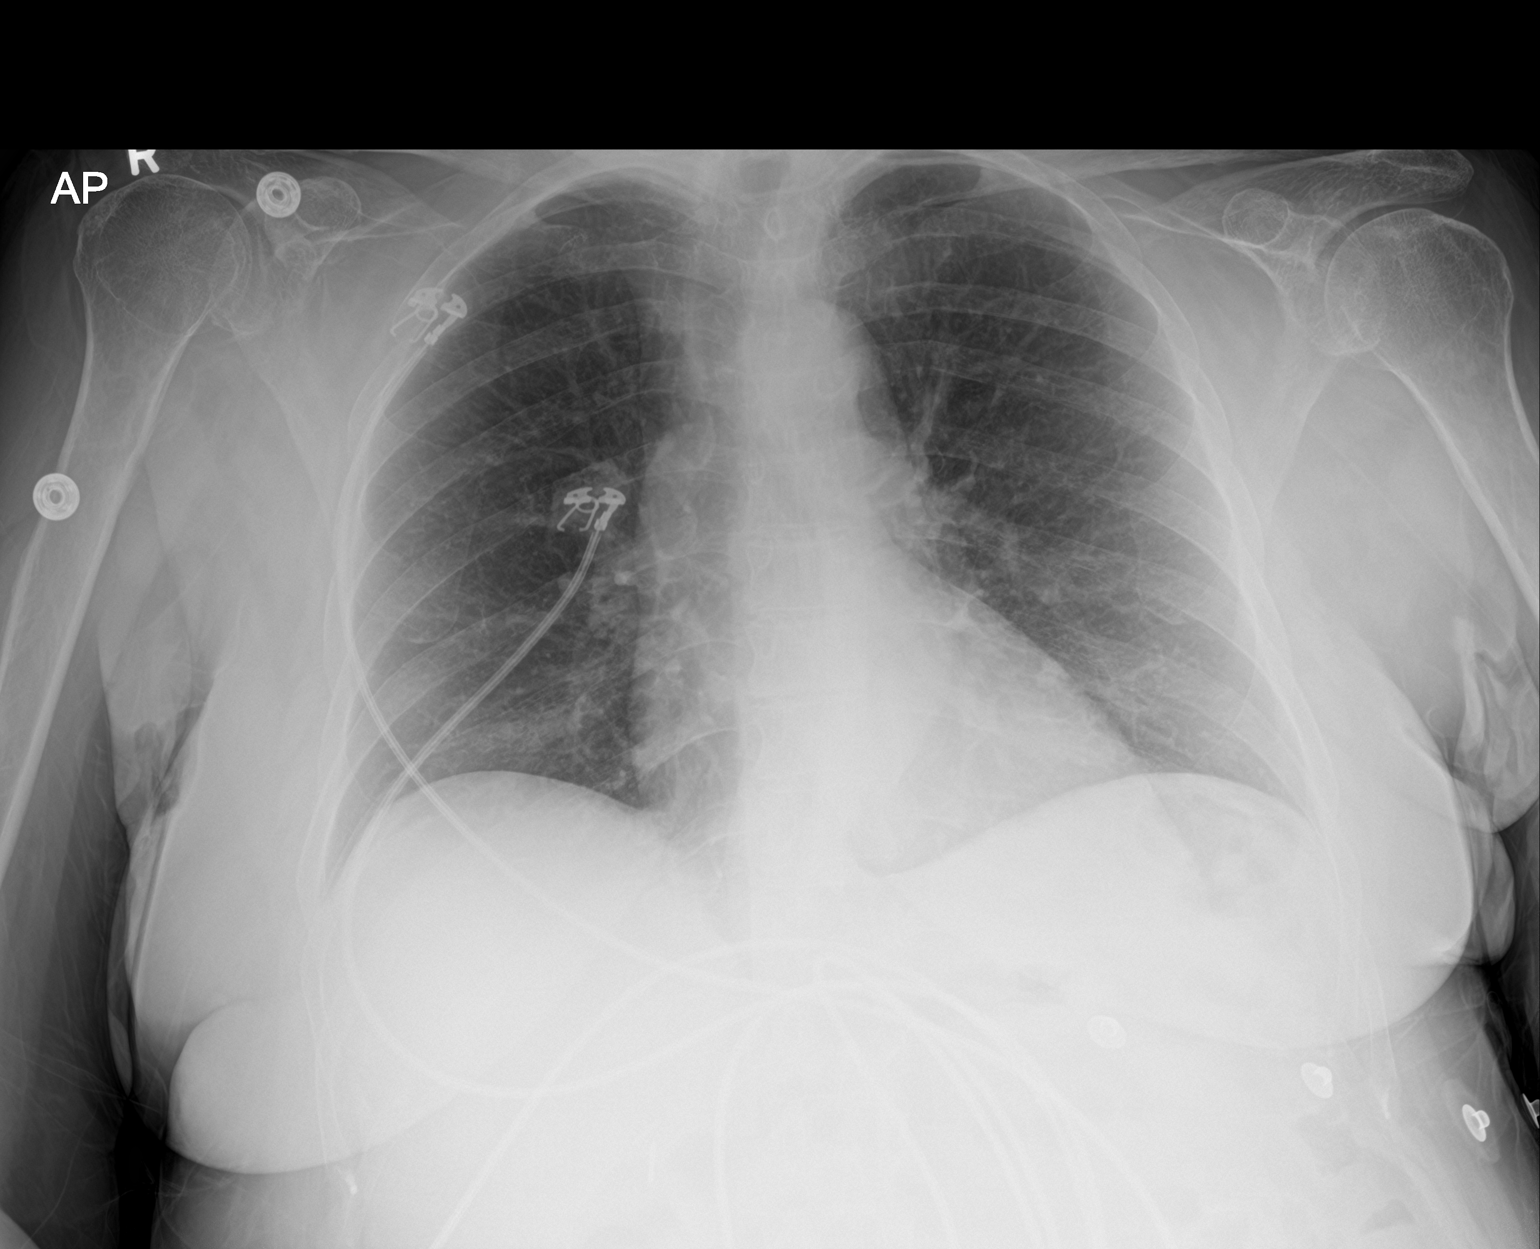

[2 of 2 positions shown; findings below may reference images not displayed]

FINDINGS: The cardiomediastinal contours are normal. Aortic tortuosity is seen
on prior chest CT. Minimal bronchial thickening. Pulmonary
vasculature is normal. No consolidation, pleural effusion, or
pneumothorax. No acute osseous abnormalities are seen.
IMPRESSION: Minimal bronchial thickening without pneumonia.

## 2023-01-26 ENCOUNTER — Emergency Department: Payer: Medicare HMO

## 2023-01-26 ENCOUNTER — Other Ambulatory Visit: Payer: Self-pay

## 2023-01-26 ENCOUNTER — Inpatient Hospital Stay
Admission: EM | Admit: 2023-01-26 | Discharge: 2023-01-28 | DRG: 563 | Disposition: A | Discharge status: Home Health | Payer: Medicare HMO | Attending: Internal Medicine | Admitting: Internal Medicine

## 2023-01-26 DIAGNOSIS — S42202A Unspecified fracture of upper end of left humerus, initial encounter for closed fracture: Secondary | ICD-10-CM | POA: Diagnosis not present

## 2023-01-26 DIAGNOSIS — R0902 Hypoxemia: Secondary | ICD-10-CM | POA: Diagnosis present

## 2023-01-26 DIAGNOSIS — S42295A Other nondisplaced fracture of upper end of left humerus, initial encounter for closed fracture: Secondary | ICD-10-CM | POA: Diagnosis not present

## 2023-01-26 DIAGNOSIS — S42212A Unspecified displaced fracture of surgical neck of left humerus, initial encounter for closed fracture: Secondary | ICD-10-CM

## 2023-01-26 DIAGNOSIS — K219 Gastro-esophageal reflux disease without esophagitis: Secondary | ICD-10-CM | POA: Diagnosis present

## 2023-01-26 DIAGNOSIS — W19XXXA Unspecified fall, initial encounter: Secondary | ICD-10-CM | POA: Diagnosis not present

## 2023-01-26 DIAGNOSIS — R109 Unspecified abdominal pain: Secondary | ICD-10-CM | POA: Diagnosis not present

## 2023-01-26 DIAGNOSIS — Z886 Allergy status to analgesic agent status: Secondary | ICD-10-CM | POA: Diagnosis not present

## 2023-01-26 DIAGNOSIS — Z87442 Personal history of urinary calculi: Secondary | ICD-10-CM | POA: Diagnosis not present

## 2023-01-26 DIAGNOSIS — R110 Nausea: Secondary | ICD-10-CM | POA: Diagnosis not present

## 2023-01-26 DIAGNOSIS — R9082 White matter disease, unspecified: Secondary | ICD-10-CM | POA: Diagnosis not present

## 2023-01-26 DIAGNOSIS — S42292A Other displaced fracture of upper end of left humerus, initial encounter for closed fracture: Principal | ICD-10-CM | POA: Diagnosis present

## 2023-01-26 DIAGNOSIS — M1990 Unspecified osteoarthritis, unspecified site: Secondary | ICD-10-CM | POA: Diagnosis present

## 2023-01-26 DIAGNOSIS — A419 Sepsis, unspecified organism: Secondary | ICD-10-CM

## 2023-01-26 DIAGNOSIS — Z888 Allergy status to other drugs, medicaments and biological substances status: Secondary | ICD-10-CM | POA: Diagnosis not present

## 2023-01-26 DIAGNOSIS — Z043 Encounter for examination and observation following other accident: Secondary | ICD-10-CM | POA: Diagnosis not present

## 2023-01-26 DIAGNOSIS — Y92009 Unspecified place in unspecified non-institutional (private) residence as the place of occurrence of the external cause: Secondary | ICD-10-CM | POA: Diagnosis not present

## 2023-01-26 DIAGNOSIS — G2581 Restless legs syndrome: Secondary | ICD-10-CM | POA: Diagnosis not present

## 2023-01-26 DIAGNOSIS — Z79899 Other long term (current) drug therapy: Secondary | ICD-10-CM | POA: Diagnosis not present

## 2023-01-26 DIAGNOSIS — I10 Essential (primary) hypertension: Secondary | ICD-10-CM | POA: Diagnosis present

## 2023-01-26 DIAGNOSIS — W010XXA Fall on same level from slipping, tripping and stumbling without subsequent striking against object, initial encounter: Secondary | ICD-10-CM | POA: Diagnosis present

## 2023-01-26 DIAGNOSIS — J209 Acute bronchitis, unspecified: Secondary | ICD-10-CM | POA: Diagnosis present

## 2023-01-26 DIAGNOSIS — E039 Hypothyroidism, unspecified: Secondary | ICD-10-CM | POA: Diagnosis present

## 2023-01-26 DIAGNOSIS — J40 Bronchitis, not specified as acute or chronic: Secondary | ICD-10-CM

## 2023-01-26 DIAGNOSIS — R9431 Abnormal electrocardiogram [ECG] [EKG]: Secondary | ICD-10-CM | POA: Diagnosis not present

## 2023-01-26 DIAGNOSIS — R6510 Systemic inflammatory response syndrome (SIRS) of non-infectious origin without acute organ dysfunction: Secondary | ICD-10-CM | POA: Diagnosis present

## 2023-01-26 DIAGNOSIS — Z1152 Encounter for screening for COVID-19: Secondary | ICD-10-CM

## 2023-01-26 DIAGNOSIS — E872 Acidosis, unspecified: Secondary | ICD-10-CM | POA: Diagnosis present

## 2023-01-26 DIAGNOSIS — Z79890 Hormone replacement therapy (postmenopausal): Secondary | ICD-10-CM | POA: Diagnosis not present

## 2023-01-26 DIAGNOSIS — K529 Noninfective gastroenteritis and colitis, unspecified: Secondary | ICD-10-CM | POA: Diagnosis present

## 2023-01-26 DIAGNOSIS — M79603 Pain in arm, unspecified: Secondary | ICD-10-CM | POA: Diagnosis not present

## 2023-01-26 DIAGNOSIS — S42232A 3-part fracture of surgical neck of left humerus, initial encounter for closed fracture: Secondary | ICD-10-CM | POA: Diagnosis not present

## 2023-01-26 DIAGNOSIS — Z87891 Personal history of nicotine dependence: Secondary | ICD-10-CM

## 2023-01-26 DIAGNOSIS — R61 Generalized hyperhidrosis: Secondary | ICD-10-CM | POA: Diagnosis not present

## 2023-01-26 LAB — COMPREHENSIVE METABOLIC PANEL
ALT: 11 U/L (ref 0–44)
AST: 27 U/L (ref 15–41)
Albumin: 4.3 g/dL (ref 3.5–5.0)
Alkaline Phosphatase: 98 U/L (ref 38–126)
Anion gap: 13 (ref 5–15)
BUN: 14 mg/dL (ref 8–23)
CO2: 20 mmol/L — ABNORMAL LOW (ref 22–32)
Calcium: 9.4 mg/dL (ref 8.9–10.3)
Chloride: 99 mmol/L (ref 98–111)
Creatinine, Ser: 0.84 mg/dL (ref 0.44–1.00)
GFR, Estimated: >60 mL/min (ref >60)
Glucose, Bld: 136 mg/dL — ABNORMAL HIGH (ref 70–99)
Potassium: 3.9 mmol/L (ref 3.5–5.1)
Sodium: 132 mmol/L — ABNORMAL LOW (ref 135–145)
Total Bilirubin: 0.6 mg/dL (ref 0.3–1.2)
Total Protein: 8.4 g/dL — ABNORMAL HIGH (ref 6.5–8.1)

## 2023-01-26 LAB — CBC
HCT: 39.4 % (ref 36.0–46.0)
Hemoglobin: 12.3 g/dL (ref 12.0–15.0)
MCH: 24.7 pg — ABNORMAL LOW (ref 26.0–34.0)
MCHC: 31.2 g/dL (ref 30.0–36.0)
MCV: 79.1 fL — ABNORMAL LOW (ref 80.0–100.0)
Platelets: 328 10*3/uL (ref 150–400)
RBC: 4.98 MIL/uL (ref 3.87–5.11)
RDW: 14.7 % (ref 11.5–15.5)
WBC: 12.4 10*3/uL — ABNORMAL HIGH (ref 4.0–10.5)
nRBC: 0 % (ref 0.0–0.2)

## 2023-01-26 LAB — TROPONIN I (HIGH SENSITIVITY): Troponin I (High Sensitivity): 5 ng/L (ref <18)

## 2023-01-26 LAB — LIPASE, BLOOD: Lipase: 32 U/L (ref 11–51)

## 2023-01-26 MED ORDER — IOHEXOL 300 MG/ML  SOLN
100.0000 mL | Freq: Once | INTRAMUSCULAR | Status: AC | PRN
  Administered 2023-01-26: 100 mL via INTRAVENOUS

## 2023-01-26 NOTE — ED Notes (Signed)
Pt taken to scans  

## 2023-01-26 NOTE — ED Notes (Addendum)
Pt family again up to desk asking when pt will be seen. This RN explained that she will be called when a room becomes available. Pt reporting that pain in abdomen is getting worse. Quale MD messaged at this time

## 2023-01-26 NOTE — ED Notes (Addendum)
Pt had EKG done posterior due the fractures in right arm. MD Quale notified

## 2023-01-26 NOTE — ED Triage Notes (Signed)
Pt comes from home via EMS after fall outside after tripping over own feet. Pt landed on left shoulder. Denies LOC.

## 2023-01-26 NOTE — ED Notes (Addendum)
Pt family to nurses desk reporting that pt is now complaining of upper abd pain at this time. Pt taking to triage room to obtain bloodwork.

## 2023-01-27 ENCOUNTER — Other Ambulatory Visit: Payer: Self-pay

## 2023-01-27 ENCOUNTER — Emergency Department: Payer: Medicare HMO

## 2023-01-27 DIAGNOSIS — K529 Noninfective gastroenteritis and colitis, unspecified: Secondary | ICD-10-CM | POA: Insufficient documentation

## 2023-01-27 DIAGNOSIS — Z1152 Encounter for screening for COVID-19: Secondary | ICD-10-CM | POA: Diagnosis not present

## 2023-01-27 DIAGNOSIS — S42292A Other displaced fracture of upper end of left humerus, initial encounter for closed fracture: Secondary | ICD-10-CM | POA: Diagnosis present

## 2023-01-27 DIAGNOSIS — Z79899 Other long term (current) drug therapy: Secondary | ICD-10-CM | POA: Diagnosis not present

## 2023-01-27 DIAGNOSIS — I10 Essential (primary) hypertension: Secondary | ICD-10-CM | POA: Diagnosis present

## 2023-01-27 DIAGNOSIS — G2581 Restless legs syndrome: Secondary | ICD-10-CM

## 2023-01-27 DIAGNOSIS — Z79890 Hormone replacement therapy (postmenopausal): Secondary | ICD-10-CM | POA: Diagnosis not present

## 2023-01-27 DIAGNOSIS — R6510 Systemic inflammatory response syndrome (SIRS) of non-infectious origin without acute organ dysfunction: Secondary | ICD-10-CM | POA: Diagnosis present

## 2023-01-27 DIAGNOSIS — R6520 Severe sepsis without septic shock: Secondary | ICD-10-CM

## 2023-01-27 DIAGNOSIS — Z886 Allergy status to analgesic agent status: Secondary | ICD-10-CM | POA: Diagnosis not present

## 2023-01-27 DIAGNOSIS — Y92009 Unspecified place in unspecified non-institutional (private) residence as the place of occurrence of the external cause: Secondary | ICD-10-CM | POA: Diagnosis not present

## 2023-01-27 DIAGNOSIS — E039 Hypothyroidism, unspecified: Secondary | ICD-10-CM | POA: Diagnosis present

## 2023-01-27 DIAGNOSIS — Z888 Allergy status to other drugs, medicaments and biological substances status: Secondary | ICD-10-CM | POA: Diagnosis not present

## 2023-01-27 DIAGNOSIS — S42202A Unspecified fracture of upper end of left humerus, initial encounter for closed fracture: Secondary | ICD-10-CM

## 2023-01-27 DIAGNOSIS — S42212A Unspecified displaced fracture of surgical neck of left humerus, initial encounter for closed fracture: Secondary | ICD-10-CM

## 2023-01-27 DIAGNOSIS — W010XXA Fall on same level from slipping, tripping and stumbling without subsequent striking against object, initial encounter: Secondary | ICD-10-CM | POA: Diagnosis present

## 2023-01-27 DIAGNOSIS — A419 Sepsis, unspecified organism: Secondary | ICD-10-CM | POA: Diagnosis present

## 2023-01-27 DIAGNOSIS — Z87442 Personal history of urinary calculi: Secondary | ICD-10-CM | POA: Diagnosis not present

## 2023-01-27 DIAGNOSIS — Z87891 Personal history of nicotine dependence: Secondary | ICD-10-CM | POA: Diagnosis not present

## 2023-01-27 DIAGNOSIS — J40 Bronchitis, not specified as acute or chronic: Secondary | ICD-10-CM

## 2023-01-27 DIAGNOSIS — E872 Acidosis, unspecified: Secondary | ICD-10-CM | POA: Diagnosis present

## 2023-01-27 DIAGNOSIS — J209 Acute bronchitis, unspecified: Secondary | ICD-10-CM | POA: Diagnosis present

## 2023-01-27 DIAGNOSIS — W19XXXA Unspecified fall, initial encounter: Secondary | ICD-10-CM

## 2023-01-27 DIAGNOSIS — R0902 Hypoxemia: Secondary | ICD-10-CM | POA: Diagnosis present

## 2023-01-27 DIAGNOSIS — K219 Gastro-esophageal reflux disease without esophagitis: Secondary | ICD-10-CM | POA: Diagnosis present

## 2023-01-27 DIAGNOSIS — M1990 Unspecified osteoarthritis, unspecified site: Secondary | ICD-10-CM | POA: Diagnosis present

## 2023-01-27 LAB — RESP PANEL BY RT-PCR (RSV, FLU A&B, COVID)  RVPGX2
Influenza A by PCR: NEGATIVE
Influenza B by PCR: NEGATIVE
Resp Syncytial Virus by PCR: NEGATIVE
SARS Coronavirus 2 by RT PCR: NEGATIVE

## 2023-01-27 LAB — URINALYSIS, W/ REFLEX TO CULTURE (INFECTION SUSPECTED)
Bacteria, UA: NONE SEEN
Bilirubin Urine: NEGATIVE
Glucose, UA: NEGATIVE mg/dL
Hgb urine dipstick: NEGATIVE
Ketones, ur: NEGATIVE mg/dL
Leukocytes,Ua: NEGATIVE
Nitrite: NEGATIVE
Protein, ur: NEGATIVE mg/dL
Specific Gravity, Urine: >1.046 — ABNORMAL HIGH (ref 1.005–1.030)
pH: 5 (ref 5.0–8.0)

## 2023-01-27 LAB — LACTIC ACID, PLASMA
Lactic Acid, Venous: 2.9 mmol/L (ref 0.5–1.9)
Lactic Acid, Venous: 4.7 mmol/L (ref 0.5–1.9)
Lactic Acid, Venous: 2.5 mmol/L (ref 0.5–1.9)

## 2023-01-27 LAB — BRAIN NATRIURETIC PEPTIDE: B Natriuretic Peptide: 40.4 pg/mL (ref 0.0–100.0)

## 2023-01-27 LAB — PROCALCITONIN: Procalcitonin: <0.1 ng/mL

## 2023-01-27 MED ORDER — LEVOTHYROXINE SODIUM 25 MCG PO TABS
125.0000 ug | ORAL_TABLET | Freq: Every day | ORAL | Status: DC
  Administered 2023-01-27 – 2023-01-28 (×2): 125 ug via ORAL
  Filled 2023-01-27: qty 3
  Filled 2023-01-27: qty 1

## 2023-01-27 MED ORDER — ENOXAPARIN SODIUM 300 MG/3ML IJ SOLN
0.5000 mg/kg | INTRAMUSCULAR | Status: DC
  Filled 2023-01-27: qty 0.4

## 2023-01-27 MED ORDER — LACTATED RINGERS IV BOLUS (SEPSIS)
1000.0000 mL | Freq: Once | INTRAVENOUS | Status: AC
  Administered 2023-01-27: 1000 mL via INTRAVENOUS

## 2023-01-27 MED ORDER — ONDANSETRON HCL 4 MG PO TABS: 4.0000 mg | ORAL_TABLET | Freq: Four times a day (QID) | ORAL | Status: DC | PRN

## 2023-01-27 MED ORDER — ONDANSETRON HCL 4 MG/2ML IJ SOLN
4.0000 mg | INTRAMUSCULAR | Status: AC
  Administered 2023-01-27: 4 mg via INTRAVENOUS
  Filled 2023-01-27: qty 2

## 2023-01-27 MED ORDER — METOPROLOL TARTRATE 25 MG PO TABS
25.0000 mg | ORAL_TABLET | Freq: Two times a day (BID) | ORAL | Status: DC
  Administered 2023-01-27 – 2023-01-28 (×2): 25 mg via ORAL
  Filled 2023-01-27 (×2): qty 1

## 2023-01-27 MED ORDER — PRAVASTATIN SODIUM 20 MG PO TABS
40.0000 mg | ORAL_TABLET | Freq: Every day | ORAL | Status: DC
  Administered 2023-01-27 – 2023-01-28 (×2): 40 mg via ORAL
  Filled 2023-01-27 (×2): qty 2

## 2023-01-27 MED ORDER — MORPHINE SULFATE (PF) 2 MG/ML IV SOLN
2.0000 mg | Freq: Once | INTRAVENOUS | Status: AC
  Administered 2023-01-27: 2 mg via INTRAVENOUS
  Filled 2023-01-27: qty 1

## 2023-01-27 MED ORDER — SODIUM CHLORIDE 0.9 % IV SOLN
2.0000 g | INTRAVENOUS | Status: DC
  Administered 2023-01-27 – 2023-01-28 (×2): 2 g via INTRAVENOUS
  Filled 2023-01-27 (×2): qty 20

## 2023-01-27 MED ORDER — DILTIAZEM HCL ER COATED BEADS 240 MG PO CP24
240.0000 mg | ORAL_CAPSULE | Freq: Every day | ORAL | Status: DC
  Administered 2023-01-27 – 2023-01-28 (×2): 240 mg via ORAL
  Filled 2023-01-27 (×2): qty 1

## 2023-01-27 MED ORDER — ONDANSETRON HCL 4 MG/2ML IJ SOLN: 4.0000 mg | Freq: Four times a day (QID) | INTRAMUSCULAR | Status: DC | PRN

## 2023-01-27 MED ORDER — PANTOPRAZOLE SODIUM 40 MG PO TBEC
40.0000 mg | DELAYED_RELEASE_TABLET | Freq: Every day | ORAL | Status: DC
  Administered 2023-01-27 – 2023-01-28 (×2): 40 mg via ORAL
  Filled 2023-01-27 (×2): qty 1

## 2023-01-27 MED ORDER — LOSARTAN POTASSIUM 50 MG PO TABS
100.0000 mg | ORAL_TABLET | Freq: Every day | ORAL | Status: DC
  Administered 2023-01-27 – 2023-01-28 (×2): 100 mg via ORAL
  Filled 2023-01-27 (×2): qty 2

## 2023-01-27 MED ORDER — SODIUM CHLORIDE 0.9 % IV SOLN: INTRAVENOUS | Status: AC

## 2023-01-27 MED ORDER — SODIUM CHLORIDE 0.9 % IV SOLN: INTRAVENOUS | Status: DC

## 2023-01-27 MED ORDER — SODIUM CHLORIDE 0.9 % IV SOLN
500.0000 mg | INTRAVENOUS | Status: DC
  Administered 2023-01-27: 500 mg via INTRAVENOUS
  Filled 2023-01-27 (×2): qty 5

## 2023-01-27 MED ORDER — MORPHINE SULFATE (PF) 2 MG/ML IV SOLN
2.0000 mg | INTRAVENOUS | Status: DC | PRN
  Administered 2023-01-27: 2 mg via INTRAVENOUS
  Filled 2023-01-27: qty 1

## 2023-01-27 MED ORDER — ACETAMINOPHEN 325 MG PO TABS
650.0000 mg | ORAL_TABLET | Freq: Four times a day (QID) | ORAL | Status: DC | PRN
  Administered 2023-01-27 – 2023-01-28 (×2): 650 mg via ORAL
  Filled 2023-01-27 (×2): qty 2

## 2023-01-27 MED ORDER — DULOXETINE HCL 30 MG PO CPEP
30.0000 mg | ORAL_CAPSULE | Freq: Every day | ORAL | Status: DC
  Administered 2023-01-27 – 2023-01-28 (×2): 30 mg via ORAL
  Filled 2023-01-27 (×2): qty 1

## 2023-01-27 MED ORDER — HYDROCODONE-ACETAMINOPHEN 5-325 MG PO TABS
1.0000 | ORAL_TABLET | ORAL | Status: DC | PRN
  Administered 2023-01-27: 2 via ORAL
  Filled 2023-01-27: qty 2

## 2023-01-27 MED ORDER — SODIUM CHLORIDE 0.9 % IV BOLUS (SEPSIS)
500.0000 mL | Freq: Once | INTRAVENOUS | Status: AC
  Administered 2023-01-27: 500 mL via INTRAVENOUS

## 2023-01-27 MED ORDER — ALBUTEROL SULFATE (2.5 MG/3ML) 0.083% IN NEBU: 2.5000 mg | INHALATION_SOLUTION | RESPIRATORY_TRACT | Status: DC | PRN

## 2023-01-27 MED ORDER — ACETAMINOPHEN 650 MG RE SUPP: 650.0000 mg | Freq: Four times a day (QID) | RECTAL | Status: DC | PRN

## 2023-01-27 MED ORDER — LACTATED RINGERS IV BOLUS (SEPSIS)
750.0000 mL | Freq: Once | INTRAVENOUS | Status: AC
  Administered 2023-01-27: 750 mL via INTRAVENOUS

## 2023-01-27 MED ORDER — ENOXAPARIN SODIUM 40 MG/0.4ML IJ SOSY
0.5000 mg/kg | PREFILLED_SYRINGE | INTRAMUSCULAR | Status: DC
  Administered 2023-01-27 – 2023-01-28 (×2): 40 mg via SUBCUTANEOUS
  Filled 2023-01-27 (×2): qty 0.4

## 2023-01-27 NOTE — Consult Note (Signed)
ORTHOPAEDIC CONSULTATION  REQUESTING PHYSICIAN: Delfino Lovett, MD  Chief Complaint: Left shoulder pain  HPI: Charlene Thomas is a 77 y.o. female who complains of left shoulder pain after a fall at home.  She was brought to the emergency room last night where exam and x-rays revealed a essentially nondisplaced fracture of the left humeral head and neck region.  Patient is being admitted for other problems and I been asked to consult on her.  She has been placed in a sling.  Pain is moderate.  Past Medical History:  Diagnosis Date   Anginal pain    Arthritis    Dysrhythmia    TACHYCARDIA   GERD (gastroesophageal reflux disease)    Heart palpitations    Hypertension    Hypothyroidism    Past Surgical History:  Procedure Laterality Date   ABDOMINAL HYSTERECTOMY     CATARACT EXTRACTION W/PHACO Left 05/15/2016   Procedure: CATARACT EXTRACTION PHACO AND INTRAOCULAR LENS PLACEMENT (IOC);  Surgeon: Galen Manila, MD;  Location: [facility] ORS;  Service: Ophthalmology;  Laterality: Left;  [place] 00:34 AP% 20.1 CDE 6.98 Fluid pack lot # [id number]   CATARACT EXTRACTION W/PHACO Right 06/12/2016   Procedure: CATARACT EXTRACTION PHACO AND INTRAOCULAR LENS PLACEMENT (IOC);  Surgeon: Galen Manila, MD;  Location: [facility] ORS;  Service: Ophthalmology;  Laterality: Right;  Lot# [id number] H [place]: 1:05.5 AP%:45.8 CDE:12.87   CESAREAN SECTION     X 3   CHOLECYSTECTOMY     GRAFTS     SKIN GRAFTS FOR BURNS   KIDNEY STONE SURGERY     THROAT SURGERY     Social History   Socioeconomic History   Marital status: Widowed    Spouse name: Not on file   Number of children: Not on file   Years of education: Not on file   Highest education level: Not on file  Occupational History   Not on file  Tobacco Use   Smoking status: Former   Smokeless tobacco: Never  Substance and Sexual Activity   Alcohol use: No   Drug use: No   Sexual activity: Not on file  Other Topics Concern   Not on file  Social History  Narrative   Not on file   Social Determinants of Health   Financial Resource Strain: Not on file  Food Insecurity: Not on file  Transportation Needs: Not on file  Physical Activity: Not on file  Stress: Not on file  Social Connections: Not on file   Family History  Problem Relation Age of Onset   Breast cancer Neg Hx    Allergies  Allergen Reactions   Aspirin    Celecoxib Other (See Comments)    Gi upset   Ezetimibe-Simvastatin     Other Reaction(s): Muscle Pain   Mobic [Meloxicam] Itching   Raloxifene     Other Reaction(s): Unknown  Upset gi   Topiramate Other (See Comments)    Causes severe confusion   Prior to Admission medications   Medication Sig Start Date End Date Taking? Authorizing Provider  cetirizine (ZYRTEC) 10 MG tablet Take 10 mg by mouth daily.   Yes [provider]  cyclobenzaprine (FLEXERIL) 10 MG tablet Take 10 mg by mouth daily as needed for muscle spasms. 08/16/22  Yes [provider]  diltiazem (CARDIZEM CD) 240 MG 24 hr capsule Take 240 mg by mouth daily. 01/02/23  Yes [provider]  DULoxetine (CYMBALTA) 30 MG capsule Take 30 mg by mouth daily.   Yes [provider]  levothyroxine (  SYNTHROID) 125 MCG tablet Take 125 mcg by mouth daily. 12/28/22  Yes [provider]  losartan (COZAAR) 100 MG tablet Take 100 mg by mouth daily. 12/28/22  Yes [provider]  metoprolol tartrate (LOPRESSOR) 25 MG tablet Take 25 mg by mouth 2 (two) times daily. 12/28/22  Yes [provider]  omeprazole (PRILOSEC) 20 MG capsule Take 20 mg by mouth 2 (two) times daily. 02/19/16  Yes [provider]  ondansetron (ZOFRAN ODT) 4 MG disintegrating tablet Take 1 tablet (4 mg total) by mouth every 8 (eight) hours as needed for nausea or vomiting. 04/18/21  Yes Maxwell Caul, PA-C   DG Chest Port 1 View  Result Date: 01/27/2023 CLINICAL DATA:  Questionable sepsis-evaluate for abnormality. Left fracture shoulder  EXAM: PORTABLE CHEST 1 VIEW COMPARISON:  Left shoulder radiographs 01/26/2023 and chest radiographs 04/18/2021 FINDINGS: Stable cardiomediastinal silhouette. Aortic atherosclerotic calcification. Low lung volumes accentuate pulmonary vascularity. Question mild perihilar and peribronchial thickening. No focal consolidation, pleural effusion, or pneumothorax. Mildly displaced fracture of the surgical neck of the left humerus. IMPRESSION: 1. Question mild bronchitis/reactive airways.  No focal pneumonia. 2. Mildly displaced fracture of the surgical neck of the left humerus. Electronically Signed   By: Minerva Fester M.D.   On: 01/27/2023 02:35   CT ABDOMEN PELVIS W CONTRAST  Result Date: 01/27/2023 CLINICAL DATA:  Abdominal pain, acute, nonlocalized.  Fall. EXAM: CT ABDOMEN AND PELVIS WITH CONTRAST TECHNIQUE: Multidetector CT imaging of the abdomen and pelvis was performed using the standard protocol following bolus administration of intravenous contrast. RADIATION DOSE REDUCTION: This exam was performed according to the departmental dose-optimization program which includes automated exposure control, adjustment of the mA and/or kV according to patient size and/or use of iterative reconstruction technique. CONTRAST:  OMNIPAQUE IOHEXOL 300 MG/ML  SOLN COMPARISON:  CT abdomen pelvis 04/18/2021 FINDINGS: Lower chest: No acute abnormality.  Coronary artery calcification. Hepatobiliary: Not enlarged. Couple subcentimeter hypodensities too small to characterize. No laceration or subcapsular hematoma. Status post cholecystectomy.  No biliary ductal dilatation. Pancreas: Normal pancreatic contour. No main pancreatic duct dilatation. Spleen: Not enlarged. No focal lesion. No laceration, subcapsular hematoma, or vascular injury. Adrenals/Urinary Tract: No nodularity bilaterally. Bilateral kidneys enhance symmetrically. No hydronephrosis. No contusion, laceration, or subcapsular hematoma. Subcentimeter hypodensities  too small to characterize-no further follow-up indicated. No injury to the vascular structures or collecting systems. No hydroureter. The urinary bladder is unremarkable. On delayed imaging, there is no urothelial wall thickening and there are no filling defects in the opacified portions of the bilateral collecting systems or ureters. Stomach/Bowel: No small or large bowel wall thickening or dilatation. Colonic diverticulosis. The appendix is not definitely identified with no inflammatory changes in the right lower quadrant to suggest acute appendicitis. Vasculature/Lymphatics: Moderate to severe atherosclerotic plaque. No abdominal aorta or iliac aneurysm. No active contrast extravasation or pseudoaneurysm. No abdominal, pelvic, inguinal lymphadenopathy. Reproductive: Normal. Other: No simple free fluid ascites. No pneumoperitoneum. No hemoperitoneum. No mesenteric hematoma identified. No organized fluid collection. Musculoskeletal: No significant soft tissue hematoma. No acute pelvic fracture. No spinal fracture. Stable grade 1 anterolisthesis of L5 on S1. Diffusely decreased bone density. Degenerative changes of the left carpal bones. Partially visualized in the left upper extremity. Ports and Devices: None. IMPRESSION: 1. No acute intra-abdominal or intrapelvic traumatic injury. 2. No acute fracture or traumatic malalignment of the lumbar spine. 3. Diffusely decreased bone density. Electronically Signed   By: Tish Frederickson M.D.   On: 01/27/2023 00:17   CT  HEAD WO CONTRAST ( )  Result Date: 01/27/2023 CLINICAL DATA:  Patient comes from home via EMS after fall outside after tripping over feet. Landed on left shoulder. EXAM: CT HEAD WITHOUT CONTRAST CT CERVICAL SPINE WITHOUT CONTRAST TECHNIQUE: Multidetector CT imaging of the head and cervical spine was performed following the standard protocol without intravenous contrast. Multiplanar CT image reconstructions of the cervical spine were also generated.  RADIATION DOSE REDUCTION: This exam was performed according to the departmental dose-optimization program which includes automated exposure control, adjustment of the mA and/or kV according to patient size and/or use of iterative reconstruction technique. COMPARISON:  CT head 11/08/2022 and report from cervical spine radiographs 08/16/2022 FINDINGS: CT HEAD FINDINGS Brain: No intracranial hemorrhage, mass effect, or evidence of acute infarct. No hydrocephalus. No extra-axial fluid collection. Generalized cerebral atrophy. Ill-defined hypoattenuation within the cerebral white matter is nonspecific but consistent with chronic small vessel ischemic disease. Right basal ganglia calcification. Vascular: No hyperdense vessel. Intracranial arterial calcification. Skull: No fracture or focal lesion. Sinuses/Orbits: No acute finding. Frothy secretions and air-fluid level in the left sphenoid sinus. Paranasal sinuses and mastoid air cells are otherwise well aerated. Other: None. CT CERVICAL SPINE FINDINGS Alignment: No evidence of traumatic malalignment. Skull base and vertebrae: No acute fracture. No primary bone lesion or focal pathologic process. Soft tissues and spinal canal: No prevertebral fluid or swelling. No visible canal hematoma. Disc levels: Multilevel spondylosis, disc space height loss, degenerative endplate change greatest at C6-C7 where it is advanced. Multilevel posterior disc osteophyte complexes cause mild effacement of the ventral thecal sac. Upper chest: Negative. Other: None. IMPRESSION: 1. No acute intracranial abnormality. Generalized atrophy and small vessel white matter disease. 2. No acute fracture in the cervical spine. Multilevel degenerative spondylosis. 3. Frothy secretions and air-fluid level in the left sphenoid sinus. Correlate for acute sinusitis. Electronically Signed   By: Minerva Fester M.D.   On: 01/27/2023 00:07   CT Cervical Spine Wo Contrast  Result Date: 01/27/2023 CLINICAL  DATA:  Patient comes from home via EMS after fall outside after tripping over feet. Landed on left shoulder. EXAM: CT HEAD WITHOUT CONTRAST CT CERVICAL SPINE WITHOUT CONTRAST TECHNIQUE: Multidetector CT imaging of the head and cervical spine was performed following the standard protocol without intravenous contrast. Multiplanar CT image reconstructions of the cervical spine were also generated. RADIATION DOSE REDUCTION: This exam was performed according to the departmental dose-optimization program which includes automated exposure control, adjustment of the mA and/or kV according to patient size and/or use of iterative reconstruction technique. COMPARISON:  CT head 11/08/2022 and report from cervical spine radiographs 08/16/2022 FINDINGS: CT HEAD FINDINGS Brain: No intracranial hemorrhage, mass effect, or evidence of acute infarct. No hydrocephalus. No extra-axial fluid collection. Generalized cerebral atrophy. Ill-defined hypoattenuation within the cerebral white matter is nonspecific but consistent with chronic small vessel ischemic disease. Right basal ganglia calcification. Vascular: No hyperdense vessel. Intracranial arterial calcification. Skull: No fracture or focal lesion. Sinuses/Orbits: No acute finding. Frothy secretions and air-fluid level in the left sphenoid sinus. Paranasal sinuses and mastoid air cells are otherwise well aerated. Other: None. CT CERVICAL SPINE FINDINGS Alignment: No evidence of traumatic malalignment. Skull base and vertebrae: No acute fracture. No primary bone lesion or focal pathologic process. Soft tissues and spinal canal: No prevertebral fluid or swelling. No visible canal hematoma. Disc levels: Multilevel spondylosis, disc space height loss, degenerative endplate change greatest at C6-C7 where it is advanced. Multilevel posterior disc osteophyte complexes cause mild effacement of the ventral thecal sac.  Upper chest: Negative. Other: None. IMPRESSION: 1. No acute intracranial  abnormality. Generalized atrophy and small vessel white matter disease. 2. No acute fracture in the cervical spine. Multilevel degenerative spondylosis. 3. Frothy secretions and air-fluid level in the left sphenoid sinus. Correlate for acute sinusitis. Electronically Signed   By: Minerva Fester M.D.   On: 01/27/2023 00:07   DG Shoulder Left  Result Date: 01/26/2023 CLINICAL DATA:  Fall on shoulder EXAM: LEFT SHOULDER - 2+ VIEW COMPARISON:  None Available. FINDINGS: Transverse humeral neck fracture, nondisplaced. Additional humeral head fracture is suspected, making this a 3 part fracture, although this is not well visualized. No evidence of dislocation. The visualized soft tissues are unremarkable. Visualized left lung is clear. IMPRESSION: Suspected three-part left proximal humerus fracture with dominant nondisplaced transverse humeral neck fracture. Electronically Signed   By: Charline Bills M.D.   On: 01/26/2023 20:28    Positive ROS: All other systems have been reviewed and were otherwise negative with the exception of those mentioned in the HPI and as above.  Physical Exam: General: Alert, no acute distress Cardiovascular: No pedal edema Respiratory: No cyanosis, no use of accessory musculature GI: No organomegaly, abdomen is soft and non-tender Skin: No lesions in the area of chief complaint Neurologic: Sensation intact distally Psychiatric: Patient is competent for consent with normal mood and affect Lymphatic: No axillary or cervical lymphadenopathy  MUSCULOSKELETAL: The patient is alert and awake and oriented.  She has pain with movement of the left shoulder.  Neurovascular status good distally.  There is no significant swelling or bruising yet.  No other orthopedic injuries are noted.  Assessment: Minimally displaced left proximal humerus fracture  Plan: Continue treatment with a sling, ice, and analgesics as needed. She should follow-up in my office in about 5 days. I advised  her to will take this about 6 weeks to heal fully and she will need therapy afterwards.    Valinda Hoar, MD [phone]   01/27/2023 1:19 PM

## 2023-01-27 NOTE — H&P (Signed)
History and Physical    Patient: Charlene Thomas UEA:[id number] DOB: Mar 05, 1946 DOA: 01/26/2023 DOS: the patient was seen and examined on 01/27/2023 PCP: Marguarite Arbour, MD  Patient coming from: Home  Chief Complaint:  Chief Complaint  Patient presents with   Fall    HPI: Charlene Thomas is a 77 y.o. female with medical history significant for HTN, hypothyroidism, chronic diarrhea following cholecystectomy several years ago who presents to the ED for evaluation of left arm pain following a fall at home.  Patient was previously in her usual state of health and states that felt like her legs were moving faster than her head making her fall.  She denies preceding headache, lightheadedness or visual disturbance, one-sided weakness numbness or tingling.  She denies preceding chest pain, palpitations or shortness of breath.  Apart from her chronic diarrhea which she has had for years and for which she takes loperamide, she denies otherwise feeling unwell.  She has no vomiting, abdominal pain, dysuria and does not have a cough or shortness of breath or chest pain, no fever or chills ED Course and data review: BP 154/102 with pulse 106 and otherwise normal vitals.  Patient was placed on O2 at 1 L per report of O2 sat in the low 90s.  Labs were notable for leukocytosis of 12,000 with lactic acid 2.9.  Troponin and BNP normal.  CBC and CMP otherwise unremarkable except for mild hyponatremia.  Urinalysis unremarkable, respiratory viral panel pending. EKG, personally viewed and interpreted showing NSR at 99 with nonspecific ST-T wave changes. Left shoulder x-ray showed a nondisplaced transverse humeral neck fracture Trauma imaging with CT head, C-spine and CT abdomen and pelvis with contrast showed no acute injury Chest x-ray showed "question mild bronchitis/reactive airways.  No focal pneumonia" Patient was treated with morphine for pain.  Due to concern for possible sepsis given elevated lactic acid,  patient started on sepsis fluids, Rocephin and azithromycin. The ED provider: Spoke with orthopedist, Dr. Hyacinth Meeker who advised that treatment is nonoperative and recommended an immobilizer sling. Hospitalist consulted for admission.   Review of Systems: As mentioned in the history of present illness. All other systems reviewed and are negative.  Past Medical History:  Diagnosis Date   Anginal pain    Arthritis    Dysrhythmia    TACHYCARDIA   GERD (gastroesophageal reflux disease)    Heart palpitations    Hypertension    Hypothyroidism    Past Surgical History:  Procedure Laterality Date   ABDOMINAL HYSTERECTOMY     CATARACT EXTRACTION W/PHACO Left 05/15/2016   Procedure: CATARACT EXTRACTION PHACO AND INTRAOCULAR LENS PLACEMENT (IOC);  Surgeon: Galen Manila, MD;  Location: [facility] ORS;  Service: Ophthalmology;  Laterality: Left;  [place] 00:34 AP% 20.1 CDE 6.98 Fluid pack lot # [id number]   CATARACT EXTRACTION W/PHACO Right 06/12/2016   Procedure: CATARACT EXTRACTION PHACO AND INTRAOCULAR LENS PLACEMENT (IOC);  Surgeon: Galen Manila, MD;  Location: [facility] ORS;  Service: Ophthalmology;  Laterality: Right;  Lot# [id number] H [place]: 1:05.5 AP%:45.8 CDE:12.87   CESAREAN SECTION     X 3   CHOLECYSTECTOMY     GRAFTS     SKIN GRAFTS FOR BURNS   KIDNEY STONE SURGERY     THROAT SURGERY     Social History:  reports that she has quit smoking. She has never used smokeless tobacco. She reports that she does not drink alcohol and does not use drugs.  Allergies  Allergen Reactions   Aspirin    Celecoxib  Other (See Comments)    Gi upset   Ezetimibe-Simvastatin     Other Reaction(s): Muscle Pain   Mobic [Meloxicam] Itching   Raloxifene     Other Reaction(s): Unknown  Upset gi    Family History  Problem Relation Age of Onset   Breast cancer Neg Hx     Prior to Admission medications   Medication Sig Start Date End Date Taking? Authorizing Provider  omeprazole (PRILOSEC) 20 MG capsule Take 20  mg by mouth 2 (two) times daily. 02/19/16  Yes [provider]  topiramate (TOPAMAX) 50 MG tablet Take 50 mg by mouth daily.   Yes [provider]  cyclobenzaprine (FLEXERIL) 10 MG tablet Take 10 mg by mouth at bedtime. 08/16/22   [provider]  diltiazem (CARDIZEM CD) 240 MG 24 hr capsule Take 240 mg by mouth daily. 01/02/23   [provider]  DULoxetine (CYMBALTA) 30 MG capsule Take 30 mg by mouth daily.    [provider]  levothyroxine (SYNTHROID) 125 MCG tablet Take 125 mcg by mouth daily. 12/28/22   [provider]  losartan (COZAAR) 100 MG tablet Take 100 mg by mouth daily. 12/28/22   [provider]  metoprolol tartrate (LOPRESSOR) 25 MG tablet Take 25 mg by mouth 2 (two) times daily. 12/28/22   [provider]  ondansetron (ZOFRAN ODT) 4 MG disintegrating tablet Take 1 tablet (4 mg total) by mouth every 8 (eight) hours as needed for nausea or vomiting. Patient not taking: Reported on 01/27/2023 04/18/21   Graciella Freer A, PA-C  pravastatin (PRAVACHOL) 40 MG tablet Take 40 mg by mouth daily. 04/18/16   [provider]  traMADol Janean Sark) 50 MG tablet Take by mouth. 08/29/22   [provider]    Physical Exam: Vitals:   01/27/23 0130 01/27/23 0200 01/27/23 0230 01/27/23 0300  BP:  (!) 159/109 (!) 137/106 (!) 155/106  Pulse: 99 (!) 105 (!) 116 100  Resp: 16 14 20 14   Temp:      TempSrc:      SpO2: 96% 95% 98% 97%   Physical Exam Vitals and nursing note reviewed.  Constitutional:      General: She is not in acute distress. HENT:     Head: Normocephalic and atraumatic.  Cardiovascular:     Rate and Rhythm: Regular rhythm. Tachycardia present.     Heart sounds: Normal heart sounds.  Pulmonary:     Effort: Pulmonary effort is normal.     Breath sounds: Normal breath sounds.  Abdominal:     Palpations: Abdomen is soft.     Tenderness: There is no abdominal tenderness.  Musculoskeletal:      Comments: Left arm in sling  Neurological:     Mental Status: Mental status is at baseline.     Labs on Admission: I have personally reviewed following labs and imaging studies  CBC: Recent Labs  Lab 01/26/23 2141  WBC 12.4*  HGB 12.3  HCT 39.4  MCV 79.1*  PLT 328   Basic Metabolic Panel: Recent Labs  Lab 01/26/23 2141  NA 132*  K 3.9  CL 99  CO2 20*  GLUCOSE 136*  BUN 14  CREATININE 0.84  CALCIUM 9.4   GFR: CrCl cannot be calculated (Unknown ideal weight.). Liver Function Tests: Recent Labs  Lab 01/26/23 2141  AST 27  ALT 11  ALKPHOS 98  BILITOT 0.6  PROT 8.4*  ALBUMIN 4.3   Recent Labs  Lab 01/26/23 2141  LIPASE 32  No results for input(s): "AMMONIA" in the last 168 hours. Coagulation Profile: No results for input(s): "INR", "PROTIME" in the last 168 hours. Cardiac Enzymes: No results for input(s): "CKTOTAL", "CKMB", "CKMBINDEX", "TROPONINI" in the last 168 hours. BNP (last 3 results) No results for input(s): "PROBNP" in the last 8760 hours. HbA1C: No results for input(s): "HGBA1C" in the last 72 hours. CBG: No results for input(s): "GLUCAP" in the last 168 hours. Lipid Profile: No results for input(s): "CHOL", "HDL", "LDLCALC", "TRIG", "CHOLHDL", "LDLDIRECT" in the last 72 hours. Thyroid Function Tests: No results for input(s): "TSH", "T4TOTAL", "FREET4", "T3FREE", "THYROIDAB" in the last 72 hours. Anemia Panel: No results for input(s): "VITAMINB12", "FOLATE", "FERRITIN", "TIBC", "IRON", "RETICCTPCT" in the last 72 hours. Urine analysis:    Component Value Date/Time   COLORURINE YELLOW (A) 01/27/2023 0234   APPEARANCEUR CLEAR (A) 01/27/2023 0234   LABSPEC >1.046 (H) 01/27/2023 0234   PHURINE 5.0 01/27/2023 0234   GLUCOSEU NEGATIVE 01/27/2023 0234   HGBUR NEGATIVE 01/27/2023 0234   BILIRUBINUR NEGATIVE 01/27/2023 0234   KETONESUR NEGATIVE 01/27/2023 0234   PROTEINUR NEGATIVE 01/27/2023 0234   NITRITE NEGATIVE 01/27/2023 0234    LEUKOCYTESUR NEGATIVE 01/27/2023 0234    Radiological Exams on Admission: DG Chest Port 1 View  Result Date: 01/27/2023 CLINICAL DATA:  Questionable sepsis-evaluate for abnormality. Left fracture shoulder EXAM: PORTABLE CHEST 1 VIEW COMPARISON:  Left shoulder radiographs 01/26/2023 and chest radiographs 04/18/2021 FINDINGS: Stable cardiomediastinal silhouette. Aortic atherosclerotic calcification. Low lung volumes accentuate pulmonary vascularity. Question mild perihilar and peribronchial thickening. No focal consolidation, pleural effusion, or pneumothorax. Mildly displaced fracture of the surgical neck of the left humerus. IMPRESSION: 1. Question mild bronchitis/reactive airways.  No focal pneumonia. 2. Mildly displaced fracture of the surgical neck of the left humerus. Electronically Signed   By: Minerva Fester M.D.   On: 01/27/2023 02:35   CT ABDOMEN PELVIS W CONTRAST  Result Date: 01/27/2023 CLINICAL DATA:  Abdominal pain, acute, nonlocalized.  Fall. EXAM: CT ABDOMEN AND PELVIS WITH CONTRAST TECHNIQUE: Multidetector CT imaging of the abdomen and pelvis was performed using the standard protocol following bolus administration of intravenous contrast. RADIATION DOSE REDUCTION: This exam was performed according to the departmental dose-optimization program which includes automated exposure control, adjustment of the mA and/or kV according to patient size and/or use of iterative reconstruction technique. CONTRAST:  OMNIPAQUE IOHEXOL 300 MG/ML  SOLN COMPARISON:  CT abdomen pelvis 04/18/2021 FINDINGS: Lower chest: No acute abnormality.  Coronary artery calcification. Hepatobiliary: Not enlarged. Couple subcentimeter hypodensities too small to characterize. No laceration or subcapsular hematoma. Status post cholecystectomy.  No biliary ductal dilatation. Pancreas: Normal pancreatic contour. No main pancreatic duct dilatation. Spleen: Not enlarged. No focal lesion. No laceration, subcapsular hematoma,  or vascular injury. Adrenals/Urinary Tract: No nodularity bilaterally. Bilateral kidneys enhance symmetrically. No hydronephrosis. No contusion, laceration, or subcapsular hematoma. Subcentimeter hypodensities too small to characterize-no further follow-up indicated. No injury to the vascular structures or collecting systems. No hydroureter. The urinary bladder is unremarkable. On delayed imaging, there is no urothelial wall thickening and there are no filling defects in the opacified portions of the bilateral collecting systems or ureters. Stomach/Bowel: No small or large bowel wall thickening or dilatation. Colonic diverticulosis. The appendix is not definitely identified with no inflammatory changes in the right lower quadrant to suggest acute appendicitis. Vasculature/Lymphatics: Moderate to severe atherosclerotic plaque. No abdominal aorta or iliac aneurysm. No active contrast extravasation or pseudoaneurysm. No abdominal, pelvic, inguinal lymphadenopathy. Reproductive: Normal. Other: No simple free fluid ascites.  No pneumoperitoneum. No hemoperitoneum. No mesenteric hematoma identified. No organized fluid collection. Musculoskeletal: No significant soft tissue hematoma. No acute pelvic fracture. No spinal fracture. Stable grade 1 anterolisthesis of L5 on S1. Diffusely decreased bone density. Degenerative changes of the left carpal bones. Partially visualized in the left upper extremity. Ports and Devices: None. IMPRESSION: 1. No acute intra-abdominal or intrapelvic traumatic injury. 2. No acute fracture or traumatic malalignment of the lumbar spine. 3. Diffusely decreased bone density. Electronically Signed   By: Tish Frederickson M.D.   On: 01/27/2023 00:17   CT HEAD WO CONTRAST ( )  Result Date: 01/27/2023 CLINICAL DATA:  Patient comes from home via EMS after fall outside after tripping over feet. Landed on left shoulder. EXAM: CT HEAD WITHOUT CONTRAST CT CERVICAL SPINE WITHOUT CONTRAST TECHNIQUE:  Multidetector CT imaging of the head and cervical spine was performed following the standard protocol without intravenous contrast. Multiplanar CT image reconstructions of the cervical spine were also generated. RADIATION DOSE REDUCTION: This exam was performed according to the departmental dose-optimization program which includes automated exposure control, adjustment of the mA and/or kV according to patient size and/or use of iterative reconstruction technique. COMPARISON:  CT head 11/08/2022 and report from cervical spine radiographs 08/16/2022 FINDINGS: CT HEAD FINDINGS Brain: No intracranial hemorrhage, mass effect, or evidence of acute infarct. No hydrocephalus. No extra-axial fluid collection. Generalized cerebral atrophy. Ill-defined hypoattenuation within the cerebral white matter is nonspecific but consistent with chronic small vessel ischemic disease. Right basal ganglia calcification. Vascular: No hyperdense vessel. Intracranial arterial calcification. Skull: No fracture or focal lesion. Sinuses/Orbits: No acute finding. Frothy secretions and air-fluid level in the left sphenoid sinus. Paranasal sinuses and mastoid air cells are otherwise well aerated. Other: None. CT CERVICAL SPINE FINDINGS Alignment: No evidence of traumatic malalignment. Skull base and vertebrae: No acute fracture. No primary bone lesion or focal pathologic process. Soft tissues and spinal canal: No prevertebral fluid or swelling. No visible canal hematoma. Disc levels: Multilevel spondylosis, disc space height loss, degenerative endplate change greatest at C6-C7 where it is advanced. Multilevel posterior disc osteophyte complexes cause mild effacement of the ventral thecal sac. Upper chest: Negative. Other: None. IMPRESSION: 1. No acute intracranial abnormality. Generalized atrophy and small vessel white matter disease. 2. No acute fracture in the cervical spine. Multilevel degenerative spondylosis. 3. Frothy secretions and air-fluid  level in the left sphenoid sinus. Correlate for acute sinusitis. Electronically Signed   By: Minerva Fester M.D.   On: 01/27/2023 00:07   CT Cervical Spine Wo Contrast  Result Date: 01/27/2023 CLINICAL DATA:  Patient comes from home via EMS after fall outside after tripping over feet. Landed on left shoulder. EXAM: CT HEAD WITHOUT CONTRAST CT CERVICAL SPINE WITHOUT CONTRAST TECHNIQUE: Multidetector CT imaging of the head and cervical spine was performed following the standard protocol without intravenous contrast. Multiplanar CT image reconstructions of the cervical spine were also generated. RADIATION DOSE REDUCTION: This exam was performed according to the departmental dose-optimization program which includes automated exposure control, adjustment of the mA and/or kV according to patient size and/or use of iterative reconstruction technique. COMPARISON:  CT head 11/08/2022 and report from cervical spine radiographs 08/16/2022 FINDINGS: CT HEAD FINDINGS Brain: No intracranial hemorrhage, mass effect, or evidence of acute infarct. No hydrocephalus. No extra-axial fluid collection. Generalized cerebral atrophy. Ill-defined hypoattenuation within the cerebral white matter is nonspecific but consistent with chronic small vessel ischemic disease. Right basal ganglia calcification. Vascular: No hyperdense vessel. Intracranial arterial calcification. Skull: No fracture or focal  lesion. Sinuses/Orbits: No acute finding. Frothy secretions and air-fluid level in the left sphenoid sinus. Paranasal sinuses and mastoid air cells are otherwise well aerated. Other: None. CT CERVICAL SPINE FINDINGS Alignment: No evidence of traumatic malalignment. Skull base and vertebrae: No acute fracture. No primary bone lesion or focal pathologic process. Soft tissues and spinal canal: No prevertebral fluid or swelling. No visible canal hematoma. Disc levels: Multilevel spondylosis, disc space height loss, degenerative endplate change  greatest at C6-C7 where it is advanced. Multilevel posterior disc osteophyte complexes cause mild effacement of the ventral thecal sac. Upper chest: Negative. Other: None. IMPRESSION: 1. No acute intracranial abnormality. Generalized atrophy and small vessel white matter disease. 2. No acute fracture in the cervical spine. Multilevel degenerative spondylosis. 3. Frothy secretions and air-fluid level in the left sphenoid sinus. Correlate for acute sinusitis. Electronically Signed   By: Minerva Fester M.D.   On: 01/27/2023 00:07   DG Shoulder Left  Result Date: 01/26/2023 CLINICAL DATA:  Fall on shoulder EXAM: LEFT SHOULDER - 2+ VIEW COMPARISON:  None Available. FINDINGS: Transverse humeral neck fracture, nondisplaced. Additional humeral head fracture is suspected, making this a 3 part fracture, although this is not well visualized. No evidence of dislocation. The visualized soft tissues are unremarkable. Visualized left lung is clear. IMPRESSION: Suspected three-part left proximal humerus fracture with dominant nondisplaced transverse humeral neck fracture. Electronically Signed   By: Charline Bills M.D.   On: 01/26/2023 20:28     Data Reviewed: Relevant notes from primary care and specialist visits, past discharge summaries as available in EHR, including Care Everywhere. Prior diagnostic testing as pertinent to current admission diagnoses Updated medications and problem lists for reconciliation ED course, including vitals, labs, imaging, treatment and response to treatment Triage notes, nursing and pharmacy notes and ED provider's notes Notable results as noted in HPI   Assessment and Plan: SIRS (systemic inflammatory response syndrome) Possible sepsis from possible respiratory tract infection/acute bronchitis Patient was tachycardic, mild hypoxemia of 90 with leukocytosis and lactic acidosis but no definite source of infection and patient denied acute complaints prior to her fall Treated  with sepsis fluids and antibiotics for possible respiratory tract infection, given mild bronchitis seen on chest x-ray - Follow-up respiratory viral panel - Holding off on antibiotics pending procalcitonin - Will allow for completion of sepsis fluids - Continue supplemental oxygen to keep sats over 94  Fx humeral neck, left, closed, initial encounter Accidental fall suspected Continue immobilizer sling Pain control PT eval Nonoperative per Dr. Hyacinth Meeker (consulted from the ED) Outpatient follow-up with Ortho  Chronic diarrhea Patient reports her diarrhea is at baseline Continue loperamide as needed  Hypothyroidism Continue levothyroxine  Hypertension Slightly elevated, likely secondary to pain Continue home metoprolol, losartan and diltiazem        DVT prophylaxis: Lovenox  Consults: none  Advance Care Planning: full code  Family Communication: none  Disposition Plan: Back to previous home environment  Severity of Illness: The appropriate patient status for this patient is INPATIENT. Inpatient status is judged to be reasonable and necessary in order to provide the required intensity of service to ensure the patient's safety. The patient's presenting symptoms, physical exam findings, and initial radiographic and laboratory data in the context of their chronic comorbidities is felt to place them at high risk for further clinical deterioration. Furthermore, it is not anticipated that the patient will be medically stable for discharge from the hospital within 2 midnights of admission.   * I certify that at the  point of admission it is my clinical judgment that the patient will require inpatient hospital care spanning beyond 2 midnights from the point of admission due to high intensity of service, high risk for further deterioration and high frequency of surveillance required.*  Author: Andris Baumann, MD 01/27/2023 4:57 AM  For on call review [email or web address].

## 2023-01-27 NOTE — ED Notes (Signed)
External cath placed on pt for easy urination.  Pt states she's unable to get up to ambulate with her shoulder hurting at this time

## 2023-01-27 NOTE — ED Notes (Signed)
Pt repositioned in bed for comfort, denies any needs at this time, call light is in reach.

## 2023-01-27 NOTE — Consult Note (Signed)
CODE SEPSIS - PHARMACY COMMUNICATION  **Broad Spectrum Antibiotics should be administered within 1 hour of Sepsis diagnosis**  Time Code Sepsis Called/Page Received: 0402  Antibiotics Ordered: Ceftriaxone, Azithromycin  Time of 1st antibiotic administration: 0426  Additional action taken by pharmacy: N/A  If necessary, Name of Provider/Nurse Contacted: N/A    Orson Aloe ,PharmD Clinical Pharmacist  01/27/2023  7:35 AM

## 2023-01-27 NOTE — Assessment & Plan Note (Signed)
Continue levothyroxine 

## 2023-01-27 NOTE — ED Provider Notes (Signed)
[facility] Provider Note    Event Date/Time   First MD Initiated Contact with Patient 01/26/23 2337     (approximate)   History   Fall   HPI Charlene Thomas is a 77 y.o. female who presents for evaluation after a fall at home.  She reports that she was ambulatory but felt like she started walking too fast outside and then fell on her left shoulder.  She did not lose consciousness and does not think that she struck her head but cannot be certain.  No neck pain, no numbness or weakness in her extremities but she has severe pain in her left shoulder.  Moving it makes it worse.  She has no nausea or vomiting.  She has been a little bit short of breath and her oxygen saturation has been low since coming to the emergency department.  No recent fever.  Of note, she also has intermittent episodes of abdominal pain which occurred while she was awaiting evaluation.  She said that this happens sometimes and happened while she was waiting for room but then completely went away and now she has no pain.     Physical Exam   Triage Vital Signs: ED Triage Vitals  Enc Vitals Group     BP 01/26/23 2003 (!) 154/102     Pulse Rate 01/26/23 2003 (!) 106     Resp 01/26/23 2003 14     Temp 01/26/23 2003 98 F (36.7 C)     Temp Source 01/26/23 2003 Oral     SpO2 01/26/23 2003 100 % (on 2 L)     Weight --      Height --      Head Circumference --      Peak Flow --      Pain Score 01/26/23 2004 10     Pain Loc --      Pain Edu? --      Excl. in GC? --     Most recent vital signs: Vitals:   01/27/23 0630 01/27/23 0700  BP: (!) 149/93 (!) 153/111  Pulse: 93 94  Resp: 13 13  Temp:    SpO2: 96% 97%    General: Awake, no obvious distress unless she moves her left arm. CV:  Good peripheral perfusion.  Tachycardia with a rate of around 115.  Normal heart sounds. Resp:  Normal effort. Speaking easily and comfortably, no accessory muscle usage nor intercostal  retractions.  Lungs are clear to auscultation. Abd:  No distention.  No tenderness to palpation at this time in her epigastrium or right upper quadrant.  No lower abdominal tenderness.  No rebound or guarding. Other:  No obvious deformity but substantial tenderness to palpation and manipulation of the left upper arm consistent with probable fracture.   ED Results / Procedures / Treatments   Labs (all labs ordered are listed, but only abnormal results are displayed) Labs Reviewed  COMPREHENSIVE METABOLIC PANEL - Abnormal; Notable for the following components:      Result Value   Sodium 132 (*)    CO2 20 (*)    Glucose, Bld 136 (*)    Total Protein 8.4 (*)    All other components within normal limits  CBC - Abnormal; Notable for the following components:   WBC 12.4 (*)    MCV 79.1 (*)    MCH 24.7 (*)    All other components within normal limits  LACTIC ACID, PLASMA - Abnormal; Notable for the following  components:   Lactic Acid, Venous 2.9 (*)    All other components within normal limits  URINALYSIS, W/ REFLEX TO CULTURE (INFECTION SUSPECTED) - Abnormal; Notable for the following components:   Color, Urine YELLOW (*)    APPearance CLEAR (*)    Specific Gravity, Urine >1.046 (*)    All other components within normal limits  LACTIC ACID, PLASMA - Abnormal; Notable for the following components:   Lactic Acid, Venous 4.7 (*)    All other components within normal limits  RESP PANEL BY RT-PCR (RSV, FLU A&B, COVID)  RVPGX2  CULTURE, BLOOD (ROUTINE X 2)  CULTURE, BLOOD (ROUTINE X 2)  LIPASE, BLOOD  BRAIN NATRIURETIC PEPTIDE  LACTIC ACID, PLASMA  LACTIC ACID, PLASMA  PROCALCITONIN  LACTIC ACID, PLASMA  LACTIC ACID, PLASMA  TROPONIN I (HIGH SENSITIVITY)     EKG  ED ECG REPORT I, Loleta Rose, the attending physician, personally viewed and interpreted this ECG.  Date: 01/27/2023 EKG Time: 00: 52 Rate: 105 Rhythm: Sinus tachycardia QRS Axis: normal Intervals: normal ST/T  Wave abnormalities: Non-specific ST segment / T-wave changes, but no clear evidence of acute ischemia. Narrative Interpretation: no definitive evidence of acute ischemia; does not meet STEMI criteria.    RADIOLOGY I viewed and interpreted the patient's CT head, CT cervical spine, CT abdomen/pelvis with IV contrast, chest x-ray, and left shoulder x-rays.  The patient's head and C-spine CTs were reassuring.  No obvious abnormalities on CT abdomen/pelvis.  As documented below, she has bronchitic changes on her chest x-ray but no obvious pneumonia.  Her left shoulder x-rays show obvious fracture to the proximal humerus.  Radiology reports concur with all of these findings.   PROCEDURES:  Critical Care performed: Yes, see critical care procedure note(s)  .1-3 Lead EKG Interpretation  Performed by: Loleta Rose, MD Authorized by: Loleta Rose, MD     Interpretation: abnormal     ECG rate:  115   ECG rate assessment: tachycardic     Rhythm: sinus tachycardia     Ectopy: none     Conduction: normal   .Critical Care  Performed by: Loleta Rose, MD Authorized by: Loleta Rose, MD   Critical care provider statement:    Critical care time (minutes):  45   Critical care time was exclusive of:  Separately billable procedures and treating other patients   Critical care was necessary to treat or prevent imminent or life-threatening deterioration of the following conditions:  Sepsis   Critical care was time spent personally by me on the following activities:  Development of treatment plan with patient or surrogate, evaluation of patient's response to treatment, examination of patient, obtaining history from patient or surrogate, ordering and performing treatments and interventions, ordering and review of laboratory studies, ordering and review of radiographic studies, pulse oximetry, re-evaluation of patient's condition and review of old charts     IMPRESSION / MDM / ASSESSMENT AND PLAN /  ED COURSE  I reviewed the triage vital signs and the nursing notes.                              Differential diagnosis includes, but is not limited to, fracture, dislocation, acute infectious process, acute electrolyte or metabolic abnormality.  Patient's presentation is most consistent with acute presentation with potential threat to life or bodily function.  Labs/studies ordered: Left shoulder x-rays, CT cervical spine, CT head, CT abdomen/pelvis with contrast, 1 view chest x-ray, lactic acid,  BNP, respiratory viral panel, CMP, CBC, lipase, blood cultures x 2, high-sensitivity troponin, urinalysis  Interventions/Medications given:  Medications  cefTRIAXone (ROCEPHIN) 2 g in sodium chloride 0.9 % 100 mL IVPB (0 g Intravenous Stopped 01/27/23 0518)  azithromycin (ZITHROMAX) 500 mg in sodium chloride 0.9 % 250 mL IVPB (0 mg Intravenous Stopped 01/27/23 0640)  morphine (PF) 2 MG/ML injection 2 mg (2 mg Intravenous Given 01/27/23 0520)  iohexol (OMNIPAQUE) 300 MG/ML solution 100 mL (100 mLs Intravenous Contrast Given 01/26/23 2356)  morphine (PF) 2 MG/ML injection 2 mg (2 mg Intravenous Given 01/27/23 0039)  ondansetron (ZOFRAN) injection 4 mg (4 mg Intravenous Given 01/27/23 0039)  sodium chloride 0.9 % bolus 500 mL (0 mLs Intravenous Stopped 01/27/23 0305)  lactated ringers bolus 750 mL (0 mLs Intravenous Stopped 01/27/23 0547)    And  lactated ringers bolus 1,000 mL (0 mLs Intravenous Stopped 01/27/23 0518)    (Note:  hospital course my include additional interventions and/or labs/studies not listed above.)   As document above, patient has obvious proximal humerus fracture.  I consulted by phone with Dr. Hyacinth Meeker with orthopedic service.  He recommended sling or shoulder immobilizer and if the patient is able to be discharged, he will see her in clinic next week but these injuries are typically nonoperative in this patient population.  However he said if the patient stays in the hospital, he will  see her as a consult.  I am worried, however, about the patient's vital signs.  She has been borderline hypoxic and required supplemental oxygen which is atypical for her.  Additionally she has been persistently tachycardic even after a 500 mL normal saline IV bolus and after analgesia.  She also has a mild leukocytosis of 12.4, normal urinalysis, normal respiratory viral panel, and essentially normal comprehensive metabolic panel.  Given the constellation of symptoms and vital sign abnormalities, I ordered additional labs including BNP, lactic acid, and chest x-ray, we will further evaluate the possibility of acute infectious process leading to her vital sign abnormalities.  The patient is on the cardiac monitor to evaluate for evidence of arrhythmia and/or significant heart rate changes.   Clinical Course as of 01/27/23 0811  Sun Jan 27, 2023  0250 DG Chest [facility] I viewed and interpreted the patient's chest x-ray. no large lobar pneumonia but probable bronchitis [CF]  0332 B Natriuretic Peptide: 40.4 No BNP elevation suggestive of new onset heart failure [CF]  0402 Patient's lactic acid came back at 2.9.  Given the combination of tachycardia, borderline hypoxia, leukocytosis, and elevated lactic acid, she meets criteria for severe sepsis even though I think that there are other contributing factors (such as the fall.  I ordered an additional 1.75 L of LR to meet the 30 mL/kg IV fluid goal based on a weight of approximately 69 kg.  I also ordered ceftriaxone 2 g IV and azithromycin 500 mg IV for broad-spectrum coverage for possible pneumonia based on the bronchitic changes previously documented on the chest x-ray.    [CF]  8938 Consulted with Dr. Para March with the hospitalist service who will admit the patient. [CF]    Clinical Course User Index [CF] Loleta Rose, MD     FINAL CLINICAL IMPRESSION(S) / ED DIAGNOSES   Final diagnoses:  Fall, initial encounter  Closed fracture of  proximal end of left humerus, unspecified fracture morphology, initial encounter  Severe sepsis  Bronchitis     Rx / DC Orders   ED Discharge Orders  None        Note:  This document was prepared using Dragon voice recognition software and may include unintentional dictation errors.   Loleta Rose, MD 01/27/23 [phone]

## 2023-01-27 NOTE — Assessment & Plan Note (Signed)
Slightly elevated, likely secondary to pain Continue home metoprolol, losartan and diltiazem

## 2023-01-27 NOTE — Assessment & Plan Note (Addendum)
Possible sepsis from possible respiratory tract infection/acute bronchitis Patient was tachycardic, mild hypoxemia of 90 with leukocytosis and lactic acidosis but no definite source of infection and patient denied acute complaints prior to her fall Treated with sepsis fluids and antibiotics for possible respiratory tract infection, given mild bronchitis seen on chest x-ray - Follow-up respiratory viral panel - Holding off on antibiotics pending procalcitonin - Will allow for completion of sepsis fluids - Continue supplemental oxygen to keep sats over 94

## 2023-01-27 NOTE — Assessment & Plan Note (Signed)
Patient reports her diarrhea is at baseline Continue loperamide as needed

## 2023-01-27 NOTE — Assessment & Plan Note (Addendum)
Accidental fall suspected Continue immobilizer sling Pain control PT eval Nonoperative per Dr. Hyacinth Meeker (consulted from the ED) Outpatient follow-up with Ortho

## 2023-01-27 NOTE — ED Notes (Signed)
Pt was placed on 1L of O2 as her oxygen started dipping down to 90-91% on RA--this is prior to the morphine administration.  Pt states that she isn't normally on oxygen at home either.  Provider made aware.

## 2023-01-27 NOTE — Final Progress Note (Addendum)
Same day rounding progress note  Seen and examined while in the ED.  Waiting for the floor bed.  I agree with assessment and plan dictated in H&P by Dr. Para March.  Please see that for further details.  Acute bronchitis: Negative respiratory panel, influenza, COVID.  Provide symptomatic management  Humeral neck fracture status post fall Continue sling immobilizer. Dr. Deeann Saint to see later today  Son updated at bedside  Time spent: 20 minutes

## 2023-01-27 NOTE — ED Notes (Signed)
New purewick applied.

## 2023-01-27 NOTE — ED Notes (Signed)
Pt status and plan of care explained at length with family member at bedside, no further questions to this RN.

## 2023-01-27 NOTE — Sepsis Progress Note (Signed)
Elink following code sepsis °

## 2023-01-27 NOTE — ED Notes (Signed)
Charge nurse found a supply of NS. Restarted NS at 147mL/hr.

## 2023-01-28 ENCOUNTER — Encounter: Payer: Self-pay | Admitting: Internal Medicine

## 2023-01-28 LAB — CBC
HCT: 32.1 % — ABNORMAL LOW (ref 36.0–46.0)
Hemoglobin: 9.8 g/dL — ABNORMAL LOW (ref 12.0–15.0)
MCH: 24.7 pg — ABNORMAL LOW (ref 26.0–34.0)
MCHC: 30.5 g/dL (ref 30.0–36.0)
MCV: 80.9 fL (ref 80.0–100.0)
Platelets: 211 10*3/uL (ref 150–400)
RBC: 3.97 MIL/uL (ref 3.87–5.11)
RDW: 14.6 % (ref 11.5–15.5)
WBC: 6.7 10*3/uL (ref 4.0–10.5)
nRBC: 0 % (ref 0.0–0.2)

## 2023-01-28 LAB — BASIC METABOLIC PANEL
Anion gap: 7 (ref 5–15)
BUN: 17 mg/dL (ref 8–23)
CO2: 25 mmol/L (ref 22–32)
Calcium: 8.5 mg/dL — ABNORMAL LOW (ref 8.9–10.3)
Chloride: 100 mmol/L (ref 98–111)
Creatinine, Ser: 1.06 mg/dL — ABNORMAL HIGH (ref 0.44–1.00)
GFR, Estimated: 53 mL/min — ABNORMAL LOW (ref >60)
Glucose, Bld: 96 mg/dL (ref 70–99)
Potassium: 3.7 mmol/L (ref 3.5–5.1)
Sodium: 132 mmol/L — ABNORMAL LOW (ref 135–145)

## 2023-01-28 LAB — CULTURE, BLOOD (ROUTINE X 2): Culture: NO GROWTH

## 2023-01-28 MED ORDER — POLYETHYLENE GLYCOL 3350 17 G PO PACK
17.0000 g | PACK | Freq: Every day | ORAL | Status: DC
  Administered 2023-01-28: 17 g via ORAL
  Filled 2023-01-28: qty 1

## 2023-01-28 MED ORDER — ONDANSETRON 4 MG PO TBDP: 4.0000 mg | ORAL_TABLET | Freq: Three times a day (TID) | ORAL | 0 refills | Status: AC | PRN

## 2023-01-28 MED ORDER — HYDROCODONE-ACETAMINOPHEN 5-325 MG PO TABS: 1.0000 | ORAL_TABLET | Freq: Four times a day (QID) | ORAL | 0 refills | Status: AC | PRN

## 2023-01-28 MED ORDER — PRAVASTATIN SODIUM 40 MG PO TABS: 40.0000 mg | ORAL_TABLET | Freq: Every day | ORAL | 3 refills | Status: AC

## 2023-01-28 MED ORDER — AZITHROMYCIN 500 MG PO TABS: 500.0000 mg | ORAL_TABLET | Freq: Every day | ORAL | 0 refills | Status: AC

## 2023-01-28 MED ORDER — SENNOSIDES-DOCUSATE SODIUM 8.6-50 MG PO TABS
2.0000 | ORAL_TABLET | Freq: Two times a day (BID) | ORAL | Status: DC
  Administered 2023-01-28: 2 via ORAL
  Filled 2023-01-28: qty 2

## 2023-01-28 MED ORDER — IBUPROFEN 400 MG PO TABS: 400.0000 mg | ORAL_TABLET | Freq: Four times a day (QID) | ORAL | 0 refills | Status: AC | PRN

## 2023-01-28 NOTE — TOC Transition Note (Signed)
Transition of Care [facility]) - CM/SW Discharge Note   Patient Details  Name: Charlene Thomas MRN: [id number] Date of Birth: 02-25-1946  Transition of Care [facility]) CM/SW Contact:  Margarito Liner, LCSW Phone Number: 01/28/2023, 12:07 PM   Clinical Narrative:  Patient has orders to discharge home today. Per MD, family interested in home health PT, OT, aide and a BSC and 4-prong cane. CSW met with patient and daughter-in-law to confirm. Set up with [facility] and asked them to contact daughter-in-law as soon as possible to arrange initial visit. Ordered DME through [employer]. No further concerns. CSW signing off.   Final next level of care: Home w Home Health Services Barriers to Discharge: Barriers Resolved   Patient Goals and CMS Choice CMS Medicare.gov Compare Post Acute Care list provided to:: Other (Comment Required) (Daughter-in-law) Choice offered to / list presented to :  (Daughter-in-law)  Discharge Placement                  Patient to be transferred to facility by: Daughter-in-law Name of family member notified: Lilly Spenser Patient and family notified of of transfer: 01/28/23  Discharge Plan and Services Additional resources added to the After Visit Summary for                  DME Arranged: 3-N-1, Cane (4 prong cane) DME Agency: [employer] Date DME Agency Contacted: 01/28/23   Representative spoke with at DME Agency: Tommye Standard HH Arranged: PT, OT, Nurse's Aide [facility] Agency: [facility] Date [facility] Agency Contacted: 01/28/23   Representative spoke with at [facility] Agency: Lorenza Chick  Social Determinants of Health (SDOH) Interventions SDOH Screenings   Food Insecurity: No Food Insecurity (01/28/2023)  Housing: Low Risk  (01/28/2023)  Transportation Needs: No Transportation Needs (01/28/2023)  Utilities: Not At Risk (01/28/2023)  Tobacco Use: Medium Risk (01/28/2023)     Readmission Risk Interventions     No data to display

## 2023-01-28 NOTE — Evaluation (Signed)
Occupational Therapy Evaluation Patient Details Name: Charlene Thomas MRN: [id number] DOB: 27-Jun-1946 Today's Date: 01/28/2023   History of Present Illness 77 y.o. female who complains of left shoulder pain after a fall at home.   Clinical Impression   Pt seen for OT evaluation and co-tx with PT for ADL mobility. Pt endorsing pain in L shoulder, sling malaligned upon OT's arrival. DIL present and supportive. Pt reports being independent with ADL at baseline, using SPC vs rollator vs RW vs no AD for mobility. Pt reports falling while outside in the yard. DIL endorses 2-5 falls per week lately. Pt required MOD A for bed mobility, VC to redirect to task throughout session. Pt very unsteady, tendency to lean back on her heels. She requires MAX A for LB ADL tasks, set up and CGA - MIN A for pericare, and CGA-MIN A +1-2 for ADL transfers and short distance mobility with shuffling steps and VC to pick feet up. Pt very high falls risk and risk for readmission. Recommend additional OT services + BSC and w/c. to maximize indep.       Recommendations for follow up therapy are one component of a multi-disciplinary discharge planning process, led by the attending physician.  Recommendations may be updated based on patient status, additional functional criteria and insurance authorization.   Assistance Recommended at Discharge Frequent or constant Supervision/Assistance  Patient can return home with the following A little help with walking and/or transfers;A lot of help with bathing/dressing/bathroom;Assistance with cooking/housework;Help with stairs or ramp for entrance;Assist for transportation;Direct supervision/assist for medications management    Functional Status Assessment  Patient has had a recent decline in their functional status and demonstrates the ability to make significant improvements in function in a reasonable and predictable amount of time.  Equipment Recommendations  BSC/3in1     Recommendations for Other Services       Precautions / Restrictions Precautions Precautions: Shoulder Type of Shoulder Precautions: shoulder in sling until follow up ortho appt Shoulder Interventions: Shoulder sling/immobilizer Restrictions Weight Bearing Restrictions: No Other Position/Activity Restrictions: no formal WBing restrictions noted, assumed NWBing given injury      Mobility Bed Mobility Overal bed mobility: Needs Assistance Bed Mobility: Supine to Sit     Supine to sit: Mod assist, HOB elevated     General bed mobility comments: VC for sequencing, easily distracted    Transfers Overall transfer level: Needs assistance Equipment used: 1 person hand held assist Transfers: Sit to/from Stand Sit to Stand: +2 physical assistance, +2 safety/equipment, Min assist           General transfer comment: shuffled steps, VC for sequencing, MIN A +2      Balance Overall balance assessment: Needs assistance Sitting-balance support: Feet supported, No upper extremity supported, Single extremity supported Sitting balance-Leahy Scale: Fair     Standing balance support: Single extremity supported, During functional activity, Reliant on assistive device for balance Standing balance-Leahy Scale: Poor Standing balance comment: requires assist to maintain static and dynamic balance                           ADL either performed or assessed with clinical judgement   ADL Overall ADL's : Needs assistance/impaired                                       General ADL Comments: Pt currently requires  MAX A for LB ADL tasks, set up for seated/lateral lean pericare with PRN MIN A for thoroughness, MOD A for UB ADL due to decreased LUE functional use, LUE pain, generalized weakness, and decreased balance.     Vision         Perception     Praxis      Pertinent Vitals/Pain Pain Assessment Pain Assessment: Faces Faces Pain Scale: Hurts little  more Pain Location: L shoulder Pain Descriptors / Indicators: Aching Pain Intervention(s): Limited activity within patient's tolerance, Monitored during session, Repositioned     Hand Dominance Right   Extremity/Trunk Assessment Upper Extremity Assessment Upper Extremity Assessment: Generalized weakness;LUE deficits/detail LUE Deficits / Details: in sling, painful, sling repositioned for improved fit LUE: Unable to fully assess due to pain;Unable to fully assess due to immobilization;Shoulder pain at rest LUE Sensation: WNL   Lower Extremity Assessment Lower Extremity Assessment: Generalized weakness       Communication Communication Communication: No difficulties   Cognition Arousal/Alertness: Awake/alert Behavior During Therapy: WFL for tasks assessed/performed Overall Cognitive Status: Within Functional Limits for tasks assessed                                 General Comments: pt requiring VC to redirect to task, decreased recall of DME at home     General Comments       Exercises Other Exercises Other Exercises: Pt educated in home/routines modifications for ADL, sling mgt and positioning, falls prevention   Shoulder Instructions      Home Living Family/patient expects to be discharged to:: Private residence Living Arrangements: Alone Available Help at Discharge: Family;Available 24 hours/day (DIL plans to stay with her as long as needed and other family can help PRN) Type of Home: House Home Access: Ramped entrance     Home Layout: Two level;Able to live on main level with bedroom/bathroom     Bathroom Shower/Tub: Arts development officer Toilet: Handicapped height     Home Equipment: Agricultural consultant (2 wheels);Rollator (4 wheels);Cane - single point;Shower seat - built in;Grab bars - toilet;Grab bars - tub/shower;Other (comment);Wheelchair - manual   Additional Comments: has a bed rail      Prior Functioning/Environment Prior Level of  Function : History of Falls (last six months);Needs assist             Mobility Comments: using SPC versus rollator for most mobility but DIL reports she sometimes walks without AD ADLs Comments: Pt reports typically indep wiht basic ADL, sometimes messes up her medications but has family to help, and was able to complete cooking/cleaning with PRN assist. DIL reports 2-5 falls per week lately        OT Problem List: Decreased strength;Pain;Decreased activity tolerance;Decreased safety awareness;Decreased knowledge of use of DME or AE;Impaired balance (sitting and/or standing);Impaired UE functional use      OT Treatment/Interventions: Self-care/ADL training;Therapeutic exercise;Therapeutic activities;DME and/or AE instruction;Patient/family education;Balance training    OT Goals(Current goals can be found in the care plan section) Acute Rehab OT Goals Patient Stated Goal: go home OT Goal Formulation: With patient/family Time For Goal Achievement: 02/11/23 Potential to Achieve Goals: Good  OT Frequency: Min 2X/week    Co-evaluation PT/OT/SLP Co-Evaluation/Treatment: Yes Reason for Co-Treatment: For patient/therapist safety;To address functional/ADL transfers PT goals addressed during session: Mobility/safety with mobility;Balance;Proper use of DME OT goals addressed during session: ADL's and self-care;Proper use of [employer] equipment and DME  AM-PAC OT "6 Clicks" Daily Activity     Outcome Measure Help from another person eating meals?: None Help from another person taking care of personal grooming?: A Little Help from another person toileting, which includes using toliet, bedpan, or urinal?: A Little Help from another person bathing (including washing, rinsing, drying)?: A Lot Help from another person to put on and taking off regular upper body clothing?: A Lot Help from another person to put on and taking off regular lower body clothing?: A Lot 6 Click Score: 16   End  of Session Nurse Communication: Mobility status  Activity Tolerance: Patient tolerated treatment well Patient left: in bed;with call bell/phone within reach;with bed alarm set  OT Visit Diagnosis: Unsteadiness on feet (R26.81);Repeated falls (R29.6);Muscle weakness (generalized) (M62.81);Pain Pain - Right/Left: Left Pain - part of body: Shoulder                Time: 2130-8657 OT Time Calculation (min): 38 min Charges:  OT General Charges $OT Visit: 1 Visit OT Evaluation $OT Eval Moderate Complexity: 1 Mod OT Treatments $Self Care/Home Management : 8-22 mins  Arman Filter., MPH, MS, OTR/L ascom [phone] 01/28/23, 1:00 PM

## 2023-01-28 NOTE — TOC CM/SW Note (Signed)
Patient is not able to walk the distance required to go the bathroom, or he/she is unable to safely negotiate stairs required to access the bathroom.  A 3in1 BSC will alleviate this problem  

## 2023-01-28 NOTE — Evaluation (Signed)
Physical Therapy Evaluation Patient Details Name: Charlene Thomas MRN: [id number] DOB: [birth year] Today's Date: 01/28/2023  History of Present Illness  Pt is a 77 y.o. female with medical history significant for HTN, hypothyroidism, chronic diarrhea following cholecystectomy several years ago who presents to the ED for evaluation of left arm pain following a fall at home. Imaging showed "Suspected three-part left proximal humerus fracture with dominant  nondisplaced transverse humeral neck fracture."   Clinical Impression  Patient alert, with OT upon PT entrance. Oriented to name, displayed moderate pain signs/symptoms of LUE. Supine <> sit modA, extra time. Pt with fair sitting balance, ultimately minAx2 for sit <> stand and step pivot to [facility]. Able to perform pericare with CGA as well. She ambulated ~57ft with handheld assist, very shuffled assist. Pt very nervous thorughout.  Overall the patient demonstrated deficits (see "PT Problem List") that impede the patient's functional abilities, safety, and mobility and would benefit from skilled PT intervention. Recommendation is to continue skilled PT services to return to PLOF.        Recommendations for follow up therapy are one component of a multi-disciplinary discharge planning process, led by the attending physician.  Recommendations may be updated based on patient status, additional functional criteria and insurance authorization.  Follow Up Recommendations Can patient physically be transported by private vehicle: Yes     Assistance Recommended at Discharge Frequent or constant Supervision/Assistance  Patient can return home with the following  A lot of help with bathing/dressing/bathroom;Assistance with cooking/housework;Direct supervision/assist for medications management;Assist for transportation;A lot of help with walking and/or transfers;Help with stairs or ramp for entrance    Equipment Recommendations Wheelchair (measurements PT)   Recommendations for Other Services       Functional Status Assessment Patient has had a recent decline in their functional status and demonstrates the ability to make significant improvements in function in a reasonable and predictable amount of time.     Precautions / Restrictions Precautions Precautions: Shoulder Type of Shoulder Precautions: shoulder in sling until follow up ortho appt Shoulder Interventions: Shoulder sling/immobilizer Restrictions Weight Bearing Restrictions: No Other Position/Activity Restrictions: no formal WBing restrictions noted, assumed NWBing given injury      Mobility  Bed Mobility Overal bed mobility: Needs Assistance Bed Mobility: Supine to Sit     Supine to sit: Mod assist, HOB elevated     General bed mobility comments: VC for sequencing, easily distracted    Transfers Overall transfer level: Needs assistance Equipment used: 1 person hand held assist Transfers: Sit to/from Stand Sit to Stand: +2 physical assistance, +2 safety/equipment, Min assist           General transfer comment: shuffled steps, VC for sequencing, MIN A +2    Ambulation/Gait   Gait Distance (Feet): 5 Feet Assistive device: 1 person hand held assist         General Gait Details: very shuffled steps, needed significant encouragement, pt fearful throughout  Stairs            Wheelchair Mobility    Modified Rankin (Stroke Patients Only)       Balance Overall balance assessment: Needs assistance Sitting-balance support: Feet supported, No upper extremity supported, Single extremity supported Sitting balance-Leahy Scale: Fair     Standing balance support: Single extremity supported, During functional activity, Reliant on assistive device for balance Standing balance-Leahy Scale: Poor  Pertinent Vitals/Pain Pain Assessment Pain Assessment: Faces Faces Pain Scale: Hurts little more Pain Location: L  shoulder Pain Descriptors / Indicators: Aching Pain Intervention(s): Limited activity within patient's tolerance, Repositioned, Monitored during session    Home Living Family/patient expects to be discharged to:: Private residence Living Arrangements: Alone Available Help at Discharge: Family;Available 24 hours/day (DIL plans to stay with her as long as needed and other family can help PRN) Type of Home: House Home Access: Ramped entrance       Home Layout: Two level;Able to live on main level with bedroom/bathroom Home Equipment: Rolling Walker (2 wheels);Rollator (4 wheels);Cane - single point;Shower seat - built in;Grab bars - toilet;Grab bars - tub/shower;Other (comment);Wheelchair - manual Additional Comments: has a bed rail    Prior Function Prior Level of Function : History of Falls (last six months);Needs assist             Mobility Comments: using SPC versus rollator for most mobility but DIL reports she sometimes walks without AD ADLs Comments: Pt reports typically indep wiht basic ADL, sometimes messes up her medications but has family to help, and was able to complete cooking/cleaning with PRN assist. DIL reports 2-5 falls per week lately     Hand Dominance   Dominant Hand: Right    Extremity/Trunk Assessment   Upper Extremity Assessment Upper Extremity Assessment: Defer to OT evaluation LUE Deficits / Details: in sling, painful, sling repositioned for improved fit LUE: Unable to fully assess due to pain;Unable to fully assess due to immobilization;Shoulder pain at rest LUE Sensation: WNL    Lower Extremity Assessment Lower Extremity Assessment: Generalized weakness       Communication   Communication: No difficulties  Cognition Arousal/Alertness: Awake/alert Behavior During Therapy: WFL for tasks assessed/performed Overall Cognitive Status: Within Functional Limits for tasks assessed                                 General Comments: pt  requiring VC to redirect to task, decreased recall of DME at home        General Comments      Exercises     Assessment/Plan    PT Assessment Patient needs continued PT services  PT Problem List Decreased strength;Decreased mobility;Decreased range of motion;Decreased activity tolerance;Decreased balance       PT Treatment Interventions DME instruction;Gait training;Therapeutic activities;Therapeutic exercise;Patient/family education;Stair training;Balance training;Functional mobility training;Neuromuscular re-education    PT Goals (Current goals can be found in the Care Plan section)  Acute Rehab PT Goals Patient Stated Goal: to go home PT Goal Formulation: With patient/family Time For Goal Achievement: 02/11/23 Potential to Achieve Goals: Good    Frequency Min 2X/week     Co-evaluation PT/OT/SLP Co-Evaluation/Treatment: Yes Reason for Co-Treatment: For patient/therapist safety;To address functional/ADL transfers PT goals addressed during session: Mobility/safety with mobility;Balance;Proper use of DME OT goals addressed during session: ADL's and self-care;Proper use of Adaptive equipment and DME       AM-PAC PT "6 Clicks" Mobility  Outcome Measure Help needed turning from your back to your side while in a flat bed without using bedrails?: A Little Help needed moving from lying on your back to sitting on the side of a flat bed without using bedrails?: A Little Help needed moving to and from a bed to a chair (including a wheelchair)?: A Little Help needed standing up from a chair using your arms (e.g., wheelchair or bedside chair)?: A Lot  Help needed to walk in hospital room?: A Lot Help needed climbing 3-5 steps with a railing? : Total 6 Click Score: 14    End of Session Equipment Utilized During Treatment: Other (comment) (L shoulder sling) Activity Tolerance: Patient tolerated treatment well Patient left: in bed;with call bell/phone within reach;with bed alarm  set Nurse Communication: Mobility status PT Visit Diagnosis: Other abnormalities of gait and mobility (R26.89);Difficulty in walking, not elsewhere classified (R26.2)    Time: 9604-5409 PT Time Calculation (min) (ACUTE ONLY): 31 min   Charges:   PT Evaluation $PT Eval Low Complexity: 1 Low PT Treatments $Therapeutic Activity: 8-22 mins        Olga Coaster PT, DPT 1:03 PM,01/28/23

## 2023-01-29 LAB — CULTURE, BLOOD (ROUTINE X 2)

## 2023-01-30 DIAGNOSIS — S42202A Unspecified fracture of upper end of left humerus, initial encounter for closed fracture: Secondary | ICD-10-CM | POA: Diagnosis not present

## 2023-01-31 DIAGNOSIS — I499 Cardiac arrhythmia, unspecified: Secondary | ICD-10-CM | POA: Diagnosis not present

## 2023-01-31 DIAGNOSIS — R197 Diarrhea, unspecified: Secondary | ICD-10-CM | POA: Diagnosis not present

## 2023-01-31 DIAGNOSIS — E039 Hypothyroidism, unspecified: Secondary | ICD-10-CM | POA: Diagnosis not present

## 2023-01-31 DIAGNOSIS — R002 Palpitations: Secondary | ICD-10-CM | POA: Diagnosis not present

## 2023-01-31 DIAGNOSIS — M47819 Spondylosis without myelopathy or radiculopathy, site unspecified: Secondary | ICD-10-CM | POA: Diagnosis not present

## 2023-01-31 DIAGNOSIS — J40 Bronchitis, not specified as acute or chronic: Secondary | ICD-10-CM | POA: Diagnosis not present

## 2023-01-31 DIAGNOSIS — K219 Gastro-esophageal reflux disease without esophagitis: Secondary | ICD-10-CM | POA: Diagnosis not present

## 2023-01-31 DIAGNOSIS — I10 Essential (primary) hypertension: Secondary | ICD-10-CM | POA: Diagnosis not present

## 2023-01-31 DIAGNOSIS — S42292D Other displaced fracture of upper end of left humerus, subsequent encounter for fracture with routine healing: Secondary | ICD-10-CM | POA: Diagnosis not present

## 2023-01-31 LAB — CULTURE, BLOOD (ROUTINE X 2): Special Requests: ADEQUATE

## 2023-02-01 DIAGNOSIS — S42292D Other displaced fracture of upper end of left humerus, subsequent encounter for fracture with routine healing: Secondary | ICD-10-CM | POA: Diagnosis not present

## 2023-02-01 DIAGNOSIS — R002 Palpitations: Secondary | ICD-10-CM | POA: Diagnosis not present

## 2023-02-01 DIAGNOSIS — M47819 Spondylosis without myelopathy or radiculopathy, site unspecified: Secondary | ICD-10-CM | POA: Diagnosis not present

## 2023-02-01 DIAGNOSIS — R197 Diarrhea, unspecified: Secondary | ICD-10-CM | POA: Diagnosis not present

## 2023-02-01 DIAGNOSIS — E039 Hypothyroidism, unspecified: Secondary | ICD-10-CM | POA: Diagnosis not present

## 2023-02-01 DIAGNOSIS — I10 Essential (primary) hypertension: Secondary | ICD-10-CM | POA: Diagnosis not present

## 2023-02-01 DIAGNOSIS — I499 Cardiac arrhythmia, unspecified: Secondary | ICD-10-CM | POA: Diagnosis not present

## 2023-02-01 DIAGNOSIS — K219 Gastro-esophageal reflux disease without esophagitis: Secondary | ICD-10-CM | POA: Diagnosis not present

## 2023-02-01 DIAGNOSIS — J40 Bronchitis, not specified as acute or chronic: Secondary | ICD-10-CM | POA: Diagnosis not present

## 2023-02-01 LAB — CULTURE, BLOOD (ROUTINE X 2)
Culture: NO GROWTH
Special Requests: ADEQUATE

## 2023-02-05 DIAGNOSIS — R42 Dizziness and giddiness: Secondary | ICD-10-CM | POA: Diagnosis not present

## 2023-02-05 DIAGNOSIS — R519 Headache, unspecified: Secondary | ICD-10-CM | POA: Diagnosis not present

## 2023-02-05 DIAGNOSIS — I499 Cardiac arrhythmia, unspecified: Secondary | ICD-10-CM | POA: Diagnosis not present

## 2023-02-05 DIAGNOSIS — H8111 Benign paroxysmal vertigo, right ear: Secondary | ICD-10-CM | POA: Diagnosis not present

## 2023-02-05 DIAGNOSIS — I10 Essential (primary) hypertension: Secondary | ICD-10-CM | POA: Diagnosis not present

## 2023-02-05 DIAGNOSIS — E039 Hypothyroidism, unspecified: Secondary | ICD-10-CM | POA: Diagnosis not present

## 2023-02-05 DIAGNOSIS — K219 Gastro-esophageal reflux disease without esophagitis: Secondary | ICD-10-CM | POA: Diagnosis not present

## 2023-02-05 DIAGNOSIS — S42292D Other displaced fracture of upper end of left humerus, subsequent encounter for fracture with routine healing: Secondary | ICD-10-CM | POA: Diagnosis not present

## 2023-02-05 DIAGNOSIS — M47819 Spondylosis without myelopathy or radiculopathy, site unspecified: Secondary | ICD-10-CM | POA: Diagnosis not present

## 2023-02-05 DIAGNOSIS — J40 Bronchitis, not specified as acute or chronic: Secondary | ICD-10-CM | POA: Diagnosis not present

## 2023-02-05 DIAGNOSIS — R002 Palpitations: Secondary | ICD-10-CM | POA: Diagnosis not present

## 2023-02-05 DIAGNOSIS — R197 Diarrhea, unspecified: Secondary | ICD-10-CM | POA: Diagnosis not present

## 2023-02-06 ENCOUNTER — Other Ambulatory Visit: Payer: Self-pay | Admitting: Otolaryngology

## 2023-02-06 DIAGNOSIS — R002 Palpitations: Secondary | ICD-10-CM | POA: Diagnosis not present

## 2023-02-06 DIAGNOSIS — M47819 Spondylosis without myelopathy or radiculopathy, site unspecified: Secondary | ICD-10-CM | POA: Diagnosis not present

## 2023-02-06 DIAGNOSIS — E039 Hypothyroidism, unspecified: Secondary | ICD-10-CM | POA: Diagnosis not present

## 2023-02-06 DIAGNOSIS — I499 Cardiac arrhythmia, unspecified: Secondary | ICD-10-CM | POA: Diagnosis not present

## 2023-02-06 DIAGNOSIS — R410 Disorientation, unspecified: Secondary | ICD-10-CM

## 2023-02-06 DIAGNOSIS — R413 Other amnesia: Secondary | ICD-10-CM

## 2023-02-06 DIAGNOSIS — I10 Essential (primary) hypertension: Secondary | ICD-10-CM | POA: Diagnosis not present

## 2023-02-06 DIAGNOSIS — J40 Bronchitis, not specified as acute or chronic: Secondary | ICD-10-CM | POA: Diagnosis not present

## 2023-02-06 DIAGNOSIS — S42292D Other displaced fracture of upper end of left humerus, subsequent encounter for fracture with routine healing: Secondary | ICD-10-CM | POA: Diagnosis not present

## 2023-02-06 DIAGNOSIS — K219 Gastro-esophageal reflux disease without esophagitis: Secondary | ICD-10-CM | POA: Diagnosis not present

## 2023-02-06 DIAGNOSIS — R197 Diarrhea, unspecified: Secondary | ICD-10-CM | POA: Diagnosis not present

## 2023-02-07 DIAGNOSIS — R002 Palpitations: Secondary | ICD-10-CM | POA: Diagnosis not present

## 2023-02-07 DIAGNOSIS — I10 Essential (primary) hypertension: Secondary | ICD-10-CM | POA: Diagnosis not present

## 2023-02-07 DIAGNOSIS — J40 Bronchitis, not specified as acute or chronic: Secondary | ICD-10-CM | POA: Diagnosis not present

## 2023-02-07 DIAGNOSIS — K219 Gastro-esophageal reflux disease without esophagitis: Secondary | ICD-10-CM | POA: Diagnosis not present

## 2023-02-07 DIAGNOSIS — R197 Diarrhea, unspecified: Secondary | ICD-10-CM | POA: Diagnosis not present

## 2023-02-07 DIAGNOSIS — I499 Cardiac arrhythmia, unspecified: Secondary | ICD-10-CM | POA: Diagnosis not present

## 2023-02-07 DIAGNOSIS — S42292D Other displaced fracture of upper end of left humerus, subsequent encounter for fracture with routine healing: Secondary | ICD-10-CM | POA: Diagnosis not present

## 2023-02-07 DIAGNOSIS — E039 Hypothyroidism, unspecified: Secondary | ICD-10-CM | POA: Diagnosis not present

## 2023-02-07 DIAGNOSIS — M47819 Spondylosis without myelopathy or radiculopathy, site unspecified: Secondary | ICD-10-CM | POA: Diagnosis not present

## 2023-02-08 DIAGNOSIS — I499 Cardiac arrhythmia, unspecified: Secondary | ICD-10-CM | POA: Diagnosis not present

## 2023-02-08 DIAGNOSIS — R270 Ataxia, unspecified: Secondary | ICD-10-CM | POA: Diagnosis not present

## 2023-02-08 DIAGNOSIS — R4182 Altered mental status, unspecified: Secondary | ICD-10-CM | POA: Diagnosis not present

## 2023-02-08 DIAGNOSIS — R7309 Other abnormal glucose: Secondary | ICD-10-CM | POA: Diagnosis not present

## 2023-02-08 DIAGNOSIS — K219 Gastro-esophageal reflux disease without esophagitis: Secondary | ICD-10-CM | POA: Diagnosis not present

## 2023-02-08 DIAGNOSIS — I10 Essential (primary) hypertension: Secondary | ICD-10-CM | POA: Diagnosis not present

## 2023-02-08 DIAGNOSIS — S42292D Other displaced fracture of upper end of left humerus, subsequent encounter for fracture with routine healing: Secondary | ICD-10-CM | POA: Diagnosis not present

## 2023-02-08 DIAGNOSIS — R197 Diarrhea, unspecified: Secondary | ICD-10-CM | POA: Diagnosis not present

## 2023-02-08 DIAGNOSIS — M47819 Spondylosis without myelopathy or radiculopathy, site unspecified: Secondary | ICD-10-CM | POA: Diagnosis not present

## 2023-02-08 DIAGNOSIS — R410 Disorientation, unspecified: Secondary | ICD-10-CM | POA: Diagnosis not present

## 2023-02-08 DIAGNOSIS — R002 Palpitations: Secondary | ICD-10-CM | POA: Diagnosis not present

## 2023-02-08 DIAGNOSIS — J40 Bronchitis, not specified as acute or chronic: Secondary | ICD-10-CM | POA: Diagnosis not present

## 2023-02-08 DIAGNOSIS — E782 Mixed hyperlipidemia: Secondary | ICD-10-CM | POA: Diagnosis not present

## 2023-02-08 DIAGNOSIS — E039 Hypothyroidism, unspecified: Secondary | ICD-10-CM | POA: Diagnosis not present

## 2023-02-08 DIAGNOSIS — Z79899 Other long term (current) drug therapy: Secondary | ICD-10-CM | POA: Diagnosis not present

## 2023-02-12 DIAGNOSIS — K219 Gastro-esophageal reflux disease without esophagitis: Secondary | ICD-10-CM | POA: Diagnosis not present

## 2023-02-12 DIAGNOSIS — I499 Cardiac arrhythmia, unspecified: Secondary | ICD-10-CM | POA: Diagnosis not present

## 2023-02-12 DIAGNOSIS — R197 Diarrhea, unspecified: Secondary | ICD-10-CM | POA: Diagnosis not present

## 2023-02-12 DIAGNOSIS — I10 Essential (primary) hypertension: Secondary | ICD-10-CM | POA: Diagnosis not present

## 2023-02-12 DIAGNOSIS — M47819 Spondylosis without myelopathy or radiculopathy, site unspecified: Secondary | ICD-10-CM | POA: Diagnosis not present

## 2023-02-12 DIAGNOSIS — J40 Bronchitis, not specified as acute or chronic: Secondary | ICD-10-CM | POA: Diagnosis not present

## 2023-02-12 DIAGNOSIS — R002 Palpitations: Secondary | ICD-10-CM | POA: Diagnosis not present

## 2023-02-12 DIAGNOSIS — E039 Hypothyroidism, unspecified: Secondary | ICD-10-CM | POA: Diagnosis not present

## 2023-02-12 DIAGNOSIS — S42292D Other displaced fracture of upper end of left humerus, subsequent encounter for fracture with routine healing: Secondary | ICD-10-CM | POA: Diagnosis not present

## 2023-02-13 ENCOUNTER — Other Ambulatory Visit: Payer: Self-pay | Admitting: Internal Medicine

## 2023-02-13 DIAGNOSIS — K219 Gastro-esophageal reflux disease without esophagitis: Secondary | ICD-10-CM | POA: Diagnosis not present

## 2023-02-13 DIAGNOSIS — E039 Hypothyroidism, unspecified: Secondary | ICD-10-CM | POA: Diagnosis not present

## 2023-02-13 DIAGNOSIS — J40 Bronchitis, not specified as acute or chronic: Secondary | ICD-10-CM | POA: Diagnosis not present

## 2023-02-13 DIAGNOSIS — M47819 Spondylosis without myelopathy or radiculopathy, site unspecified: Secondary | ICD-10-CM | POA: Diagnosis not present

## 2023-02-13 DIAGNOSIS — R197 Diarrhea, unspecified: Secondary | ICD-10-CM | POA: Diagnosis not present

## 2023-02-13 DIAGNOSIS — R002 Palpitations: Secondary | ICD-10-CM | POA: Diagnosis not present

## 2023-02-13 DIAGNOSIS — S42292D Other displaced fracture of upper end of left humerus, subsequent encounter for fracture with routine healing: Secondary | ICD-10-CM | POA: Diagnosis not present

## 2023-02-13 DIAGNOSIS — I499 Cardiac arrhythmia, unspecified: Secondary | ICD-10-CM | POA: Diagnosis not present

## 2023-02-13 DIAGNOSIS — R4182 Altered mental status, unspecified: Secondary | ICD-10-CM

## 2023-02-13 DIAGNOSIS — I10 Essential (primary) hypertension: Secondary | ICD-10-CM | POA: Diagnosis not present

## 2023-02-14 DIAGNOSIS — R002 Palpitations: Secondary | ICD-10-CM | POA: Diagnosis not present

## 2023-02-14 DIAGNOSIS — S42292D Other displaced fracture of upper end of left humerus, subsequent encounter for fracture with routine healing: Secondary | ICD-10-CM | POA: Diagnosis not present

## 2023-02-14 DIAGNOSIS — M47819 Spondylosis without myelopathy or radiculopathy, site unspecified: Secondary | ICD-10-CM | POA: Diagnosis not present

## 2023-02-14 DIAGNOSIS — K219 Gastro-esophageal reflux disease without esophagitis: Secondary | ICD-10-CM | POA: Diagnosis not present

## 2023-02-14 DIAGNOSIS — I499 Cardiac arrhythmia, unspecified: Secondary | ICD-10-CM | POA: Diagnosis not present

## 2023-02-14 DIAGNOSIS — J40 Bronchitis, not specified as acute or chronic: Secondary | ICD-10-CM | POA: Diagnosis not present

## 2023-02-14 DIAGNOSIS — I10 Essential (primary) hypertension: Secondary | ICD-10-CM | POA: Diagnosis not present

## 2023-02-14 DIAGNOSIS — R197 Diarrhea, unspecified: Secondary | ICD-10-CM | POA: Diagnosis not present

## 2023-02-14 DIAGNOSIS — E039 Hypothyroidism, unspecified: Secondary | ICD-10-CM | POA: Diagnosis not present

## 2023-02-14 NOTE — Discharge Summary (Signed)
Physician Discharge Summary   Patient: Charlene Thomas MRN: [id number] DOB: 1945/12/13  Admit date:     01/26/2023  Discharge date: 01/28/2023  Discharge Physician: Delfino Lovett   PCP: Marguarite Arbour, MD   Recommendations at discharge:    Follow up with outpt providers as requested  Discharge Diagnoses: Active Problems:   Fx humeral neck, left, closed, initial encounter   SIRS (systemic inflammatory response syndrome) (HCC)   Hypertension   Hypothyroidism   Chronic diarrhea   Fall   Bronchitis   Closed fracture of left proximal humerus   Severe sepsis [facility])   Hospital Course: Assessment and Plan: SIRS (systemic inflammatory response syndrome) (HCC) from acute bronchitis Sepsis ruled out Resolved with treatment.  Fx humeral neck, left, closed, initial encounter Accidental fall suspected Continue immobilizer sling, ice and analgesics as need. Nonoperative mgmt per Dr. Hyacinth Meeker - seen Outpatient follow-up with Ortho Dr Hyacinth Meeker  Chronic diarrhea Patient reports her diarrhea is at baseline Continue loperamide as needed  Hypothyroidism Continue levothyroxine  Hypertension Continue home metoprolol, losartan and diltiazem         Consultants: Ortho Disposition: Home health Diet recommendation:  Discharge Diet Orders (From admission, onward)     Start     Ordered   01/28/23 0000  Diet - low sodium heart healthy        01/28/23 1100           Carb modified diet DISCHARGE MEDICATION: Allergies as of 01/28/2023       Reactions   Aspirin    Celecoxib Other (See Comments)   Gi upset   Ezetimibe-simvastatin    Other Reaction(s): Muscle Pain   Mobic [meloxicam] Itching   Raloxifene    Other Reaction(s): Unknown Upset gi   Topiramate Other (See Comments)   Causes severe confusion        Medication List     STOP taking these medications    cyclobenzaprine 10 MG tablet Commonly known as: FLEXERIL   ondansetron 4 MG disintegrating  tablet Commonly known as: Zofran ODT       TAKE these medications    cetirizine 10 MG tablet Commonly known as: ZYRTEC Take 10 mg by mouth daily.   diltiazem 240 MG 24 hr capsule Commonly known as: CARDIZEM CD Take 240 mg by mouth daily.   DULoxetine 30 MG capsule Commonly known as: CYMBALTA Take 30 mg by mouth daily.   levothyroxine 125 MCG tablet Commonly known as: SYNTHROID Take 125 mcg by mouth daily.   losartan 100 MG tablet Commonly known as: COZAAR Take 100 mg by mouth daily.   metoprolol tartrate 25 MG tablet Commonly known as: LOPRESSOR Take 25 mg by mouth 2 (two) times daily.   omeprazole 20 MG capsule Commonly known as: PRILOSEC Take 20 mg by mouth 2 (two) times daily.   pravastatin 40 MG tablet Commonly known as: PRAVACHOL Take 1 tablet (40 mg total) by mouth daily.       ASK your doctor about these medications    azithromycin 500 MG tablet Commonly known as: Zithromax Take 1 tablet (500 mg total) by mouth daily for 3 days. Take 1 tablet daily for 3 days. Ask about: Should I take this medication?   HYDROcodone-acetaminophen 5-325 MG tablet Commonly known as: NORCO/VICODIN Take 1 tablet by mouth every 6 (six) hours as needed for up to 3 days for moderate pain or severe pain. Ask about: Should I take this medication?   ibuprofen 400 MG tablet Commonly known  as: ADVIL Take 1 tablet (400 mg total) by mouth every 6 (six) hours as needed for up to 10 days. Ask about: Should I take this medication?   ondansetron 4 MG disintegrating tablet Commonly known as: Zofran ODT Take 1 tablet (4 mg total) by mouth every 8 (eight) hours as needed for up to 10 days for nausea or vomiting. Ask about: Should I take this medication?        Follow-up Information     Marguarite Arbour, MD. Schedule an appointment as soon as possible for a visit in 1 week(s).   Specialty: Internal Medicine Why: [facility] Discharge F/UP Contact information: [address] [place] [address] [phone]         Care, [facility] Follow up.   Specialty: Home Health Services Why: They will follow up with you for your home health needs. Contact information: [address] [place] [address] [phone]                Discharge Exam: Ceasar Mons Weights   01/27/23 0523  Weight: 78.8 kg   Constitutional:      General: She is not in acute distress. HENT:     Head: Normocephalic and atraumatic.  Cardiovascular:     Rate and Rhythm: Regular rhythm. .     Heart sounds: Normal heart sounds.  Pulmonary:     Effort: Pulmonary effort is normal.     Breath sounds: Normal breath sounds.  Abdominal:     Palpations: Abdomen is soft.     Tenderness: There is no abdominal tenderness.  Musculoskeletal:     Comments: Left arm in sling  Neurological:     Mental Status: Mental status is at baseline.   Condition at discharge: fair  The results of significant diagnostics from this hospitalization (including imaging, microbiology, ancillary and laboratory) are listed below for reference.   Imaging Studies: DG Chest Port 1 View  Result Date: 01/27/2023 CLINICAL DATA:  Questionable sepsis-evaluate for abnormality. Left fracture shoulder EXAM: PORTABLE CHEST 1 VIEW COMPARISON:  Left shoulder radiographs 01/26/2023 and chest radiographs 04/18/2021 FINDINGS: Stable cardiomediastinal silhouette. Aortic atherosclerotic calcification. Low lung volumes accentuate pulmonary vascularity. Question mild perihilar and peribronchial thickening. No focal consolidation, pleural effusion, or pneumothorax. Mildly displaced fracture of the surgical neck of the left humerus. IMPRESSION: 1. Question mild bronchitis/reactive airways.  No focal pneumonia. 2. Mildly displaced fracture of the surgical neck of the left humerus. Electronically Signed   By: Minerva Fester M.D.   On: 01/27/2023 02:35   CT ABDOMEN PELVIS W CONTRAST  Result Date:  01/27/2023 CLINICAL DATA:  Abdominal pain, acute, nonlocalized.  Fall. EXAM: CT ABDOMEN AND PELVIS WITH CONTRAST TECHNIQUE: Multidetector CT imaging of the abdomen and pelvis was performed using the standard protocol following bolus administration of intravenous contrast. RADIATION DOSE REDUCTION: This exam was performed according to the departmental dose-optimization program which includes automated exposure control, adjustment of the mA and/or kV according to patient size and/or use of iterative reconstruction technique. CONTRAST:  OMNIPAQUE IOHEXOL 300 MG/ML  SOLN COMPARISON:  CT abdomen pelvis 04/18/2021 FINDINGS: Lower chest: No acute abnormality.  Coronary artery calcification. Hepatobiliary: Not enlarged. Couple subcentimeter hypodensities too small to characterize. No laceration or subcapsular hematoma. Status post cholecystectomy.  No biliary ductal dilatation. Pancreas: Normal pancreatic contour. No main pancreatic duct dilatation. Spleen: Not enlarged. No focal lesion. No laceration, subcapsular hematoma, or vascular injury. Adrenals/Urinary Tract: No nodularity bilaterally. Bilateral kidneys enhance symmetrically. No  hydronephrosis. No contusion, laceration, or subcapsular hematoma. Subcentimeter hypodensities too small to characterize-no further follow-up indicated. No injury to the vascular structures or collecting systems. No hydroureter. The urinary bladder is unremarkable. On delayed imaging, there is no urothelial wall thickening and there are no filling defects in the opacified portions of the bilateral collecting systems or ureters. Stomach/Bowel: No small or large bowel wall thickening or dilatation. Colonic diverticulosis. The appendix is not definitely identified with no inflammatory changes in the right lower quadrant to suggest acute appendicitis. Vasculature/Lymphatics: Moderate to severe atherosclerotic plaque. No abdominal aorta or iliac aneurysm. No active contrast extravasation or  pseudoaneurysm. No abdominal, pelvic, inguinal lymphadenopathy. Reproductive: Normal. Other: No simple free fluid ascites. No pneumoperitoneum. No hemoperitoneum. No mesenteric hematoma identified. No organized fluid collection. Musculoskeletal: No significant soft tissue hematoma. No acute pelvic fracture. No spinal fracture. Stable grade 1 anterolisthesis of L5 on S1. Diffusely decreased bone density. Degenerative changes of the left carpal bones. Partially visualized in the left upper extremity. Ports and Devices: None. IMPRESSION: 1. No acute intra-abdominal or intrapelvic traumatic injury. 2. No acute fracture or traumatic malalignment of the lumbar spine. 3. Diffusely decreased bone density. Electronically Signed   By: Tish Frederickson M.D.   On: 01/27/2023 00:17   CT HEAD WO CONTRAST ( )  Result Date: 01/27/2023 CLINICAL DATA:  Patient comes from home via EMS after fall outside after tripping over feet. Landed on left shoulder. EXAM: CT HEAD WITHOUT CONTRAST CT CERVICAL SPINE WITHOUT CONTRAST TECHNIQUE: Multidetector CT imaging of the head and cervical spine was performed following the standard protocol without intravenous contrast. Multiplanar CT image reconstructions of the cervical spine were also generated. RADIATION DOSE REDUCTION: This exam was performed according to the departmental dose-optimization program which includes automated exposure control, adjustment of the mA and/or kV according to patient size and/or use of iterative reconstruction technique. COMPARISON:  CT head 11/08/2022 and report from cervical spine radiographs 08/16/2022 FINDINGS: CT HEAD FINDINGS Brain: No intracranial hemorrhage, mass effect, or evidence of acute infarct. No hydrocephalus. No extra-axial fluid collection. Generalized cerebral atrophy. Ill-defined hypoattenuation within the cerebral white matter is nonspecific but consistent with chronic small vessel ischemic disease. Right basal ganglia calcification.  Vascular: No hyperdense vessel. Intracranial arterial calcification. Skull: No fracture or focal lesion. Sinuses/Orbits: No acute finding. Frothy secretions and air-fluid level in the left sphenoid sinus. Paranasal sinuses and mastoid air cells are otherwise well aerated. Other: None. CT CERVICAL SPINE FINDINGS Alignment: No evidence of traumatic malalignment. Skull base and vertebrae: No acute fracture. No primary bone lesion or focal pathologic process. Soft tissues and spinal canal: No prevertebral fluid or swelling. No visible canal hematoma. Disc levels: Multilevel spondylosis, disc space height loss, degenerative endplate change greatest at C6-C7 where it is advanced. Multilevel posterior disc osteophyte complexes cause mild effacement of the ventral thecal sac. Upper chest: Negative. Other: None. IMPRESSION: 1. No acute intracranial abnormality. Generalized atrophy and small vessel white matter disease. 2. No acute fracture in the cervical spine. Multilevel degenerative spondylosis. 3. Frothy secretions and air-fluid level in the left sphenoid sinus. Correlate for acute sinusitis. Electronically Signed   By: Minerva Fester M.D.   On: 01/27/2023 00:07   CT Cervical Spine Wo Contrast  Result Date: 01/27/2023 CLINICAL DATA:  Patient comes from home via EMS after fall outside after tripping over feet. Landed on left shoulder. EXAM: CT HEAD WITHOUT CONTRAST CT CERVICAL SPINE WITHOUT CONTRAST TECHNIQUE: Multidetector CT imaging of the head and cervical spine was performed following  the standard protocol without intravenous contrast. Multiplanar CT image reconstructions of the cervical spine were also generated. RADIATION DOSE REDUCTION: This exam was performed according to the departmental dose-optimization program which includes automated exposure control, adjustment of the mA and/or kV according to patient size and/or use of iterative reconstruction technique. COMPARISON:  CT head 11/08/2022 and report from  cervical spine radiographs 08/16/2022 FINDINGS: CT HEAD FINDINGS Brain: No intracranial hemorrhage, mass effect, or evidence of acute infarct. No hydrocephalus. No extra-axial fluid collection. Generalized cerebral atrophy. Ill-defined hypoattenuation within the cerebral white matter is nonspecific but consistent with chronic small vessel ischemic disease. Right basal ganglia calcification. Vascular: No hyperdense vessel. Intracranial arterial calcification. Skull: No fracture or focal lesion. Sinuses/Orbits: No acute finding. Frothy secretions and air-fluid level in the left sphenoid sinus. Paranasal sinuses and mastoid air cells are otherwise well aerated. Other: None. CT CERVICAL SPINE FINDINGS Alignment: No evidence of traumatic malalignment. Skull base and vertebrae: No acute fracture. No primary bone lesion or focal pathologic process. Soft tissues and spinal canal: No prevertebral fluid or swelling. No visible canal hematoma. Disc levels: Multilevel spondylosis, disc space height loss, degenerative endplate change greatest at C6-C7 where it is advanced. Multilevel posterior disc osteophyte complexes cause mild effacement of the ventral thecal sac. Upper chest: Negative. Other: None. IMPRESSION: 1. No acute intracranial abnormality. Generalized atrophy and small vessel white matter disease. 2. No acute fracture in the cervical spine. Multilevel degenerative spondylosis. 3. Frothy secretions and air-fluid level in the left sphenoid sinus. Correlate for acute sinusitis. Electronically Signed   By: Minerva Fester M.D.   On: 01/27/2023 00:07   DG Shoulder Left  Result Date: 01/26/2023 CLINICAL DATA:  Fall on shoulder EXAM: LEFT SHOULDER - 2+ VIEW COMPARISON:  None Available. FINDINGS: Transverse humeral neck fracture, nondisplaced. Additional humeral head fracture is suspected, making this a 3 part fracture, although this is not well visualized. No evidence of dislocation. The visualized soft tissues are  unremarkable. Visualized left lung is clear. IMPRESSION: Suspected three-part left proximal humerus fracture with dominant nondisplaced transverse humeral neck fracture. Electronically Signed   By: Charline Bills M.D.   On: 01/26/2023 20:28    Microbiology: Results for orders placed or performed during the hospital encounter of 01/26/23  Blood Culture (routine x 2)     Status: None   Collection Time: 01/27/23  2:34 AM   Specimen: BLOOD  Result Value Ref Range Status   Specimen Description BLOOD LEFT ANTECUBITAL  Final   Special Requests   Final    BOTTLES DRAWN AEROBIC AND ANAEROBIC Blood Culture adequate volume   Culture   Final    NO GROWTH 5 DAYS Performed at [facility], [address] [place], [place] [address]    Report Status 02/01/2023 FINAL  Final  Blood Culture (routine x 2)     Status: None   Collection Time: 01/27/23  2:34 AM   Specimen: BLOOD  Result Value Ref Range Status   Specimen Description BLOOD RIGHT ANTECUBITAL  Final   Special Requests   Final    BOTTLES DRAWN AEROBIC AND ANAEROBIC Blood Culture adequate volume   Culture   Final    NO GROWTH 5 DAYS Performed at [facility], [address], [place], [place] [address]    Report Status 02/01/2023 FINAL  Final  Resp panel by RT-PCR (RSV, Flu A&B, Covid) Anterior Nasal Swab     Status: None   Collection Time: 01/27/23  5:56 AM   Specimen: Anterior  Nasal Swab  Result Value Ref Range Status   SARS Coronavirus 2 by RT PCR NEGATIVE NEGATIVE Final    Comment: (NOTE) SARS-CoV-2 target nucleic acids are NOT DETECTED.  The SARS-CoV-2 RNA is generally detectable in upper respiratory specimens during the acute phase of infection. The lowest concentration of SARS-CoV-2 viral copies this assay can detect is 138 copies/mL. A negative result does not preclude SARS-Cov-2 infection and should not be used as the sole basis for treatment or other patient management decisions. A negative result may  occur with  improper specimen collection/handling, submission of specimen other than nasopharyngeal swab, presence of viral mutation(s) within the areas targeted by this assay, and inadequate number of viral copies(<138 copies/mL). A negative result must be combined with clinical observations, patient history, and epidemiological information. The expected result is Negative.  Fact Sheet for Patients:  BloggerCourse.com  Fact Sheet for Healthcare Providers:  SeriousBroker.it  This test is no t yet approved or cleared by the [place] FDA and  has been authorized for detection and/or diagnosis of SARS-CoV-2 by FDA under an Emergency Use Authorization (EUA). This EUA will remain  in effect (meaning this test can be used) for the duration of the COVID-19 declaration under Section 564(b)(1) of the Act, 21 U.S.C.section 360bbb-3(b)(1), unless the authorization is terminated  or revoked sooner.       Influenza A by PCR NEGATIVE NEGATIVE Final   Influenza B by PCR NEGATIVE NEGATIVE Final    Comment: (NOTE) The Xpert Xpress SARS-CoV-2/FLU/RSV plus assay is intended as an aid in the diagnosis of influenza from Nasopharyngeal swab specimens and should not be used as a sole basis for treatment. Nasal washings and aspirates are unacceptable for Xpert Xpress SARS-CoV-2/FLU/RSV testing.  Fact Sheet for Patients: BloggerCourse.com  Fact Sheet for Healthcare Providers: SeriousBroker.it  This test is not yet approved or cleared by the [place] FDA and has been authorized for detection and/or diagnosis of SARS-CoV-2 by FDA under an Emergency Use Authorization (EUA). This EUA will remain in effect (meaning this test can be used) for the duration of the COVID-19 declaration under Section 564(b)(1) of the Act, 21 U.S.C. section 360bbb-3(b)(1), unless the authorization is terminated  or revoked.     Resp Syncytial Virus by PCR NEGATIVE NEGATIVE Final    Comment: (NOTE) Fact Sheet for Patients: BloggerCourse.com  Fact Sheet for Healthcare Providers: SeriousBroker.it  This test is not yet approved or cleared by the [place] FDA and has been authorized for detection and/or diagnosis of SARS-CoV-2 by FDA under an Emergency Use Authorization (EUA). This EUA will remain in effect (meaning this test can be used) for the duration of the COVID-19 declaration under Section 564(b)(1) of the Act, 21 U.S.C. section 360bbb-3(b)(1), unless the authorization is terminated or revoked.  Performed at [facility], [address] [place], [place] [address]     Labs: CBC: No results for input(s): "WBC", "NEUTROABS", "HGB", "HCT", "MCV", "PLT" in the last 168 hours. Basic Metabolic Panel: No results for input(s): "NA", "K", "CL", "CO2", "GLUCOSE", "BUN", "CREATININE", "CALCIUM", "MG", "PHOS" in the last 168 hours. Liver Function Tests: No results for input(s): "AST", "ALT", "ALKPHOS", "BILITOT", "PROT", "ALBUMIN" in the last 168 hours. CBG: No results for input(s): "GLUCAP" in the last 168 hours.  Discharge time spent: greater than 30 minutes.  Signed: Delfino Lovett, MD [facility] 02/14/2023

## 2023-02-15 DIAGNOSIS — E039 Hypothyroidism, unspecified: Secondary | ICD-10-CM | POA: Diagnosis not present

## 2023-02-15 DIAGNOSIS — K219 Gastro-esophageal reflux disease without esophagitis: Secondary | ICD-10-CM | POA: Diagnosis not present

## 2023-02-15 DIAGNOSIS — M47819 Spondylosis without myelopathy or radiculopathy, site unspecified: Secondary | ICD-10-CM | POA: Diagnosis not present

## 2023-02-15 DIAGNOSIS — I10 Essential (primary) hypertension: Secondary | ICD-10-CM | POA: Diagnosis not present

## 2023-02-15 DIAGNOSIS — S42292D Other displaced fracture of upper end of left humerus, subsequent encounter for fracture with routine healing: Secondary | ICD-10-CM | POA: Diagnosis not present

## 2023-02-15 DIAGNOSIS — J40 Bronchitis, not specified as acute or chronic: Secondary | ICD-10-CM | POA: Diagnosis not present

## 2023-02-15 DIAGNOSIS — R002 Palpitations: Secondary | ICD-10-CM | POA: Diagnosis not present

## 2023-02-15 DIAGNOSIS — I499 Cardiac arrhythmia, unspecified: Secondary | ICD-10-CM | POA: Diagnosis not present

## 2023-02-15 DIAGNOSIS — R197 Diarrhea, unspecified: Secondary | ICD-10-CM | POA: Diagnosis not present

## 2023-02-18 DIAGNOSIS — S42292D Other displaced fracture of upper end of left humerus, subsequent encounter for fracture with routine healing: Secondary | ICD-10-CM | POA: Diagnosis not present

## 2023-02-18 DIAGNOSIS — E039 Hypothyroidism, unspecified: Secondary | ICD-10-CM | POA: Diagnosis not present

## 2023-02-18 DIAGNOSIS — M47819 Spondylosis without myelopathy or radiculopathy, site unspecified: Secondary | ICD-10-CM | POA: Diagnosis not present

## 2023-02-18 DIAGNOSIS — R002 Palpitations: Secondary | ICD-10-CM | POA: Diagnosis not present

## 2023-02-18 DIAGNOSIS — I499 Cardiac arrhythmia, unspecified: Secondary | ICD-10-CM | POA: Diagnosis not present

## 2023-02-18 DIAGNOSIS — I10 Essential (primary) hypertension: Secondary | ICD-10-CM | POA: Diagnosis not present

## 2023-02-18 DIAGNOSIS — R197 Diarrhea, unspecified: Secondary | ICD-10-CM | POA: Diagnosis not present

## 2023-02-18 DIAGNOSIS — J40 Bronchitis, not specified as acute or chronic: Secondary | ICD-10-CM | POA: Diagnosis not present

## 2023-02-18 DIAGNOSIS — K219 Gastro-esophageal reflux disease without esophagitis: Secondary | ICD-10-CM | POA: Diagnosis not present

## 2023-02-19 DIAGNOSIS — J40 Bronchitis, not specified as acute or chronic: Secondary | ICD-10-CM | POA: Diagnosis not present

## 2023-02-19 DIAGNOSIS — S42292D Other displaced fracture of upper end of left humerus, subsequent encounter for fracture with routine healing: Secondary | ICD-10-CM | POA: Diagnosis not present

## 2023-02-19 DIAGNOSIS — M47819 Spondylosis without myelopathy or radiculopathy, site unspecified: Secondary | ICD-10-CM | POA: Diagnosis not present

## 2023-02-19 DIAGNOSIS — S42202D Unspecified fracture of upper end of left humerus, subsequent encounter for fracture with routine healing: Secondary | ICD-10-CM | POA: Diagnosis not present

## 2023-02-19 DIAGNOSIS — R197 Diarrhea, unspecified: Secondary | ICD-10-CM | POA: Diagnosis not present

## 2023-02-19 DIAGNOSIS — K219 Gastro-esophageal reflux disease without esophagitis: Secondary | ICD-10-CM | POA: Diagnosis not present

## 2023-02-19 DIAGNOSIS — R002 Palpitations: Secondary | ICD-10-CM | POA: Diagnosis not present

## 2023-02-19 DIAGNOSIS — I499 Cardiac arrhythmia, unspecified: Secondary | ICD-10-CM | POA: Diagnosis not present

## 2023-02-19 DIAGNOSIS — E039 Hypothyroidism, unspecified: Secondary | ICD-10-CM | POA: Diagnosis not present

## 2023-02-19 DIAGNOSIS — I10 Essential (primary) hypertension: Secondary | ICD-10-CM | POA: Diagnosis not present

## 2023-02-20 DIAGNOSIS — K219 Gastro-esophageal reflux disease without esophagitis: Secondary | ICD-10-CM | POA: Diagnosis not present

## 2023-02-20 DIAGNOSIS — I499 Cardiac arrhythmia, unspecified: Secondary | ICD-10-CM | POA: Diagnosis not present

## 2023-02-20 DIAGNOSIS — R197 Diarrhea, unspecified: Secondary | ICD-10-CM | POA: Diagnosis not present

## 2023-02-20 DIAGNOSIS — M47819 Spondylosis without myelopathy or radiculopathy, site unspecified: Secondary | ICD-10-CM | POA: Diagnosis not present

## 2023-02-20 DIAGNOSIS — S42292D Other displaced fracture of upper end of left humerus, subsequent encounter for fracture with routine healing: Secondary | ICD-10-CM | POA: Diagnosis not present

## 2023-02-20 DIAGNOSIS — R002 Palpitations: Secondary | ICD-10-CM | POA: Diagnosis not present

## 2023-02-20 DIAGNOSIS — E039 Hypothyroidism, unspecified: Secondary | ICD-10-CM | POA: Diagnosis not present

## 2023-02-20 DIAGNOSIS — I10 Essential (primary) hypertension: Secondary | ICD-10-CM | POA: Diagnosis not present

## 2023-02-20 DIAGNOSIS — J40 Bronchitis, not specified as acute or chronic: Secondary | ICD-10-CM | POA: Diagnosis not present

## 2023-02-21 ENCOUNTER — Ambulatory Visit
Admission: RE | Admit: 2023-02-21 | Discharge: 2023-02-21 | Disposition: A | Discharge status: Home / Self Care | Payer: Medicare HMO | Source: Ambulatory Visit | Attending: Otolaryngology | Admitting: Otolaryngology

## 2023-02-21 DIAGNOSIS — J40 Bronchitis, not specified as acute or chronic: Secondary | ICD-10-CM | POA: Diagnosis not present

## 2023-02-21 DIAGNOSIS — R413 Other amnesia: Secondary | ICD-10-CM

## 2023-02-21 DIAGNOSIS — I499 Cardiac arrhythmia, unspecified: Secondary | ICD-10-CM | POA: Diagnosis not present

## 2023-02-21 DIAGNOSIS — R197 Diarrhea, unspecified: Secondary | ICD-10-CM | POA: Diagnosis not present

## 2023-02-21 DIAGNOSIS — I10 Essential (primary) hypertension: Secondary | ICD-10-CM | POA: Diagnosis not present

## 2023-02-21 DIAGNOSIS — E039 Hypothyroidism, unspecified: Secondary | ICD-10-CM | POA: Diagnosis not present

## 2023-02-21 DIAGNOSIS — R002 Palpitations: Secondary | ICD-10-CM | POA: Diagnosis not present

## 2023-02-21 DIAGNOSIS — S42292D Other displaced fracture of upper end of left humerus, subsequent encounter for fracture with routine healing: Secondary | ICD-10-CM | POA: Diagnosis not present

## 2023-02-21 DIAGNOSIS — K219 Gastro-esophageal reflux disease without esophagitis: Secondary | ICD-10-CM | POA: Diagnosis not present

## 2023-02-21 DIAGNOSIS — M47819 Spondylosis without myelopathy or radiculopathy, site unspecified: Secondary | ICD-10-CM | POA: Diagnosis not present

## 2023-02-21 DIAGNOSIS — R410 Disorientation, unspecified: Secondary | ICD-10-CM

## 2023-02-21 MED ORDER — GADOPICLENOL 0.5 MMOL/ML IV SOLN
7.5000 mL | Freq: Once | INTRAVENOUS | Status: AC | PRN
  Administered 2023-02-21: 7.5 mL via INTRAVENOUS

## 2023-02-22 DIAGNOSIS — K219 Gastro-esophageal reflux disease without esophagitis: Secondary | ICD-10-CM | POA: Diagnosis not present

## 2023-02-22 DIAGNOSIS — J40 Bronchitis, not specified as acute or chronic: Secondary | ICD-10-CM | POA: Diagnosis not present

## 2023-02-22 DIAGNOSIS — R197 Diarrhea, unspecified: Secondary | ICD-10-CM | POA: Diagnosis not present

## 2023-02-22 DIAGNOSIS — I10 Essential (primary) hypertension: Secondary | ICD-10-CM | POA: Diagnosis not present

## 2023-02-22 DIAGNOSIS — M47819 Spondylosis without myelopathy or radiculopathy, site unspecified: Secondary | ICD-10-CM | POA: Diagnosis not present

## 2023-02-22 DIAGNOSIS — E039 Hypothyroidism, unspecified: Secondary | ICD-10-CM | POA: Diagnosis not present

## 2023-02-22 DIAGNOSIS — R002 Palpitations: Secondary | ICD-10-CM | POA: Diagnosis not present

## 2023-02-22 DIAGNOSIS — S42292D Other displaced fracture of upper end of left humerus, subsequent encounter for fracture with routine healing: Secondary | ICD-10-CM | POA: Diagnosis not present

## 2023-02-22 DIAGNOSIS — I499 Cardiac arrhythmia, unspecified: Secondary | ICD-10-CM | POA: Diagnosis not present

## 2023-02-25 DIAGNOSIS — H8111 Benign paroxysmal vertigo, right ear: Secondary | ICD-10-CM | POA: Diagnosis not present

## 2023-02-26 DIAGNOSIS — R002 Palpitations: Secondary | ICD-10-CM | POA: Diagnosis not present

## 2023-02-26 DIAGNOSIS — S42292D Other displaced fracture of upper end of left humerus, subsequent encounter for fracture with routine healing: Secondary | ICD-10-CM | POA: Diagnosis not present

## 2023-02-26 DIAGNOSIS — J40 Bronchitis, not specified as acute or chronic: Secondary | ICD-10-CM | POA: Diagnosis not present

## 2023-02-26 DIAGNOSIS — I10 Essential (primary) hypertension: Secondary | ICD-10-CM | POA: Diagnosis not present

## 2023-02-26 DIAGNOSIS — M47819 Spondylosis without myelopathy or radiculopathy, site unspecified: Secondary | ICD-10-CM | POA: Diagnosis not present

## 2023-02-26 DIAGNOSIS — E039 Hypothyroidism, unspecified: Secondary | ICD-10-CM | POA: Diagnosis not present

## 2023-02-26 DIAGNOSIS — R197 Diarrhea, unspecified: Secondary | ICD-10-CM | POA: Diagnosis not present

## 2023-02-26 DIAGNOSIS — I499 Cardiac arrhythmia, unspecified: Secondary | ICD-10-CM | POA: Diagnosis not present

## 2023-02-26 DIAGNOSIS — K219 Gastro-esophageal reflux disease without esophagitis: Secondary | ICD-10-CM | POA: Diagnosis not present

## 2023-02-27 DIAGNOSIS — K219 Gastro-esophageal reflux disease without esophagitis: Secondary | ICD-10-CM | POA: Diagnosis not present

## 2023-02-27 DIAGNOSIS — S42292D Other displaced fracture of upper end of left humerus, subsequent encounter for fracture with routine healing: Secondary | ICD-10-CM | POA: Diagnosis not present

## 2023-02-27 DIAGNOSIS — R197 Diarrhea, unspecified: Secondary | ICD-10-CM | POA: Diagnosis not present

## 2023-02-27 DIAGNOSIS — J40 Bronchitis, not specified as acute or chronic: Secondary | ICD-10-CM | POA: Diagnosis not present

## 2023-02-27 DIAGNOSIS — R002 Palpitations: Secondary | ICD-10-CM | POA: Diagnosis not present

## 2023-02-27 DIAGNOSIS — I499 Cardiac arrhythmia, unspecified: Secondary | ICD-10-CM | POA: Diagnosis not present

## 2023-02-27 DIAGNOSIS — M47819 Spondylosis without myelopathy or radiculopathy, site unspecified: Secondary | ICD-10-CM | POA: Diagnosis not present

## 2023-02-27 DIAGNOSIS — E039 Hypothyroidism, unspecified: Secondary | ICD-10-CM | POA: Diagnosis not present

## 2023-02-27 DIAGNOSIS — I10 Essential (primary) hypertension: Secondary | ICD-10-CM | POA: Diagnosis not present

## 2023-02-28 DIAGNOSIS — Z114 Encounter for screening for human immunodeficiency virus [HIV]: Secondary | ICD-10-CM | POA: Diagnosis not present

## 2023-02-28 DIAGNOSIS — I499 Cardiac arrhythmia, unspecified: Secondary | ICD-10-CM | POA: Diagnosis not present

## 2023-02-28 DIAGNOSIS — F03918 Unspecified dementia, unspecified severity, with other behavioral disturbance: Secondary | ICD-10-CM | POA: Diagnosis not present

## 2023-02-28 DIAGNOSIS — J40 Bronchitis, not specified as acute or chronic: Secondary | ICD-10-CM | POA: Diagnosis not present

## 2023-02-28 DIAGNOSIS — Z118 Encounter for screening for other infectious and parasitic diseases: Secondary | ICD-10-CM | POA: Diagnosis not present

## 2023-02-28 DIAGNOSIS — M47819 Spondylosis without myelopathy or radiculopathy, site unspecified: Secondary | ICD-10-CM | POA: Diagnosis not present

## 2023-02-28 DIAGNOSIS — R197 Diarrhea, unspecified: Secondary | ICD-10-CM | POA: Diagnosis not present

## 2023-02-28 DIAGNOSIS — R002 Palpitations: Secondary | ICD-10-CM | POA: Diagnosis not present

## 2023-02-28 DIAGNOSIS — K219 Gastro-esophageal reflux disease without esophagitis: Secondary | ICD-10-CM | POA: Diagnosis not present

## 2023-02-28 DIAGNOSIS — I10 Essential (primary) hypertension: Secondary | ICD-10-CM | POA: Diagnosis not present

## 2023-02-28 DIAGNOSIS — E039 Hypothyroidism, unspecified: Secondary | ICD-10-CM | POA: Diagnosis not present

## 2023-02-28 DIAGNOSIS — S42292D Other displaced fracture of upper end of left humerus, subsequent encounter for fracture with routine healing: Secondary | ICD-10-CM | POA: Diagnosis not present

## 2023-03-01 DIAGNOSIS — R002 Palpitations: Secondary | ICD-10-CM | POA: Diagnosis not present

## 2023-03-01 DIAGNOSIS — E039 Hypothyroidism, unspecified: Secondary | ICD-10-CM | POA: Diagnosis not present

## 2023-03-01 DIAGNOSIS — K219 Gastro-esophageal reflux disease without esophagitis: Secondary | ICD-10-CM | POA: Diagnosis not present

## 2023-03-01 DIAGNOSIS — S42292D Other displaced fracture of upper end of left humerus, subsequent encounter for fracture with routine healing: Secondary | ICD-10-CM | POA: Diagnosis not present

## 2023-03-01 DIAGNOSIS — J40 Bronchitis, not specified as acute or chronic: Secondary | ICD-10-CM | POA: Diagnosis not present

## 2023-03-01 DIAGNOSIS — I499 Cardiac arrhythmia, unspecified: Secondary | ICD-10-CM | POA: Diagnosis not present

## 2023-03-01 DIAGNOSIS — M47819 Spondylosis without myelopathy or radiculopathy, site unspecified: Secondary | ICD-10-CM | POA: Diagnosis not present

## 2023-03-01 DIAGNOSIS — I10 Essential (primary) hypertension: Secondary | ICD-10-CM | POA: Diagnosis not present

## 2023-03-01 DIAGNOSIS — R197 Diarrhea, unspecified: Secondary | ICD-10-CM | POA: Diagnosis not present

## 2023-03-04 DIAGNOSIS — K219 Gastro-esophageal reflux disease without esophagitis: Secondary | ICD-10-CM | POA: Diagnosis not present

## 2023-03-04 DIAGNOSIS — E039 Hypothyroidism, unspecified: Secondary | ICD-10-CM | POA: Diagnosis not present

## 2023-03-04 DIAGNOSIS — I499 Cardiac arrhythmia, unspecified: Secondary | ICD-10-CM | POA: Diagnosis not present

## 2023-03-04 DIAGNOSIS — R197 Diarrhea, unspecified: Secondary | ICD-10-CM | POA: Diagnosis not present

## 2023-03-04 DIAGNOSIS — J40 Bronchitis, not specified as acute or chronic: Secondary | ICD-10-CM | POA: Diagnosis not present

## 2023-03-04 DIAGNOSIS — I10 Essential (primary) hypertension: Secondary | ICD-10-CM | POA: Diagnosis not present

## 2023-03-04 DIAGNOSIS — M47819 Spondylosis without myelopathy or radiculopathy, site unspecified: Secondary | ICD-10-CM | POA: Diagnosis not present

## 2023-03-04 DIAGNOSIS — S42292D Other displaced fracture of upper end of left humerus, subsequent encounter for fracture with routine healing: Secondary | ICD-10-CM | POA: Diagnosis not present

## 2023-03-04 DIAGNOSIS — R002 Palpitations: Secondary | ICD-10-CM | POA: Diagnosis not present

## 2023-03-06 DIAGNOSIS — M47819 Spondylosis without myelopathy or radiculopathy, site unspecified: Secondary | ICD-10-CM | POA: Diagnosis not present

## 2023-03-06 DIAGNOSIS — I10 Essential (primary) hypertension: Secondary | ICD-10-CM | POA: Diagnosis not present

## 2023-03-06 DIAGNOSIS — Z961 Presence of intraocular lens: Secondary | ICD-10-CM | POA: Diagnosis not present

## 2023-03-06 DIAGNOSIS — S42292D Other displaced fracture of upper end of left humerus, subsequent encounter for fracture with routine healing: Secondary | ICD-10-CM | POA: Diagnosis not present

## 2023-03-06 DIAGNOSIS — H43813 Vitreous degeneration, bilateral: Secondary | ICD-10-CM | POA: Diagnosis not present

## 2023-03-06 DIAGNOSIS — R197 Diarrhea, unspecified: Secondary | ICD-10-CM | POA: Diagnosis not present

## 2023-03-06 DIAGNOSIS — I499 Cardiac arrhythmia, unspecified: Secondary | ICD-10-CM | POA: Diagnosis not present

## 2023-03-06 DIAGNOSIS — H35039 Hypertensive retinopathy, unspecified eye: Secondary | ICD-10-CM | POA: Diagnosis not present

## 2023-03-06 DIAGNOSIS — R002 Palpitations: Secondary | ICD-10-CM | POA: Diagnosis not present

## 2023-03-06 DIAGNOSIS — J40 Bronchitis, not specified as acute or chronic: Secondary | ICD-10-CM | POA: Diagnosis not present

## 2023-03-06 DIAGNOSIS — K219 Gastro-esophageal reflux disease without esophagitis: Secondary | ICD-10-CM | POA: Diagnosis not present

## 2023-03-06 DIAGNOSIS — E039 Hypothyroidism, unspecified: Secondary | ICD-10-CM | POA: Diagnosis not present

## 2023-03-07 DIAGNOSIS — I499 Cardiac arrhythmia, unspecified: Secondary | ICD-10-CM | POA: Diagnosis not present

## 2023-03-07 DIAGNOSIS — K219 Gastro-esophageal reflux disease without esophagitis: Secondary | ICD-10-CM | POA: Diagnosis not present

## 2023-03-07 DIAGNOSIS — I10 Essential (primary) hypertension: Secondary | ICD-10-CM | POA: Diagnosis not present

## 2023-03-07 DIAGNOSIS — S42292D Other displaced fracture of upper end of left humerus, subsequent encounter for fracture with routine healing: Secondary | ICD-10-CM | POA: Diagnosis not present

## 2023-03-07 DIAGNOSIS — R197 Diarrhea, unspecified: Secondary | ICD-10-CM | POA: Diagnosis not present

## 2023-03-07 DIAGNOSIS — M47819 Spondylosis without myelopathy or radiculopathy, site unspecified: Secondary | ICD-10-CM | POA: Diagnosis not present

## 2023-03-07 DIAGNOSIS — R002 Palpitations: Secondary | ICD-10-CM | POA: Diagnosis not present

## 2023-03-07 DIAGNOSIS — J40 Bronchitis, not specified as acute or chronic: Secondary | ICD-10-CM | POA: Diagnosis not present

## 2023-03-07 DIAGNOSIS — E039 Hypothyroidism, unspecified: Secondary | ICD-10-CM | POA: Diagnosis not present

## 2023-03-08 ENCOUNTER — Inpatient Hospital Stay: Payer: Medicare HMO

## 2023-03-08 ENCOUNTER — Emergency Department: Payer: Medicare HMO

## 2023-03-08 ENCOUNTER — Emergency Department: Payer: Medicare HMO | Admitting: Anesthesiology

## 2023-03-08 ENCOUNTER — Encounter
Admission: EM | Disposition: A | Discharge status: Home Health | Payer: Self-pay | Source: Home / Self Care | Attending: Internal Medicine

## 2023-03-08 ENCOUNTER — Inpatient Hospital Stay
Admission: EM | Admit: 2023-03-08 | Discharge: 2023-03-10 | DRG: 481 | Disposition: A | Discharge status: Home Health | Payer: Medicare HMO | Attending: Internal Medicine | Admitting: Internal Medicine

## 2023-03-08 ENCOUNTER — Other Ambulatory Visit: Payer: Self-pay

## 2023-03-08 DIAGNOSIS — E559 Vitamin D deficiency, unspecified: Secondary | ICD-10-CM | POA: Insufficient documentation

## 2023-03-08 DIAGNOSIS — D649 Anemia, unspecified: Secondary | ICD-10-CM | POA: Insufficient documentation

## 2023-03-08 DIAGNOSIS — S42202A Unspecified fracture of upper end of left humerus, initial encounter for closed fracture: Secondary | ICD-10-CM | POA: Diagnosis not present

## 2023-03-08 DIAGNOSIS — G2581 Restless legs syndrome: Secondary | ICD-10-CM | POA: Diagnosis present

## 2023-03-08 DIAGNOSIS — Z79890 Hormone replacement therapy (postmenopausal): Secondary | ICD-10-CM | POA: Diagnosis not present

## 2023-03-08 DIAGNOSIS — S72002S Fracture of unspecified part of neck of left femur, sequela: Secondary | ICD-10-CM

## 2023-03-08 DIAGNOSIS — I739 Peripheral vascular disease, unspecified: Secondary | ICD-10-CM | POA: Diagnosis present

## 2023-03-08 DIAGNOSIS — F03918 Unspecified dementia, unspecified severity, with other behavioral disturbance: Secondary | ICD-10-CM

## 2023-03-08 DIAGNOSIS — R296 Repeated falls: Secondary | ICD-10-CM | POA: Diagnosis present

## 2023-03-08 DIAGNOSIS — W19XXXA Unspecified fall, initial encounter: Secondary | ICD-10-CM

## 2023-03-08 DIAGNOSIS — R2689 Other abnormalities of gait and mobility: Secondary | ICD-10-CM | POA: Diagnosis present

## 2023-03-08 DIAGNOSIS — D6489 Other specified anemias: Secondary | ICD-10-CM | POA: Diagnosis not present

## 2023-03-08 DIAGNOSIS — R062 Wheezing: Secondary | ICD-10-CM | POA: Diagnosis not present

## 2023-03-08 DIAGNOSIS — S72012A Unspecified intracapsular fracture of left femur, initial encounter for closed fracture: Secondary | ICD-10-CM | POA: Diagnosis not present

## 2023-03-08 DIAGNOSIS — S72002A Fracture of unspecified part of neck of left femur, initial encounter for closed fracture: Secondary | ICD-10-CM | POA: Diagnosis not present

## 2023-03-08 DIAGNOSIS — S42292D Other displaced fracture of upper end of left humerus, subsequent encounter for fracture with routine healing: Secondary | ICD-10-CM | POA: Diagnosis not present

## 2023-03-08 DIAGNOSIS — I499 Cardiac arrhythmia, unspecified: Secondary | ICD-10-CM | POA: Diagnosis not present

## 2023-03-08 DIAGNOSIS — K219 Gastro-esophageal reflux disease without esophagitis: Secondary | ICD-10-CM

## 2023-03-08 DIAGNOSIS — S42292S Other displaced fracture of upper end of left humerus, sequela: Secondary | ICD-10-CM | POA: Diagnosis not present

## 2023-03-08 DIAGNOSIS — R197 Diarrhea, unspecified: Secondary | ICD-10-CM | POA: Diagnosis not present

## 2023-03-08 DIAGNOSIS — E038 Other specified hypothyroidism: Secondary | ICD-10-CM | POA: Diagnosis not present

## 2023-03-08 DIAGNOSIS — E785 Hyperlipidemia, unspecified: Secondary | ICD-10-CM | POA: Diagnosis present

## 2023-03-08 DIAGNOSIS — I471 Supraventricular tachycardia, unspecified: Secondary | ICD-10-CM | POA: Diagnosis not present

## 2023-03-08 DIAGNOSIS — R002 Palpitations: Secondary | ICD-10-CM | POA: Diagnosis not present

## 2023-03-08 DIAGNOSIS — S42292A Other displaced fracture of upper end of left humerus, initial encounter for closed fracture: Secondary | ICD-10-CM | POA: Diagnosis not present

## 2023-03-08 DIAGNOSIS — Z87891 Personal history of nicotine dependence: Secondary | ICD-10-CM | POA: Diagnosis not present

## 2023-03-08 DIAGNOSIS — I10 Essential (primary) hypertension: Secondary | ICD-10-CM | POA: Diagnosis not present

## 2023-03-08 DIAGNOSIS — M47819 Spondylosis without myelopathy or radiculopathy, site unspecified: Secondary | ICD-10-CM | POA: Diagnosis not present

## 2023-03-08 DIAGNOSIS — E039 Hypothyroidism, unspecified: Secondary | ICD-10-CM | POA: Diagnosis not present

## 2023-03-08 DIAGNOSIS — Y92009 Unspecified place in unspecified non-institutional (private) residence as the place of occurrence of the external cause: Secondary | ICD-10-CM

## 2023-03-08 DIAGNOSIS — M1990 Unspecified osteoarthritis, unspecified site: Secondary | ICD-10-CM | POA: Diagnosis present

## 2023-03-08 DIAGNOSIS — S199XXA Unspecified injury of neck, initial encounter: Secondary | ICD-10-CM | POA: Diagnosis not present

## 2023-03-08 DIAGNOSIS — Z9181 History of falling: Secondary | ICD-10-CM | POA: Diagnosis not present

## 2023-03-08 DIAGNOSIS — Z90710 Acquired absence of both cervix and uterus: Secondary | ICD-10-CM

## 2023-03-08 DIAGNOSIS — Z888 Allergy status to other drugs, medicaments and biological substances status: Secondary | ICD-10-CM

## 2023-03-08 DIAGNOSIS — Z886 Allergy status to analgesic agent status: Secondary | ICD-10-CM

## 2023-03-08 DIAGNOSIS — S42212A Unspecified displaced fracture of surgical neck of left humerus, initial encounter for closed fracture: Secondary | ICD-10-CM | POA: Diagnosis present

## 2023-03-08 DIAGNOSIS — Z87442 Personal history of urinary calculi: Secondary | ICD-10-CM

## 2023-03-08 DIAGNOSIS — W010XXA Fall on same level from slipping, tripping and stumbling without subsequent striking against object, initial encounter: Secondary | ICD-10-CM | POA: Diagnosis present

## 2023-03-08 DIAGNOSIS — S0990XA Unspecified injury of head, initial encounter: Secondary | ICD-10-CM | POA: Diagnosis not present

## 2023-03-08 DIAGNOSIS — Z8781 Personal history of (healed) traumatic fracture: Secondary | ICD-10-CM | POA: Diagnosis not present

## 2023-03-08 DIAGNOSIS — Z1152 Encounter for screening for COVID-19: Secondary | ICD-10-CM

## 2023-03-08 DIAGNOSIS — Z79899 Other long term (current) drug therapy: Secondary | ICD-10-CM

## 2023-03-08 DIAGNOSIS — J40 Bronchitis, not specified as acute or chronic: Secondary | ICD-10-CM | POA: Diagnosis not present

## 2023-03-08 HISTORY — PX: COMPRESSION HIP SCREW: SHX1386

## 2023-03-08 LAB — CBC WITH DIFFERENTIAL/PLATELET
Abs Immature Granulocytes: 0.02 10*3/uL (ref 0.00–0.07)
Basophils Absolute: 0.1 10*3/uL (ref 0.0–0.1)
Basophils Relative: 1 %
Eosinophils Absolute: 0.3 10*3/uL (ref 0.0–0.5)
Eosinophils Relative: 3 %
HCT: 39.2 % (ref 36.0–46.0)
Hemoglobin: 12.2 g/dL (ref 12.0–15.0)
Immature Granulocytes: 0 %
Lymphocytes Relative: 21 %
Lymphs Abs: 1.7 10*3/uL (ref 0.7–4.0)
MCH: 24.7 pg — ABNORMAL LOW (ref 26.0–34.0)
MCHC: 31.1 g/dL (ref 30.0–36.0)
MCV: 79.5 fL — ABNORMAL LOW (ref 80.0–100.0)
Monocytes Absolute: 0.4 10*3/uL (ref 0.1–1.0)
Monocytes Relative: 6 %
Neutro Abs: 5.5 10*3/uL (ref 1.7–7.7)
Neutrophils Relative %: 69 %
Platelets: 249 10*3/uL (ref 150–400)
RBC: 4.93 MIL/uL (ref 3.87–5.11)
RDW: 14.6 % (ref 11.5–15.5)
WBC: 8 10*3/uL (ref 4.0–10.5)
nRBC: 0 % (ref 0.0–0.2)

## 2023-03-08 LAB — BASIC METABOLIC PANEL
Anion gap: 12 (ref 5–15)
BUN: 12 mg/dL (ref 8–23)
CO2: 22 mmol/L (ref 22–32)
Calcium: 9.6 mg/dL (ref 8.9–10.3)
Chloride: 101 mmol/L (ref 98–111)
Creatinine, Ser: 0.78 mg/dL (ref 0.44–1.00)
GFR, Estimated: >60 mL/min (ref >60)
Glucose, Bld: 98 mg/dL (ref 70–99)
Potassium: 3.8 mmol/L (ref 3.5–5.1)
Sodium: 135 mmol/L (ref 135–145)

## 2023-03-08 SURGERY — COMPRESSION HIP
Anesthesia: General | Site: Hip | Laterality: Left

## 2023-03-08 MED ORDER — SUGAMMADEX SODIUM 200 MG/2ML IV SOLN
INTRAVENOUS | Status: DC | PRN
  Administered 2023-03-08: 200 mg via INTRAVENOUS

## 2023-03-08 MED ORDER — FLEET ENEMA 7-19 GM/118ML RE ENEM: 1.0000 | ENEMA | Freq: Once | RECTAL | Status: DC | PRN

## 2023-03-08 MED ORDER — ACETAMINOPHEN 10 MG/ML IV SOLN
INTRAVENOUS | Status: AC
  Filled 2023-03-08: qty 100

## 2023-03-08 MED ORDER — PANTOPRAZOLE SODIUM 40 MG PO TBEC
40.0000 mg | DELAYED_RELEASE_TABLET | Freq: Every day | ORAL | Status: DC
  Administered 2023-03-09 – 2023-03-10 (×2): 40 mg via ORAL
  Filled 2023-03-08 (×2): qty 1

## 2023-03-08 MED ORDER — BUPIVACAINE HCL (PF) 0.25 % IJ SOLN
INTRAMUSCULAR | Status: AC
  Filled 2023-03-08: qty 30

## 2023-03-08 MED ORDER — ONDANSETRON HCL 4 MG/2ML IJ SOLN
INTRAMUSCULAR | Status: AC
  Filled 2023-03-08: qty 2

## 2023-03-08 MED ORDER — EPINEPHRINE PF 1 MG/ML IJ SOLN
INTRAMUSCULAR | Status: AC
  Filled 2023-03-08: qty 1

## 2023-03-08 MED ORDER — PRAVASTATIN SODIUM 20 MG PO TABS
40.0000 mg | ORAL_TABLET | Freq: Every day | ORAL | Status: DC
  Administered 2023-03-09 – 2023-03-10 (×2): 40 mg via ORAL
  Filled 2023-03-08 (×2): qty 2

## 2023-03-08 MED ORDER — DEXAMETHASONE SODIUM PHOSPHATE 10 MG/ML IJ SOLN
INTRAMUSCULAR | Status: DC | PRN
  Administered 2023-03-08: 10 mg via INTRAVENOUS

## 2023-03-08 MED ORDER — PROPOFOL 10 MG/ML IV BOLUS
INTRAVENOUS | Status: AC
  Filled 2023-03-08: qty 20

## 2023-03-08 MED ORDER — DULOXETINE HCL 30 MG PO CPEP
30.0000 mg | ORAL_CAPSULE | Freq: Every day | ORAL | Status: DC
  Administered 2023-03-09 – 2023-03-10 (×2): 30 mg via ORAL
  Filled 2023-03-08 (×2): qty 1

## 2023-03-08 MED ORDER — LIDOCAINE HCL (CARDIAC) PF 100 MG/5ML IV SOSY
PREFILLED_SYRINGE | INTRAVENOUS | Status: DC | PRN
  Administered 2023-03-08: 80 mg via INTRAVENOUS

## 2023-03-08 MED ORDER — LIDOCAINE HCL (PF) 2 % IJ SOLN
INTRAMUSCULAR | Status: AC
  Filled 2023-03-08: qty 5

## 2023-03-08 MED ORDER — OXYCODONE HCL 5 MG PO TABS: 5.0000 mg | ORAL_TABLET | Freq: Once | ORAL | Status: DC | PRN

## 2023-03-08 MED ORDER — OXYCODONE HCL 5 MG/5ML PO SOLN: 5.0000 mg | Freq: Once | ORAL | Status: DC | PRN

## 2023-03-08 MED ORDER — BUPIVACAINE-EPINEPHRINE (PF) 0.5% -1:200000 IJ SOLN
INTRAMUSCULAR | Status: DC | PRN
  Administered 2023-03-08: 30 mL

## 2023-03-08 MED ORDER — ONDANSETRON HCL 4 MG PO TABS
4.0000 mg | ORAL_TABLET | Freq: Four times a day (QID) | ORAL | Status: DC | PRN
  Administered 2023-03-09: 4 mg via ORAL
  Filled 2023-03-08: qty 1

## 2023-03-08 MED ORDER — ACETAMINOPHEN 500 MG PO TABS
500.0000 mg | ORAL_TABLET | Freq: Four times a day (QID) | ORAL | Status: AC
  Administered 2023-03-08 – 2023-03-09 (×4): 500 mg via ORAL
  Filled 2023-03-08 (×4): qty 1

## 2023-03-08 MED ORDER — ROCURONIUM BROMIDE 100 MG/10ML IV SOLN
INTRAVENOUS | Status: DC | PRN
  Administered 2023-03-08: 25 mg via INTRAVENOUS

## 2023-03-08 MED ORDER — ACETAMINOPHEN 10 MG/ML IV SOLN
INTRAVENOUS | Status: DC | PRN
  Administered 2023-03-08: 1000 mg via INTRAVENOUS

## 2023-03-08 MED ORDER — METOCLOPRAMIDE HCL 5 MG PO TABS: 5.0000 mg | ORAL_TABLET | Freq: Three times a day (TID) | ORAL | Status: DC | PRN

## 2023-03-08 MED ORDER — 0.9 % SODIUM CHLORIDE (POUR BTL) OPTIME
TOPICAL | Status: DC | PRN
  Administered 2023-03-08: 500 mL

## 2023-03-08 MED ORDER — HYDROCODONE-ACETAMINOPHEN 5-325 MG PO TABS
1.0000 | ORAL_TABLET | ORAL | Status: DC | PRN
Start: 2023-03-08 — End: 2023-03-08

## 2023-03-08 MED ORDER — ONDANSETRON HCL 4 MG/2ML IJ SOLN: 4.0000 mg | Freq: Once | INTRAMUSCULAR | Status: DC | PRN

## 2023-03-08 MED ORDER — MAGNESIUM HYDROXIDE 400 MG/5ML PO SUSP: 30.0000 mL | Freq: Every day | ORAL | Status: DC | PRN

## 2023-03-08 MED ORDER — LACTATED RINGERS IV SOLN: INTRAVENOUS | Status: DC

## 2023-03-08 MED ORDER — FENTANYL CITRATE (PF) 100 MCG/2ML IJ SOLN
INTRAMUSCULAR | Status: AC
  Filled 2023-03-08: qty 2

## 2023-03-08 MED ORDER — SODIUM CHLORIDE 0.9 % IV SOLN: INTRAVENOUS | Status: DC

## 2023-03-08 MED ORDER — DOCUSATE SODIUM 100 MG PO CAPS
100.0000 mg | ORAL_CAPSULE | Freq: Two times a day (BID) | ORAL | Status: DC
  Administered 2023-03-08 – 2023-03-10 (×4): 100 mg via ORAL
  Filled 2023-03-08 (×3): qty 1

## 2023-03-08 MED ORDER — PROPOFOL 10 MG/ML IV BOLUS
INTRAVENOUS | Status: DC | PRN
  Administered 2023-03-08: 60 mg via INTRAVENOUS
  Administered 2023-03-08: 140 mg via INTRAVENOUS

## 2023-03-08 MED ORDER — CEFAZOLIN SODIUM-DEXTROSE 2-4 GM/100ML-% IV SOLN
2.0000 g | INTRAVENOUS | Status: AC
  Administered 2023-03-08: 2 g via INTRAVENOUS
  Filled 2023-03-08: qty 100

## 2023-03-08 MED ORDER — SODIUM CHLORIDE FLUSH 0.9 % IV SOLN
INTRAVENOUS | Status: AC
  Filled 2023-03-08: qty 40

## 2023-03-08 MED ORDER — LEVOTHYROXINE SODIUM 50 MCG PO TABS
125.0000 ug | ORAL_TABLET | Freq: Every day | ORAL | Status: DC
  Administered 2023-03-09 – 2023-03-10 (×2): 125 ug via ORAL
  Filled 2023-03-08 (×2): qty 1

## 2023-03-08 MED ORDER — BISACODYL 10 MG RE SUPP: 10.0000 mg | Freq: Every day | RECTAL | Status: DC | PRN

## 2023-03-08 MED ORDER — DIPHENHYDRAMINE HCL 12.5 MG/5ML PO ELIX: 12.5000 mg | ORAL_SOLUTION | ORAL | Status: DC | PRN

## 2023-03-08 MED ORDER — CEFAZOLIN SODIUM-DEXTROSE 2-4 GM/100ML-% IV SOLN
2.0000 g | Freq: Four times a day (QID) | INTRAVENOUS | Status: AC
  Administered 2023-03-08 – 2023-03-09 (×3): 2 g via INTRAVENOUS
  Filled 2023-03-08 (×3): qty 100

## 2023-03-08 MED ORDER — ACETAMINOPHEN 325 MG PO TABS: 325.0000 mg | ORAL_TABLET | Freq: Four times a day (QID) | ORAL | Status: DC | PRN

## 2023-03-08 MED ORDER — KETAMINE HCL 50 MG/5ML IJ SOSY
PREFILLED_SYRINGE | INTRAMUSCULAR | Status: AC
  Filled 2023-03-08: qty 5

## 2023-03-08 MED ORDER — HYDROCODONE-ACETAMINOPHEN 5-325 MG PO TABS
1.0000 | ORAL_TABLET | Freq: Four times a day (QID) | ORAL | Status: DC | PRN
  Administered 2023-03-09 – 2023-03-10 (×2): 1 via ORAL
  Filled 2023-03-08 (×2): qty 1

## 2023-03-08 MED ORDER — EPHEDRINE 5 MG/ML INJ
INTRAVENOUS | Status: AC
  Filled 2023-03-08: qty 5

## 2023-03-08 MED ORDER — POLYETHYLENE GLYCOL 3350 17 G PO PACK: 17.0000 g | PACK | Freq: Every day | ORAL | Status: DC | PRN

## 2023-03-08 MED ORDER — BUPIVACAINE LIPOSOME 1.3 % IJ SUSP
INTRAMUSCULAR | Status: AC
  Filled 2023-03-08: qty 20

## 2023-03-08 MED ORDER — PHENYLEPHRINE 80 MCG/ML (10ML) SYRINGE FOR IV PUSH (FOR BLOOD PRESSURE SUPPORT)
PREFILLED_SYRINGE | INTRAVENOUS | Status: DC | PRN
  Administered 2023-03-08 (×4): 120 ug via INTRAVENOUS
  Administered 2023-03-08: 80 ug via INTRAVENOUS

## 2023-03-08 MED ORDER — DILTIAZEM HCL ER COATED BEADS 240 MG PO CP24
240.0000 mg | ORAL_CAPSULE | Freq: Every day | ORAL | Status: DC
  Administered 2023-03-09 – 2023-03-10 (×2): 240 mg via ORAL
  Filled 2023-03-08 (×2): qty 1

## 2023-03-08 MED ORDER — METOPROLOL TARTRATE 25 MG PO TABS
25.0000 mg | ORAL_TABLET | Freq: Two times a day (BID) | ORAL | Status: DC
  Administered 2023-03-08 – 2023-03-10 (×4): 25 mg via ORAL
  Filled 2023-03-08 (×4): qty 1

## 2023-03-08 MED ORDER — ENOXAPARIN SODIUM 40 MG/0.4ML IJ SOSY
40.0000 mg | PREFILLED_SYRINGE | INTRAMUSCULAR | Status: DC
  Administered 2023-03-09 – 2023-03-10 (×2): 40 mg via SUBCUTANEOUS
  Filled 2023-03-08 (×2): qty 0.4

## 2023-03-08 MED ORDER — KETAMINE HCL 10 MG/ML IJ SOLN
INTRAMUSCULAR | Status: DC | PRN
  Administered 2023-03-08: 20 mg via INTRAVENOUS
  Administered 2023-03-08: 30 mg via INTRAVENOUS

## 2023-03-08 MED ORDER — TRANEXAMIC ACID 1000 MG/10ML IV SOLN
INTRAVENOUS | Status: AC
  Filled 2023-03-08: qty 10

## 2023-03-08 MED ORDER — ACETAMINOPHEN 10 MG/ML IV SOLN: 1000.0000 mg | Freq: Once | INTRAVENOUS | Status: DC | PRN

## 2023-03-08 MED ORDER — ONDANSETRON HCL 4 MG/2ML IJ SOLN: 4.0000 mg | Freq: Four times a day (QID) | INTRAMUSCULAR | Status: DC | PRN

## 2023-03-08 MED ORDER — FENTANYL CITRATE (PF) 100 MCG/2ML IJ SOLN
INTRAMUSCULAR | Status: DC | PRN
  Administered 2023-03-08 (×2): 50 ug via INTRAVENOUS

## 2023-03-08 MED ORDER — ONDANSETRON HCL 4 MG/2ML IJ SOLN
INTRAMUSCULAR | Status: DC | PRN
  Administered 2023-03-08: 4 mg via INTRAVENOUS

## 2023-03-08 MED ORDER — FENTANYL CITRATE (PF) 100 MCG/2ML IJ SOLN
25.0000 ug | INTRAMUSCULAR | Status: DC | PRN
  Administered 2023-03-08 (×4): 25 ug via INTRAVENOUS

## 2023-03-08 MED ORDER — DOCUSATE SODIUM 100 MG PO CAPS
100.0000 mg | ORAL_CAPSULE | Freq: Two times a day (BID) | ORAL | Status: DC
  Filled 2023-03-08 (×4): qty 1

## 2023-03-08 MED ORDER — METOCLOPRAMIDE HCL 5 MG/ML IJ SOLN: 5.0000 mg | Freq: Three times a day (TID) | INTRAMUSCULAR | Status: DC | PRN

## 2023-03-08 MED ORDER — EPHEDRINE SULFATE (PRESSORS) 50 MG/ML IJ SOLN
INTRAMUSCULAR | Status: DC | PRN
  Administered 2023-03-08: 10 mg via INTRAVENOUS
  Administered 2023-03-08: 15 mg via INTRAVENOUS

## 2023-03-08 MED ORDER — PHENYLEPHRINE 80 MCG/ML (10ML) SYRINGE FOR IV PUSH (FOR BLOOD PRESSURE SUPPORT)
PREFILLED_SYRINGE | INTRAVENOUS | Status: AC
  Filled 2023-03-08: qty 10

## 2023-03-08 MED ORDER — DEXAMETHASONE SODIUM PHOSPHATE 10 MG/ML IJ SOLN
INTRAMUSCULAR | Status: AC
  Filled 2023-03-08: qty 1

## 2023-03-08 MED ORDER — MORPHINE SULFATE (PF) 2 MG/ML IV SOLN: 0.5000 mg | INTRAVENOUS | Status: DC | PRN

## 2023-03-08 MED ORDER — LOSARTAN POTASSIUM 50 MG PO TABS
100.0000 mg | ORAL_TABLET | Freq: Every day | ORAL | Status: DC
  Administered 2023-03-09 – 2023-03-10 (×2): 100 mg via ORAL
  Filled 2023-03-08 (×2): qty 2

## 2023-03-08 SURGICAL SUPPLY — 40 items
BIT DRILL 4.8X200 CANN (BIT) IMPLANT
CHLORAPREP W/TINT 26 (MISCELLANEOUS) ×2 IMPLANT
COVER BACK TABLE REUSABLE LG (DRAPES) ×1 IMPLANT
DRAPE 3/4 80X56 (DRAPES) ×1 IMPLANT
DRAPE C-ARMOR (DRAPES) ×1 IMPLANT
ELECT REM PT RETURN 9FT ADLT (ELECTROSURGICAL) ×1
ELECTRODE REM PT RTRN 9FT ADLT (ELECTROSURGICAL) ×1 IMPLANT
GAUZE SPONGE 4X4 12PLY STRL (GAUZE/BANDAGES/DRESSINGS) ×1 IMPLANT
GLOVE BIO SURGEON STRL SZ8 (GLOVE) ×2 IMPLANT
GLOVE INDICATOR 8.0 STRL GRN (GLOVE) ×1 IMPLANT
GOWN STRL REUS W/ TWL LRG LVL3 (GOWN DISPOSABLE) ×1 IMPLANT
GOWN STRL REUS W/ TWL XL LVL3 (GOWN DISPOSABLE) ×1 IMPLANT
GOWN STRL REUS W/TWL LRG LVL3 (GOWN DISPOSABLE) ×1
GOWN STRL REUS W/TWL XL LVL3 (GOWN DISPOSABLE) ×1
IMMOBILIZER SHDR LG LX 900803 (SOFTGOODS) IMPLANT
MANIFOLD NEPTUNE II (INSTRUMENTS) ×1 IMPLANT
MAT ABSORB  FLUID 56X50 GRAY (MISCELLANEOUS) ×1
MAT ABSORB FLUID 56X50 GRAY (MISCELLANEOUS) ×1 IMPLANT
NDL SPNL 22GX1.5 QUINCKE BK (NEEDLE) IMPLANT
NEEDLE SPNL 22GX1.5 QUINCKE BK (NEEDLE) ×1 IMPLANT
NS IRRIG 1000ML POUR BTL (IV SOLUTION) ×1 IMPLANT
PACK HIP COMPR (MISCELLANEOUS) ×1 IMPLANT
PIN GUIDE DRILL TIP 2.8X300 (DRILL) IMPLANT
REAMER ROD DEEP FLUTE 2.5X950 (INSTRUMENTS) ×1 IMPLANT
SCREW 8.0X80MMX16 (Screw) IMPLANT
SCREW CANN 8.0X85 HIP (Screw) IMPLANT
SCREW CANNULATED 8.0X75MM (Screw) IMPLANT
STAPLER SKIN PROX 35W (STAPLE) ×1 IMPLANT
STRAP SAFETY 5IN WIDE (MISCELLANEOUS) ×1 IMPLANT
SUCTION FRAZIER HANDLE 10FR (MISCELLANEOUS) ×1
SUCTION TUBE FRAZIER 10FR DISP (MISCELLANEOUS) ×1 IMPLANT
SUT VIC AB 0 CT1 36 (SUTURE) ×1 IMPLANT
SUT VIC AB 1 CT1 36 (SUTURE) ×1 IMPLANT
SUT VIC AB 2-0 CT1 27 (SUTURE) ×1
SUT VIC AB 2-0 CT1 TAPERPNT 27 (SUTURE) ×1 IMPLANT
SYR 20ML LL LF (SYRINGE) IMPLANT
TAPE MICROFOAM 4IN (TAPE) ×1 IMPLANT
TRAP FLUID SMOKE EVACUATOR (MISCELLANEOUS) ×1 IMPLANT
WASHER FLAT 6.5MM (Washer) IMPLANT
WATER STERILE IRR 500ML POUR (IV SOLUTION) ×1 IMPLANT

## 2023-03-08 NOTE — ED Provider Notes (Signed)
[facility] Emergency Department Provider Note     Event Date/Time   First MD Initiated Contact with Patient 03/08/23 1415     (approximate)   History   Fall   HPI  Charlene Thomas is a 77 y.o. female history of HTN, GERD, and for thyroidism, who presents to the ED for evaluation of injury sustained from mechanical fall last night.  Patient is 3 weeks status post a nonsurgical left proximal humeral fracture.  She bent over to pick up something last night, tipping over, landed on her left hip and arm.  She does endorse head injury but denies any LOC,, vomiting, dizziness patient presents to the ED today companied by her adult son, for evaluation of ongoing left hip pain.   Physical Exam   Triage Vital Signs: ED Triage Vitals  Enc Vitals Group     BP 03/08/23 1321 (!) 140/94     Pulse Rate 03/08/23 1321 88     Resp 03/08/23 1321 16     Temp 03/08/23 1321 98.5 F (36.9 C)     Temp Source 03/08/23 1321 Oral     SpO2 03/08/23 1321 91 %     Weight 03/08/23 1322 160 lb (72.6 kg)     Height 03/08/23 1322 5\' 2"  (1.575 m)     Head Circumference --      Peak Flow --      Pain Score 03/08/23 1322 6     Pain Loc --      Pain Edu? --      Excl. in GC? --     Most recent vital signs: Vitals:   03/08/23 1321 03/08/23 1535  BP: (!) 140/94 110/74  Pulse: 88 77  Resp: 16 16  Temp: 98.5 F (36.9 C) 98 F (36.7 C)  SpO2: 91% 94%    General Awake, no distress. NAD HEENT NCAT. PERRL. EOMI. No rhinorrhea. Mucous membranes are moist.  CV:  Good peripheral perfusion. RRR RESP:  Normal effort. CTA ABD:  No distention.  MSK:  Left shoulder without obvious deformity, dislocation, or sulcus sign.  Patient with limited range of motion secondary to a proximal humeral neck fracture.  Normal composite fist distally.  Left hip and pelvis without obvious deformity.  External rotation noted to the left lower extremity. NEURO: Cranial nerves II to XII grossly  intact.   ED Results / Procedures / Treatments   Labs (all labs ordered are listed, but only abnormal results are displayed) Labs Reviewed  CBC WITH DIFFERENTIAL/PLATELET - Abnormal; Notable for the following components:      Result Value   MCV 79.5 (*)    MCH 24.7 (*)    All other components within normal limits  BASIC METABOLIC PANEL    EKG   RADIOLOGY  I personally viewed and evaluated these images as part of my medical decision making, as well as reviewing the written report by the radiologist.  ED Provider Interpretation: acute, comminuted left humeral neck fracture when compared to 01/26/24 films. Acute, impacted left femoral neck fracture  CT HEAD WO CONTRAST ( )  Result Date: 03/08/2023 CLINICAL DATA:  Trauma/fall EXAM: CT HEAD WITHOUT CONTRAST CT CERVICAL SPINE WITHOUT CONTRAST TECHNIQUE: Multidetector CT imaging of the head and cervical spine was performed following the standard protocol without intravenous contrast. Multiplanar CT image reconstructions of the cervical spine were also generated. RADIATION DOSE REDUCTION: This exam was performed according to the departmental dose-optimization program which includes automated exposure control, adjustment  of the mA and/or kV according to patient size and/or use of iterative reconstruction technique. COMPARISON:  MRI brain dated 02/21/2023. CT head/cervical spine dated 01/26/2023. FINDINGS: CT HEAD FINDINGS Brain: No evidence of acute infarction, hemorrhage, hydrocephalus, extra-axial collection or mass lesion/mass effect. Subcortical white matter and periventricular small vessel ischemic changes. Vascular: Intracranial atherosclerosis. Skull: Normal. Negative for fracture or focal lesion. Sinuses/Orbits: Partial opacification of the left mastoid air cells. Visualized paranasal sinuses otherwise clear. Other: None. CT CERVICAL SPINE FINDINGS Alignment: Normal cervical lordosis. Skull base and vertebrae: No acute fracture. No primary  bone lesion or focal pathologic process. Soft tissues and spinal canal: No prevertebral fluid or swelling. No visible canal hematoma. Disc levels: Mild degenerative changes of the mid cervical spine. Spinal canal is patent. Upper chest: Visualized lung apices are clear. Other: None. IMPRESSION: No acute intracranial abnormality. Small vessel ischemic changes. No traumatic injury to the cervical spine. Mild degenerative changes. Electronically Signed   By: Charline Bills M.D.   On: 03/08/2023 14:52   CT Cervical Spine Wo Contrast  Result Date: 03/08/2023 CLINICAL DATA:  Trauma/fall EXAM: CT HEAD WITHOUT CONTRAST CT CERVICAL SPINE WITHOUT CONTRAST TECHNIQUE: Multidetector CT imaging of the head and cervical spine was performed following the standard protocol without intravenous contrast. Multiplanar CT image reconstructions of the cervical spine were also generated. RADIATION DOSE REDUCTION: This exam was performed according to the departmental dose-optimization program which includes automated exposure control, adjustment of the mA and/or kV according to patient size and/or use of iterative reconstruction technique. COMPARISON:  MRI brain dated 02/21/2023. CT head/cervical spine dated 01/26/2023. FINDINGS: CT HEAD FINDINGS Brain: No evidence of acute infarction, hemorrhage, hydrocephalus, extra-axial collection or mass lesion/mass effect. Subcortical white matter and periventricular small vessel ischemic changes. Vascular: Intracranial atherosclerosis. Skull: Normal. Negative for fracture or focal lesion. Sinuses/Orbits: Partial opacification of the left mastoid air cells. Visualized paranasal sinuses otherwise clear. Other: None. CT CERVICAL SPINE FINDINGS Alignment: Normal cervical lordosis. Skull base and vertebrae: No acute fracture. No primary bone lesion or focal pathologic process. Soft tissues and spinal canal: No prevertebral fluid or swelling. No visible canal hematoma. Disc levels: Mild degenerative  changes of the mid cervical spine. Spinal canal is patent. Upper chest: Visualized lung apices are clear. Other: None. IMPRESSION: No acute intracranial abnormality. Small vessel ischemic changes. No traumatic injury to the cervical spine. Mild degenerative changes. Electronically Signed   By: Charline Bills M.D.   On: 03/08/2023 14:52   DG Shoulder Left  Result Date: 03/08/2023 CLINICAL DATA:  Trauma, fall EXAM: LEFT SHOULDER - 2+ VIEW COMPARISON:  01/26/2023 FINDINGS: There is comminuted impacted fracture in the neck of left humerus. There is 7 mm offset in alignment of cortical margins along the lateral aspect. This finding was not distinctly seen in the previous examination. Possible soft tissue calcifications are noted adjacent to the fracture site. There is no dislocation. Osteopenia is seen in bony structures. IMPRESSION: There is comminuted fracture in the neck of left humerus. There is offset in alignment of cortical margins and possible impaction at the fracture site. There is no dislocation. Electronically Signed   By: Ernie Avena M.D.   On: 03/08/2023 14:28   DG Hip Unilat W or Wo Pelvis 2-3 Views Left  Result Date: 03/08/2023 CLINICAL DATA:  Trauma, fall EXAM: DG HIP (WITH OR WITHOUT PELVIS) 2-3V LEFT COMPARISON:  None FINDINGS: There is a recent comminuted fracture in the neck of left femur. There is 4 mm offset in alignment of  the fracture fragments. There is shortening of left femoral neck suggesting impaction at the fracture site. Rest of the bony structures show no fractures. Degenerative changes are noted in the visualized lower lumbar spine. IMPRESSION: Recent comminuted impacted fracture is seen in the neck of left femur. Electronically Signed   By: Ernie Avena M.D.   On: 03/08/2023 14:22     PROCEDURES:  Critical Care performed: No  Procedures   MEDICATIONS ORDERED IN ED: Medications  ceFAZolin (ANCEF) IVPB 2g/100 mL premix (has no administration in time  range)     IMPRESSION / MDM / ASSESSMENT AND PLAN / ED COURSE  I reviewed the triage vital signs and the nursing notes.                              Differential diagnosis includes, but is not limited to, closed head injury, SDH, cervical fracture, shoulder fracture, shoulder dislocation, hip fracture, hip dislocation  Patient's presentation is most consistent with acute complicated illness / injury requiring diagnostic workup.  ----------------------------------------- 3:31 PM on 03/08/2023 ----------------------------------------- S/W Dr. Joice Lofts: he reviewed the history images and will proceed with surgical intervention for the acute left hip fracture.  He notes that the left humeral fracture is again nonsurgical diagnosis.   Patient's diagnosis is consistent with mechanical fall resulting in a acute on acute closed left proximal humeral neck fracture.  She is also found to have an acute left femoral neck fracture comminuted in nature.  CT images of the head and cervical spine are negative and reassuring at this have any acute findings.  Patient otherwise is stable at this time endorsing some moderate left hip and left shoulder pain.  Her son is at bedside, and both the patient and her son are aware of the acute findings and need for surgical intervention related to the left hip.  They are agreeable to the plan at this time.  Hospital service will be contacted for the admission.    FINAL CLINICAL IMPRESSION(S) / ED DIAGNOSES   Final diagnoses:  Fx humeral neck, left, closed, initial encounter  Closed fracture of left hip, initial encounter (HCC)  Minor head injury, initial encounter  Fall in home, initial encounter     Rx / DC Orders   ED Discharge Orders     None        Note:  This document was prepared using Dragon voice recognition software and may include unintentional dictation errors.    Lissa Hoard, PA-C 03/08/23 1635    Chesley Noon, MD 03/08/23  [facility]ell Moment

## 2023-03-08 NOTE — Anesthesia Postprocedure Evaluation (Signed)
Anesthesia Post Note  Patient: Charlene Thomas  Procedure(s) Performed: Cannulated Screw Fixation of the Left Femoral Neck Fracture (Left: Hip)  Patient location during evaluation: PACU Anesthesia Type: General Level of consciousness: awake and alert, oriented and patient cooperative Pain management: pain level controlled Vital Signs Assessment: post-procedure vital signs reviewed and stable Respiratory status: spontaneous breathing, nonlabored ventilation and respiratory function stable Cardiovascular status: blood pressure returned to baseline and stable Postop Assessment: adequate PO intake Anesthetic complications: no   No notable events documented.   Last Vitals:  Vitals:   03/08/23 1950 03/08/23 1955  BP: (!) 140/95 (!) 146/96  Pulse: 78 73  Resp: 10 10  Temp:    SpO2: 93% 97%    Last Pain:  Vitals:   03/08/23 1955  TempSrc:   PainSc: Asleep                 Reed Breech

## 2023-03-08 NOTE — Plan of Care (Signed)

## 2023-03-08 NOTE — Anesthesia Procedure Notes (Signed)
Procedure Name: LMA Insertion Date/Time: 03/08/2023 5:19 PM  Performed by: Katherine Basset, CRNAPre-anesthesia Checklist: Patient identified, Emergency Drugs available, Suction available and Patient being monitored Patient Re-evaluated:Patient Re-evaluated prior to induction Oxygen Delivery Method: Circle system utilized Preoxygenation: Pre-oxygenation with 100% oxygen Induction Type: IV induction LMA: LMA inserted LMA Size: 4.0 Number of attempts: 1 Placement Confirmation: positive ETCO2 and breath sounds checked- equal and bilateral Tube secured with: Tape Dental Injury: Teeth and Oropharynx as per pre-operative assessment  Comments: Igel size 4 utilized w/o difficulty

## 2023-03-08 NOTE — H&P (Addendum)
History and Physical    Charlene Thomas RUE:[id number] DOB: 1946/01/27 DOA: 03/08/2023  PCP: Marguarite Arbour, MD  Patient coming from: home  I have personally briefly reviewed patient's old medical records in [facility]  Chief Complaint: left hip left shoulder pain after   HPI: Charlene Thomas is a 77 y.o. female with medical history significant of GERD, Hypothyroidism,  HLD, HTN, SVT, gait instability with falls , RLS,  hx of Benign positional vertigo with nystagmus, tremor of left hand and left hemibody  bradykinesia who presents to ED s/p mechanical fall  after attempting to pick up item off floor.  Patient states she fell on her left side and noted left arm and hip pain s/p fall. She notes prior to fall she was in her normal state of health. She denies any chest pain , sob/ fever/chills/ cough / n/v/d/ or dysuria.  She notes she only has pain with movement in regards to her arm and left hip. She also has interim history of fall one month ago on her left were she sustained fx of left humeral neck.  ED Course:  IN ED: Vitals: Afeb, bp 140/94, HR 88, rr 16, sat 91 %   Labs reviewed and stable Hip xray IMPRESSION: Recent comminuted impacted fracture is seen in the neck of left femur.    Left shoulder xray  IMPRESSION: There is comminuted fracture in the neck of left humerus. There is offset in alignment of cortical margins and possible impaction at the fracture site. There is no dislocation.  Review of Systems: As per HPI otherwise 10 point review of systems negative.   Past Medical History:  Diagnosis Date   Anginal pain (HCC)    Arthritis    Dysrhythmia    TACHYCARDIA   GERD (gastroesophageal reflux disease)    Heart palpitations    Hypertension    Hypothyroidism     Past Surgical History:  Procedure Laterality Date   ABDOMINAL HYSTERECTOMY     CATARACT EXTRACTION W/PHACO Left 05/15/2016   Procedure: CATARACT EXTRACTION PHACO AND INTRAOCULAR LENS PLACEMENT  (IOC);  Surgeon: Galen Manila, MD;  Location: [facility] ORS;  Service: Ophthalmology;  Laterality: Left;  [place] 00:34 AP% 20.1 CDE 6.98 Fluid pack lot # [id number]   CATARACT EXTRACTION W/PHACO Right 06/12/2016   Procedure: CATARACT EXTRACTION PHACO AND INTRAOCULAR LENS PLACEMENT (IOC);  Surgeon: Galen Manila, MD;  Location: [facility] ORS;  Service: Ophthalmology;  Laterality: Right;  Lot# [id number] H [place]: 1:05.5 AP%:45.8 CDE:12.87   CESAREAN SECTION     X 3   CHOLECYSTECTOMY     GRAFTS     SKIN GRAFTS FOR BURNS   KIDNEY STONE SURGERY     THROAT SURGERY       reports that she has quit smoking. She has never used smokeless tobacco. She reports that she does not drink alcohol and does not use drugs.  Allergies  Allergen Reactions   Aspirin    Celecoxib Other (See Comments)    Gi upset   Ezetimibe-Simvastatin     Other Reaction(s): Muscle Pain   Mobic [Meloxicam] Itching   Raloxifene     Other Reaction(s): Unknown  Upset gi   Topiramate Other (See Comments)    Causes severe confusion    Family History  Problem Relation Age of Onset   Breast cancer Neg Hx     Prior to Admission medications   Medication Sig Start Date End Date Taking? Authorizing Provider  cetirizine (ZYRTEC) 10 MG tablet Take 10  mg by mouth daily.    [provider]  diltiazem (CARDIZEM CD) 240 MG 24 hr capsule Take 240 mg by mouth daily. 01/02/23   [provider]  DULoxetine (CYMBALTA) 30 MG capsule Take 30 mg by mouth daily.    [provider]  levothyroxine (SYNTHROID) 125 MCG tablet Take 125 mcg by mouth daily. 12/28/22   [provider]  losartan (COZAAR) 100 MG tablet Take 100 mg by mouth daily. 12/28/22   [provider]  metoprolol tartrate (LOPRESSOR) 25 MG tablet Take 25 mg by mouth 2 (two) times daily. 12/28/22   [provider]  omeprazole (PRILOSEC) 20 MG capsule Take 20 mg by mouth 2 (two) times daily. 02/19/16   [provider]  pravastatin  (PRAVACHOL) 40 MG tablet Take 1 tablet (40 mg total) by mouth daily. 01/28/23   Delfino Lovett, MD    Physical Exam: Vitals:   03/08/23 1321 03/08/23 1322 03/08/23 1535  BP: (!) 140/94  110/74  Pulse: 88  77  Resp: 16  16  Temp: 98.5 F (36.9 C)  98 F (36.7 C)  TempSrc: Oral  Oral  SpO2: 91%  94%  Weight:  72.6 kg   Height:  5\' 2"  (1.575 m)     Constitutional: NAD, calm, comfortable Vitals:   03/08/23 1321 03/08/23 1322 03/08/23 1535  BP: (!) 140/94  110/74  Pulse: 88  77  Resp: 16  16  Temp: 98.5 F (36.9 C)  98 F (36.7 C)  TempSrc: Oral  Oral  SpO2: 91%  94%  Weight:  72.6 kg   Height:  5\' 2"  (1.575 m)    Eyes: PERRL, lids and conjunctivae normal ENMT: Mucous membranes are moist. Posterior pharynx clear of any exudate or lesions. Neck: normal, supple, no masses, no thyromegaly Respiratory: clear to auscultation bilaterally, no wheezing, no crackles. Normal respiratory effort. No accessory muscle use.  Cardiovascular: Regular rate and rhythm, no murmurs / rubs / gallops. No extremity edema. 2+ pedal pulses.   Abdomen: no tenderness, no masses palpated. No hepatosplenomegaly. Bowel sounds positive.  Musculoskeletal: no clubbing / cyanosis. + left arm pain with rom and palpation, + left him pain with ROM , no contractures. Normal muscle tone.  Skin: no rashes, lesions, ulcers. No induration Neurologic: CN 2-12 grossly intact. Sensation intact, MAEx4 decrease on left side due to pain  Psychiatric: Normal judgment and insight. Alert and oriented x 3. Normal mood.    Labs on Admission: I have personally reviewed following labs and imaging studies  CBC: Recent Labs  Lab 03/08/23 1531  WBC 8.0  NEUTROABS 5.5  HGB 12.2  HCT 39.2  MCV 79.5*  PLT 249   Basic Metabolic Panel: Recent Labs  Lab 03/08/23 1531  NA 135  K 3.8  CL 101  CO2 22  GLUCOSE 98  BUN 12  CREATININE 0.78  CALCIUM 9.6   GFR: Estimated Creatinine Clearance: 53.2 mL/min (by C-G formula based  on SCr of 0.78 mg/dL). Liver Function Tests: No results for input(s): "AST", "ALT", "ALKPHOS", "BILITOT", "PROT", "ALBUMIN" in the last 168 hours. No results for input(s): "LIPASE", "AMYLASE" in the last 168 hours. No results for input(s): "AMMONIA" in the last 168 hours. Coagulation Profile: No results for input(s): "INR", "PROTIME" in the last 168 hours. Cardiac Enzymes: No results for input(s): "CKTOTAL", "CKMB", "CKMBINDEX", "TROPONINI" in the last 168 hours. BNP (last 3 results) No results for input(s): "PROBNP" in the last 8760 hours. HbA1C: No results for input(s): "HGBA1C"  in the last 72 hours. CBG: No results for input(s): "GLUCAP" in the last 168 hours. Lipid Profile: No results for input(s): "CHOL", "HDL", "LDLCALC", "TRIG", "CHOLHDL", "LDLDIRECT" in the last 72 hours. Thyroid Function Tests: No results for input(s): "TSH", "T4TOTAL", "FREET4", "T3FREE", "THYROIDAB" in the last 72 hours. Anemia Panel: No results for input(s): "VITAMINB12", "FOLATE", "FERRITIN", "TIBC", "IRON", "RETICCTPCT" in the last 72 hours. Urine analysis:    Component Value Date/Time   COLORURINE YELLOW (A) 01/27/2023 0234   APPEARANCEUR CLEAR (A) 01/27/2023 0234   LABSPEC >1.046 (H) 01/27/2023 0234   PHURINE 5.0 01/27/2023 0234   GLUCOSEU NEGATIVE 01/27/2023 0234   HGBUR NEGATIVE 01/27/2023 0234   BILIRUBINUR NEGATIVE 01/27/2023 0234   KETONESUR NEGATIVE 01/27/2023 0234   PROTEINUR NEGATIVE 01/27/2023 0234   NITRITE NEGATIVE 01/27/2023 0234   LEUKOCYTESUR NEGATIVE 01/27/2023 0234    Radiological Exams on Admission: CT HEAD WO CONTRAST ( )  Result Date: 03/08/2023 CLINICAL DATA:  Trauma/fall EXAM: CT HEAD WITHOUT CONTRAST CT CERVICAL SPINE WITHOUT CONTRAST TECHNIQUE: Multidetector CT imaging of the head and cervical spine was performed following the standard protocol without intravenous contrast. Multiplanar CT image reconstructions of the cervical spine were also generated. RADIATION DOSE  REDUCTION: This exam was performed according to the departmental dose-optimization program which includes automated exposure control, adjustment of the mA and/or kV according to patient size and/or use of iterative reconstruction technique. COMPARISON:  MRI brain dated 02/21/2023. CT head/cervical spine dated 01/26/2023. FINDINGS: CT HEAD FINDINGS Brain: No evidence of acute infarction, hemorrhage, hydrocephalus, extra-axial collection or mass lesion/mass effect. Subcortical white matter and periventricular small vessel ischemic changes. Vascular: Intracranial atherosclerosis. Skull: Normal. Negative for fracture or focal lesion. Sinuses/Orbits: Partial opacification of the left mastoid air cells. Visualized paranasal sinuses otherwise clear. Other: None. CT CERVICAL SPINE FINDINGS Alignment: Normal cervical lordosis. Skull base and vertebrae: No acute fracture. No primary bone lesion or focal pathologic process. Soft tissues and spinal canal: No prevertebral fluid or swelling. No visible canal hematoma. Disc levels: Mild degenerative changes of the mid cervical spine. Spinal canal is patent. Upper chest: Visualized lung apices are clear. Other: None. IMPRESSION: No acute intracranial abnormality. Small vessel ischemic changes. No traumatic injury to the cervical spine. Mild degenerative changes. Electronically Signed   By: Charline Bills M.D.   On: 03/08/2023 14:52   CT Cervical Spine Wo Contrast  Result Date: 03/08/2023 CLINICAL DATA:  Trauma/fall EXAM: CT HEAD WITHOUT CONTRAST CT CERVICAL SPINE WITHOUT CONTRAST TECHNIQUE: Multidetector CT imaging of the head and cervical spine was performed following the standard protocol without intravenous contrast. Multiplanar CT image reconstructions of the cervical spine were also generated. RADIATION DOSE REDUCTION: This exam was performed according to the departmental dose-optimization program which includes automated exposure control, adjustment of the mA and/or kV  according to patient size and/or use of iterative reconstruction technique. COMPARISON:  MRI brain dated 02/21/2023. CT head/cervical spine dated 01/26/2023. FINDINGS: CT HEAD FINDINGS Brain: No evidence of acute infarction, hemorrhage, hydrocephalus, extra-axial collection or mass lesion/mass effect. Subcortical white matter and periventricular small vessel ischemic changes. Vascular: Intracranial atherosclerosis. Skull: Normal. Negative for fracture or focal lesion. Sinuses/Orbits: Partial opacification of the left mastoid air cells. Visualized paranasal sinuses otherwise clear. Other: None. CT CERVICAL SPINE FINDINGS Alignment: Normal cervical lordosis. Skull base and vertebrae: No acute fracture. No primary bone lesion or focal pathologic process. Soft tissues and spinal canal: No prevertebral fluid or swelling. No visible canal hematoma. Disc levels: Mild degenerative changes of the mid cervical spine. Spinal  canal is patent. Upper chest: Visualized lung apices are clear. Other: None. IMPRESSION: No acute intracranial abnormality. Small vessel ischemic changes. No traumatic injury to the cervical spine. Mild degenerative changes. Electronically Signed   By: Charline Bills M.D.   On: 03/08/2023 14:52   DG Shoulder Left  Result Date: 03/08/2023 CLINICAL DATA:  Trauma, fall EXAM: LEFT SHOULDER - 2+ VIEW COMPARISON:  01/26/2023 FINDINGS: There is comminuted impacted fracture in the neck of left humerus. There is 7 mm offset in alignment of cortical margins along the lateral aspect. This finding was not distinctly seen in the previous examination. Possible soft tissue calcifications are noted adjacent to the fracture site. There is no dislocation. Osteopenia is seen in bony structures. IMPRESSION: There is comminuted fracture in the neck of left humerus. There is offset in alignment of cortical margins and possible impaction at the fracture site. There is no dislocation. Electronically Signed   By: Ernie Avena M.D.   On: 03/08/2023 14:28   DG Hip Unilat W or Wo Pelvis 2-3 Views Left  Result Date: 03/08/2023 CLINICAL DATA:  Trauma, fall EXAM: DG HIP (WITH OR WITHOUT PELVIS) 2-3V LEFT COMPARISON:  None FINDINGS: There is a recent comminuted fracture in the neck of left femur. There is 4 mm offset in alignment of the fracture fragments. There is shortening of left femoral neck suggesting impaction at the fracture site. Rest of the bony structures show no fractures. Degenerative changes are noted in the visualized lower lumbar spine. IMPRESSION: Recent comminuted impacted fracture is seen in the neck of left femur. Electronically Signed   By: Ernie Avena M.D.   On: 03/08/2023 14:22    EKG: Independently reviewed.see above  Assessment/Plan  Left neck  comminuted impacted femur fracture  -admit to tele  -place on hip fracture protocol  -ortho consult Poggi with plans for OR today  -supportive with pain medications  -patient is cleared for surgery and is a low risk for low-mod surgery  3.9% 30 day risk of death, MI or cardiac arrest based on Cardiac risk index for pre-operative risk    Left shoulder Humeral neck fracture  - supportive care /immobilization  - await further ortho recs   GERD -ppi  Hypothyroidism -resume synthroid    HLD -continue pravastatin    HTN -stable  -resume cardizem  and losartan    SVT -no active issues  -on cardizem   GERD -ppi  Gait instability with falls -hx of BPV-improved with Epely -hx of tremor  left hadn /left hemibody  -patient follows with neurology for these problems, undergoing work up -PT/OT    DVT prophylaxis: per ortho protocol Code Status: full/ as discussed per patient wishes in event of cardiac arrest  Family Communication: none at bedside Disposition Plan: patient  expected to be admitted greater than 2 midnights  Consults called: Dr Joice Lofts Admission status: tele   Lurline Del MD [facility]   If 7PM-7AM, please contact night-coverage [email or web address] Password [facility]  03/08/2023, 4:14 PM

## 2023-03-08 NOTE — ED Notes (Signed)
See triage note  Presents s/p fall  States she leaned over and fell  hitting her head and left arm/hip

## 2023-03-08 NOTE — Anesthesia Preprocedure Evaluation (Addendum)
Anesthesia Evaluation  Patient identified by MRN, date of birth, ID band Patient awake    Reviewed: Allergy & Precautions, NPO status , Patient's Chart, lab work & pertinent test results  History of Anesthesia Complications Negative for: history of anesthetic complications  Airway Mallampati: III   Neck ROM: Full    Dental   Permanent bridges:   Pulmonary former smoker (quit 20 years ago)   Pulmonary exam normal breath sounds clear to auscultation       Cardiovascular hypertension, + Peripheral Vascular Disease (carotid stenosis)  Normal cardiovascular exam Rhythm:Regular Rate:Normal  ECG 01/26/23: NSR  Echo 02/13/21:  NORMAL LEFT VENTRICULAR SYSTOLIC FUNCTION  NORMAL RIGHT VENTRICULAR SYSTOLIC FUNCTION  NO VALVULAR STENOSIS  MILD TR, PR  TRIVIAL MR  EF >55%    Neuro/Psych  PSYCHIATRIC DISORDERS     Dementia negative neurological ROS     GI/Hepatic ,GERD  ,,  Endo/Other  Hypothyroidism    Renal/GU negative Renal ROS     Musculoskeletal  (+) Arthritis ,    Abdominal   Peds  Hematology negative hematology ROS (+)   Anesthesia Other Findings Cardiology note 02/14/22:  Assessment   77 y.o. female with  Encounter Diagnoses  Name Primary?   Essential hypertension Yes   Bilateral carotid artery stenosis   Hyperlipidemia, mixed   SOBOE (shortness of breath on exertion)   Plan  -Proceed to surgery and/or invasive procedure without restriction to pre or post operative and/or procedural care. The patient is at lowest risk possible for cardiovascular complications with surgical intervention and/or invasive procedure. Currently has no evidence active and/or significant angina and/or congestive heart failure.  -Continue current medical regimen for hypertension control which is stable at this time and without apparent significant side effects or symptoms of medications. Further treatment goals of low sodium diet for  additive effects of these medications have been discussed today as well. -The patient has been informed of the significant concerns of carotid atherosclerosis disease process progression. They know that continued treatment of risk factors is the cornerstone to reducing these concerns. We have had a discussion of the future goals in this treatment, medication modification, antiplatelet therapy, statin therapy, diet and exercise. They voice understanding.  -Abstain from statin therapy at this time due to concerns of significant side effects of the medication and or wishes of the patient. We have discussed all risks and benefits of statin therapy in the setting of cardiovascular disease risk. -No further intervention shortness of breath reported above multifactorial in nature including age, decreased exercise tolerance, disability, and/or medication management. The patient is to follow for any worsening symptoms or increase in severity for further in need in investigation or treatment options.  -We have had a long discussion about the benefits of physical and occupational rehabilitation. The patient is advised and encouraged to enroll for improvements in quality of life and reduced hospitalization.  Return in about 1 year (around 02/15/2023).    Reproductive/Obstetrics                             Anesthesia Physical Anesthesia Plan  ASA: 3  Anesthesia Plan: General   Post-op Pain Management:    Induction: Intravenous  PONV Risk Score and Plan: 3 and Ondansetron, Dexamethasone and Treatment may vary due to age or medical condition  Airway Management Planned: LMA  Additional Equipment:   Intra-op Plan:   Post-operative Plan: Extubation in OR  Informed Consent: I have reviewed  the patients History and Physical, chart, labs and discussed the procedure including the risks, benefits and alternatives for the proposed anesthesia with the patient or authorized representative  who has indicated his/her understanding and acceptance.     Dental advisory given  Plan Discussed with: CRNA  Anesthesia Plan Comments: (Patient consented for risks of anesthesia including but not limited to:  - adverse reactions to medications - damage to eyes, teeth, lips or other oral mucosa - nerve damage due to positioning  - sore throat or hoarseness - damage to heart, brain, nerves, lungs, other parts of body or loss of life  Informed patient about role of CRNA in peri- and intra-operative care.  Patient voiced understanding.)        Anesthesia Quick Evaluation

## 2023-03-08 NOTE — Transfer of Care (Signed)
Immediate Anesthesia Transfer of Care Note  Patient: Charlene Thomas  Procedure(s) Performed: Cannulated Screw Fixation of the Left Femoral Neck Fracture (Left: Hip)  Patient Location: PACU  Anesthesia Type:General  Level of Consciousness: drowsy  Airway & Oxygen Therapy: Patient Spontanous Breathing and Patient connected to face mask oxygen  Post-op Assessment: Report given to RN, Post -op Vital signs reviewed and stable, and Patient moving all extremities  Post vital signs: Reviewed and stable  Last Vitals:  Vitals Value Taken Time  BP 156/94 03/08/23 1850  Temp    Pulse 70 03/08/23 1852  Resp 16 03/08/23 1852  SpO2 99 % 03/08/23 1852  Vitals shown include unvalidated device data.  Last Pain:  Vitals:   03/08/23 1535  TempSrc: Oral  PainSc:          Complications: No notable events documented.

## 2023-03-08 NOTE — Consult Note (Signed)
ORTHOPAEDIC CONSULTATION  REQUESTING PHYSICIAN: Chesley Noon, MD  Chief Complaint:   Left hip and left shoulder pain.  History of Present Illness: Charlene Thomas is a 77 y.o. female with multiple medical problems including hypertension, hypothyroidism, gastroesophageal reflux disease, and "heart palpitations" who normally lives independently.  Apparently she was bending over to pick up some scissors which she had dropped on the floor when she lost her balance and fell onto her left side, injuring her left hip, and reinjuring her left shoulder.  The patient was brought to the emergency room where x-rays of the left hip demonstrated a valgus impacted fracture of the left femoral neck, as well as further impaction of a recently fractured left proximal humerus from 5 weeks ago which was being managed nonsurgically.  The patient denies any other injuries.  She did not strike her head or lose consciousness.  The patient also denies any lightheadedness, dizziness, chest pain, shortness of breath, or other symptoms which may have precipitated her fall.  Past Medical History:  Diagnosis Date   Anginal pain (HCC)    Arthritis    Dysrhythmia    TACHYCARDIA   GERD (gastroesophageal reflux disease)    Heart palpitations    Hypertension    Hypothyroidism    Past Surgical History:  Procedure Laterality Date   ABDOMINAL HYSTERECTOMY     CATARACT EXTRACTION W/PHACO Left 05/15/2016   Procedure: CATARACT EXTRACTION PHACO AND INTRAOCULAR LENS PLACEMENT (IOC);  Surgeon: Galen Manila, MD;  Location: [facility] ORS;  Service: Ophthalmology;  Laterality: Left;  [place] 00:34 AP% 20.1 CDE 6.98 Fluid pack lot # [id number]   CATARACT EXTRACTION W/PHACO Right 06/12/2016   Procedure: CATARACT EXTRACTION PHACO AND INTRAOCULAR LENS PLACEMENT (IOC);  Surgeon: Galen Manila, MD;  Location: [facility] ORS;  Service: Ophthalmology;  Laterality: Right;  Lot#  [id number] [place]: 1:05.5 AP%:45.8 CDE:12.87   CESAREAN SECTION     X 3   CHOLECYSTECTOMY     GRAFTS     SKIN GRAFTS FOR BURNS   KIDNEY STONE SURGERY     THROAT SURGERY     Social History   Socioeconomic History   Marital status: Widowed    Spouse name: Not on file   Number of children: Not on file   Years of education: Not on file   Highest education level: Not on file  Occupational History   Not on file  Tobacco Use   Smoking status: Former   Smokeless tobacco: Never  Substance and Sexual Activity   Alcohol use: No   Drug use: No   Sexual activity: Not on file  Other Topics Concern   Not on file  Social History Narrative   Not on file   Social Determinants of Health   Financial Resource Strain: Not on file  Food Insecurity: No Food Insecurity (01/28/2023)   Hunger Vital Sign    Worried About Running Out of Food in the Last Year: Never true    Ran Out of Food in the Last Year: Never true  Transportation Needs: No Transportation Needs (01/28/2023)   PRAPARE - Administrator, Civil Service (Medical): No    Lack of Transportation (Non-Medical): No  Physical Activity: Not on file  Stress: Not on file  Social Connections: Not on file   Family History  Problem Relation Age of Onset   Breast cancer Neg Hx    Allergies  Allergen Reactions   Aspirin    Celecoxib Other (See Comments)    Gi upset  Ezetimibe-Simvastatin     Other Reaction(s): Muscle Pain   Mobic [Meloxicam] Itching   Raloxifene     Other Reaction(s): Unknown  Upset gi   Topiramate Other (See Comments)    Causes severe confusion   Prior to Admission medications   Medication Sig Start Date End Date Taking? Authorizing Provider  cetirizine (ZYRTEC) 10 MG tablet Take 10 mg by mouth daily.    [provider]  diltiazem (CARDIZEM CD) 240 MG 24 hr capsule Take 240 mg by mouth daily. 01/02/23   [provider]  DULoxetine (CYMBALTA) 30 MG capsule Take 30 mg by mouth daily.     [provider]  levothyroxine (SYNTHROID) 125 MCG tablet Take 125 mcg by mouth daily. 12/28/22   [provider]  losartan (COZAAR) 100 MG tablet Take 100 mg by mouth daily. 12/28/22   [provider]  metoprolol tartrate (LOPRESSOR) 25 MG tablet Take 25 mg by mouth 2 (two) times daily. 12/28/22   [provider]  omeprazole (PRILOSEC) 20 MG capsule Take 20 mg by mouth 2 (two) times daily. 02/19/16   [provider]  pravastatin (PRAVACHOL) 40 MG tablet Take 1 tablet (40 mg total) by mouth daily. 01/28/23   Delfino Lovett, MD   CT HEAD WO CONTRAST ( )  Result Date: 03/08/2023 CLINICAL DATA:  Trauma/fall EXAM: CT HEAD WITHOUT CONTRAST CT CERVICAL SPINE WITHOUT CONTRAST TECHNIQUE: Multidetector CT imaging of the head and cervical spine was performed following the standard protocol without intravenous contrast. Multiplanar CT image reconstructions of the cervical spine were also generated. RADIATION DOSE REDUCTION: This exam was performed according to the departmental dose-optimization program which includes automated exposure control, adjustment of the mA and/or kV according to patient size and/or use of iterative reconstruction technique. COMPARISON:  MRI brain dated 02/21/2023. CT head/cervical spine dated 01/26/2023. FINDINGS: CT HEAD FINDINGS Brain: No evidence of acute infarction, hemorrhage, hydrocephalus, extra-axial collection or mass lesion/mass effect. Subcortical white matter and periventricular small vessel ischemic changes. Vascular: Intracranial atherosclerosis. Skull: Normal. Negative for fracture or focal lesion. Sinuses/Orbits: Partial opacification of the left mastoid air cells. Visualized paranasal sinuses otherwise clear. Other: None. CT CERVICAL SPINE FINDINGS Alignment: Normal cervical lordosis. Skull base and vertebrae: No acute fracture. No primary bone lesion or focal pathologic process. Soft tissues and spinal canal: No prevertebral fluid or  swelling. No visible canal hematoma. Disc levels: Mild degenerative changes of the mid cervical spine. Spinal canal is patent. Upper chest: Visualized lung apices are clear. Other: None. IMPRESSION: No acute intracranial abnormality. Small vessel ischemic changes. No traumatic injury to the cervical spine. Mild degenerative changes. Electronically Signed   By: Charline Bills M.D.   On: 03/08/2023 14:52   CT Cervical Spine Wo Contrast  Result Date: 03/08/2023 CLINICAL DATA:  Trauma/fall EXAM: CT HEAD WITHOUT CONTRAST CT CERVICAL SPINE WITHOUT CONTRAST TECHNIQUE: Multidetector CT imaging of the head and cervical spine was performed following the standard protocol without intravenous contrast. Multiplanar CT image reconstructions of the cervical spine were also generated. RADIATION DOSE REDUCTION: This exam was performed according to the departmental dose-optimization program which includes automated exposure control, adjustment of the mA and/or kV according to patient size and/or use of iterative reconstruction technique. COMPARISON:  MRI brain dated 02/21/2023. CT head/cervical spine dated 01/26/2023. FINDINGS: CT HEAD FINDINGS Brain: No evidence of acute infarction, hemorrhage, hydrocephalus, extra-axial collection or mass lesion/mass effect. Subcortical white matter and periventricular small vessel ischemic changes. Vascular: Intracranial atherosclerosis. Skull: Normal. Negative for fracture or focal lesion.  Sinuses/Orbits: Partial opacification of the left mastoid air cells. Visualized paranasal sinuses otherwise clear. Other: None. CT CERVICAL SPINE FINDINGS Alignment: Normal cervical lordosis. Skull base and vertebrae: No acute fracture. No primary bone lesion or focal pathologic process. Soft tissues and spinal canal: No prevertebral fluid or swelling. No visible canal hematoma. Disc levels: Mild degenerative changes of the mid cervical spine. Spinal canal is patent. Upper chest: Visualized lung apices  are clear. Other: None. IMPRESSION: No acute intracranial abnormality. Small vessel ischemic changes. No traumatic injury to the cervical spine. Mild degenerative changes. Electronically Signed   By: Charline Bills M.D.   On: 03/08/2023 14:52   DG Shoulder Left  Result Date: 03/08/2023 CLINICAL DATA:  Trauma, fall EXAM: LEFT SHOULDER - 2+ VIEW COMPARISON:  01/26/2023 FINDINGS: There is comminuted impacted fracture in the neck of left humerus. There is 7 mm offset in alignment of cortical margins along the lateral aspect. This finding was not distinctly seen in the previous examination. Possible soft tissue calcifications are noted adjacent to the fracture site. There is no dislocation. Osteopenia is seen in bony structures. IMPRESSION: There is comminuted fracture in the neck of left humerus. There is offset in alignment of cortical margins and possible impaction at the fracture site. There is no dislocation. Electronically Signed   By: Ernie Avena M.D.   On: 03/08/2023 14:28   DG Hip Unilat W or Wo Pelvis 2-3 Views Left  Result Date: 03/08/2023 CLINICAL DATA:  Trauma, fall EXAM: DG HIP (WITH OR WITHOUT PELVIS) 2-3V LEFT COMPARISON:  None FINDINGS: There is a recent comminuted fracture in the neck of left femur. There is 4 mm offset in alignment of the fracture fragments. There is shortening of left femoral neck suggesting impaction at the fracture site. Rest of the bony structures show no fractures. Degenerative changes are noted in the visualized lower lumbar spine. IMPRESSION: Recent comminuted impacted fracture is seen in the neck of left femur. Electronically Signed   By: Ernie Avena M.D.   On: 03/08/2023 14:22    Positive ROS: All other systems have been reviewed and were otherwise negative with the exception of those mentioned in the HPI and as above.  Physical Exam: General:  Alert, no acute distress Psychiatric:  Patient is competent for consent with normal mood and affect    Cardiovascular:  No pedal edema Respiratory:  No wheezing, non-labored breathing GI:  Abdomen is soft and non-tender Skin:  No lesions in the area of chief complaint Neurologic:  Sensation intact distally Lymphatic:  No axillary or cervical lymphadenopathy  Orthopedic Exam:  Orthopedic examination is limited to the left hip and lower extremity.  Skin inspection around the left hip is unremarkable.  No swelling, erythema, ecchymosis, abrasions, or other skin abnormalities are identified.  Her left lower extremity is symmetrically aligned to the right lower extremity.  She has mild tenderness palpation over the lateral aspect of the left hip.  She has more severe pain with any attempted active or passive range of motion of the left hip.  She is grossly neurovascularly intact to the left lower extremity and foot.  Orthopedic examination also is performed of the left shoulder and upper extremity.  Skin inspection around the left shoulder is notable for mild swelling, but otherwise is unremarkable.  No erythema, ecchymosis, abrasions, or other skin abnormalities are identified.  She has moderate tenderness to palpation over the anterolateral aspect of the shoulder.  She has more severe pain with any attempted active or  passive motion of the shoulder.  She is grossly neurovascularly intact to the left upper extremity and hand.  X-rays:  Recent x-rays of the pelvis and left hip are available for review and have been reviewed by myself.  These films demonstrate a valgus impacted left femoral neck fracture.  No significant degenerative changes of the hip joint are noted.  No lytic lesions or other acute bony abnormalities are identified.  Recent x-rays of the left shoulder also are available for review and have been reviewed by myself.  These films demonstrate an impacted left proximal humerus fracture.  The impaction of this fracture appears to be increased as compared to her prior films from 6 weeks ago  when she had previously fallen and broken this shoulder.  However, the humeral head is concentrically located within the glenoid.  There is evidence of diffuse osteopenia, but no other acute bony abnormalities are identified.  Assessment: 1.  Valgus impacted left femoral neck fracture. 2.  Impacted left proximal humerus fracture.  Plan: The treatment options, including both surgical and nonsurgical choices, have been discussed in detail with the patient regarding her left hip injury.  The patient would like to proceed with surgical intervention to include an in situ cannulated screw fixation of the valgus impacted left femoral neck fracture.  The risks (including bleeding, infection, nerve and/or blood vessel injury, persistent or recurrent pain, loosening or failure of the screws, malunion and/or nonunion, leg length inequality, need for further surgery, blood clots, strokes, heart attacks or arrhythmias, pneumonia, etc.) and benefits of the surgical procedure were discussed.  The patient states her understanding and agrees to proceed.  A formal written consent will be obtained by the nursing staff.  Regarding her left shoulder injury, the shoulder may continue to be managed nonsurgically.  She will be placed back into a shoulder immobilizer which she will have to wear for the next month or so, removing it only for bathing purposes.  This will make it difficult for her to rehab from her left hip fracture.  Therefore, the patient most likely will require rehab placement.   Thank you for asking me to participate in the care of this most pleasant yet unfortunate woman.  I will be happy to follow her with you.   Maryagnes Amos, MD  Beeper #:  [phone]  03/08/2023 4:53 PM

## 2023-03-08 NOTE — ED Triage Notes (Signed)
Pt to ED from home POV after fall last night. Complains of L hip and L arm pain. Was standing with cane and leaned over and toppled forward. Pt broke L arm 3-4 weeks ago after fall and has been having home PT.

## 2023-03-08 NOTE — Plan of Care (Signed)
  Problem: Education: Goal: Knowledge of General Education information will improve Description: Including pain rating scale, medication(s)/side effects and non-pharmacologic comfort measures 03/08/2023 2252 by Deirdre Pippins, RN Outcome: Progressing 03/08/2023 2249 by Deirdre Pippins, RN Outcome: Progressing   Problem: Pain Managment: Goal: General experience of comfort will improve 03/08/2023 2252 by Deirdre Pippins, RN Outcome: Progressing 03/08/2023 2249 by Deirdre Pippins, RN Outcome: Progressing   Problem: Safety: Goal: Ability to remain free from injury will improve 03/08/2023 2252 by Deirdre Pippins, RN Outcome: Progressing 03/08/2023 2249 by Deirdre Pippins, RN Outcome: Progressing   Problem: Skin Integrity: Goal: Risk for impaired skin integrity will decrease 03/08/2023 2252 by Deirdre Pippins, RN Outcome: Progressing 03/08/2023 2249 by Deirdre Pippins, RN Outcome: Progressing

## 2023-03-08 NOTE — Op Note (Signed)
03/08/2023  6:57 PM  Patient:   Charlene Thomas  Pre-Op Diagnosis:   1.  Valgus-impacted left femoral neck fracture.  2.  Impacted left proximal humerus fracture.  Post-Op Diagnosis:   Same.  Procedure:   1.  In situ cannulated screw fixation of valgus-impacted left femoral neck fracture.  2.  Application of shoulder immobilizer, left shoulder  Surgeon:   Maryagnes Amos, MD  Assistant:   None  Anesthesia:   LMA  Findings:   As above.  Complications:   None  EBL:   20 cc  Fluids:   600 cc crystalloid  UOP:   None  TT:   None  Drains:   None  Closure:   Staples  Implants:   Biomet 6.5 mm cannulated screws (16 mm) 3 with washers.  Brief Clinical Note:   The patient is a 77 year old female who sustained the above-noted injuries earlier today when she apparently lost her balance and fell while trying to bend over to pick up some scissors which she had dropped on the floor. The patient presented to the emergency room where x-rays demonstrated the above-noted findings. The patient has been cleared medically and presents at this time for definitive management of this injury.  Procedure:   The patient was brought into the operating room and lain in the supine position. After adequate general laryngeal mask anesthesia was obtained, the patient was transferred to the fracture table. The uninjured leg was placed in a flexed and abducted position over the well-leg holder while the operative leg was placed in gentle longitudinal traction with some internal rotation. The adequacy of fracture position was verified fluoroscopically in AP and lateral projections before the lateral aspect of the left hip and thigh were prepped with ChloraPrep solution and draped sterilely. Preoperative antibiotics were administered. A timeout was performed to verify the appropriate surgical site.   An approximately 3-4 cm incision was made over the lateral aspect of the lower part of the greater trochanter as  verified fluoroscopically. This incision was carried down through the subcutaneous cutaneous tissues to expose the iliotibial band. This was split the length the incision to expose the lateral aspect of the proximal femur at the level of the inferior most part of the greater trochanter. Under fluoroscopic guidance, a guide wire was drilled up through the femoral neck along the calcar into the femoral head to rest within 5 mm of subchondral bone. Its position was assessed fluoroscopically in AP and lateral projections and found to be excellent. Two additional guide wires were placed in parallel fashion more proximally, anterior and posterior to the original pin in an inverted triangular configuration. Again the position of these pins was verified fluoroscopically in AP, lateral, and oblique projections and found to be excellent.   The length of each of these pins was measured before each pin was overdrilled with the appropriate cannulated drill. Each of these screws was inserted and advanced to within 8-10 mm of subchondral bone. One screw was deemed to be too long so it was replaced with a shorter screw. Each screw was placed with a washer. Again the position of each of these screws was assessed fluoroscopically in AP, lateral, and oblique projections, and found to be excellent.  The wound was copiously irrigated with sterile saline solution before the IT band was reapproximated using #0 Vicryl interrupted sutures. The subcutaneous tissues also were closed using 2-0 Vicryl interrupted sutures before the skin was closed using staples. A total of 20 cc  of 0.5% Sensorcaine with epinephrine was injected in and around the surgical incision before a sterile occlusive dressing was applied to the wound.   At this point, the left upper extremity was placed into a shoulder immobilizer. The patient was then awakened, extubated, and returned to the recovery room in satisfactory condition after tolerating the procedure  well.

## 2023-03-08 NOTE — ED Notes (Signed)
Contacted provider for appropriate xray orders for L arm.

## 2023-03-09 DIAGNOSIS — F03918 Unspecified dementia, unspecified severity, with other behavioral disturbance: Secondary | ICD-10-CM

## 2023-03-09 DIAGNOSIS — K219 Gastro-esophageal reflux disease without esophagitis: Secondary | ICD-10-CM

## 2023-03-09 DIAGNOSIS — I10 Essential (primary) hypertension: Secondary | ICD-10-CM

## 2023-03-09 DIAGNOSIS — S72002S Fracture of unspecified part of neck of left femur, sequela: Secondary | ICD-10-CM

## 2023-03-09 DIAGNOSIS — S72002A Fracture of unspecified part of neck of left femur, initial encounter for closed fracture: Principal | ICD-10-CM

## 2023-03-09 DIAGNOSIS — D649 Anemia, unspecified: Secondary | ICD-10-CM | POA: Insufficient documentation

## 2023-03-09 DIAGNOSIS — S42292S Other displaced fracture of upper end of left humerus, sequela: Secondary | ICD-10-CM

## 2023-03-09 DIAGNOSIS — E039 Hypothyroidism, unspecified: Secondary | ICD-10-CM

## 2023-03-09 LAB — CBC
HCT: 34.1 % — ABNORMAL LOW (ref 36.0–46.0)
Hemoglobin: 10.5 g/dL — ABNORMAL LOW (ref 12.0–15.0)
MCH: 24.4 pg — ABNORMAL LOW (ref 26.0–34.0)
MCHC: 30.8 g/dL (ref 30.0–36.0)
MCV: 79.1 fL — ABNORMAL LOW (ref 80.0–100.0)
Platelets: 217 10*3/uL (ref 150–400)
RBC: 4.31 MIL/uL (ref 3.87–5.11)
RDW: 14.6 % (ref 11.5–15.5)
WBC: 5.4 10*3/uL (ref 4.0–10.5)
nRBC: 0 % (ref 0.0–0.2)

## 2023-03-09 LAB — VITAMIN D 25 HYDROXY (VIT D DEFICIENCY, FRACTURES): Vit D, 25-Hydroxy: 21.06 ng/mL — ABNORMAL LOW (ref 30–100)

## 2023-03-09 LAB — BASIC METABOLIC PANEL
Anion gap: 10 (ref 5–15)
BUN: 12 mg/dL (ref 8–23)
CO2: 23 mmol/L (ref 22–32)
Calcium: 9.1 mg/dL (ref 8.9–10.3)
Chloride: 104 mmol/L (ref 98–111)
Creatinine, Ser: 0.82 mg/dL (ref 0.44–1.00)
GFR, Estimated: >60 mL/min (ref >60)
Glucose, Bld: 149 mg/dL — ABNORMAL HIGH (ref 70–99)
Potassium: 3.9 mmol/L (ref 3.5–5.1)
Sodium: 137 mmol/L (ref 135–145)

## 2023-03-09 MED ORDER — POLYETHYLENE GLYCOL 3350 17 G PO PACK
17.0000 g | PACK | Freq: Two times a day (BID) | ORAL | Status: DC
  Administered 2023-03-09: 17 g via ORAL
  Filled 2023-03-09 (×2): qty 1

## 2023-03-09 NOTE — Assessment & Plan Note (Signed)
Patient had an in situ cannulated screw fixation of valgus impacted left femoral neck fracture done by Dr. Joice Lofts on 5/24.  Did very well with physical therapy they recommended home with home health.  Follow-up with orthopedic surgery as outpatient for wound care.  Orthopedic surgery prescribed Lovenox injections and pain medications.  Patient already on high-dose vitamin D will add calcium and Fosamax (Fosamax may need authorization)

## 2023-03-09 NOTE — Progress Notes (Signed)
Physical Therapy Treatment Patient Details Name: Charlene Thomas MRN: [id number] DOB: [birth year] Today's Date: 03/09/2023   History of Present Illness Pt is a 77 y.o. female with medical history significant for HTN, hypothyroidism, chronic diarrhea following cholecystectomy several years ago who had a fall and suffered L hip fracture and reinjury of recent L humerous fracture.  Now s/p hip fixation 5/24.    PT Comments    Pt did well with PT exam, displayed surprisingly little pain with all acts, showed good L hip AROM and even against some resistance.  Pt was pleasant and very eager to see what she could do and actually did 2 bouts of ambulation 20, then 40 ft with HemiWalker.  Pt self selects shuffling gait (per son this has been recent baseline) but with consistent cuing/encouragement she was able to display improved step-through cadence with CGA, pt's O2 remained in the 90s on room air t/o the effort.  Pt will benefit from continued PT to address functional limitations and promote safe discharge.    Recommendations for follow up therapy are one component of a multi-disciplinary discharge planning process, led by the attending physician.  Recommendations may be updated based on patient status, additional functional criteria and insurance authorization.  Follow Up Recommendations       Assistance Recommended at Discharge Frequent or constant Supervision/Assistance  Patient can return home with the following A little help with walking and/or transfers;A little help with bathing/dressing/bathroom;Assistance with cooking/housework;Help with stairs or ramp for entrance   Equipment Recommendations   (hemi walker)    Recommendations for Other Services       Precautions / Restrictions Precautions Precautions: Fall Required Braces or Orthoses: Sling Restrictions Weight Bearing Restrictions: Yes LUE Weight Bearing: Non weight bearing LLE Weight Bearing: Weight bearing as tolerated      Mobility  Bed Mobility Overal bed mobility: Needs Assistance Bed Mobility: Supine to Sit     Supine to sit: Min assist     General bed mobility comments: Pt showed great effort with getting to EOB, heavy use of rail with R UE and needing some assist with getting L LE and hip around to EOB but needing only minA  to get to sitting    Transfers Overall transfer level: Needs assistance Equipment used: Hemi-walker Transfers: Sit to/from Stand Sit to Stand: Min assist, Min guard           General transfer comment: Pt was able to rise to standing from recliner (R arm rest use) w/ only CGA, did need minA from bed w/o elevated UE usage point    Ambulation/Gait Ambulation/Gait assistance: Min assist Gait Distance (Feet): 40 Feet Assistive device: Hemi-walker         General Gait Details: 2 bouts of ambulation; 20 ft, 40 ft with hemiwalker and CGA.  Pt self selects choppy, shuffling gait but with gait training cues did show ability to intermittently do step-through, safer cadence, constant cuing to maintain.  Pt with expected mild fatigue but O2 remained in the 90 on room air t/o the effort   Stairs             Wheelchair Mobility    Modified Rankin (Stroke Patients Only)       Balance Overall balance assessment: Needs assistance Sitting-balance support: Single extremity supported Sitting balance-Leahy Scale: Fair Sitting balance - Comments: tending to lean away from L hip, but once assited to square with EOB she could use R UE able to maintain static sitting with only  CGA   Standing balance support: Single extremity supported Standing balance-Leahy Scale: Fair Standing balance comment: reliant on HW, no LOBs but shuffling gait and lack of awareness of posture/positioning necessitated close CGA                            Cognition Arousal/Alertness: Awake/alert Behavior During Therapy: WFL for tasks assessed/performed Overall Cognitive Status:  Within Functional Limits for tasks assessed                                 General Comments: pt able to follow simple cuing and answer basic questions but showed low grade confusion t/o the session, son present and reports she has not fully been herself for ~4 months with increasing confusion        Exercises General Exercises - Lower Extremity Ankle Circles/Pumps: AROM, 5 reps Quad Sets: Strengthening, 5 reps Heel Slides: AROM, Strengthening, 10 reps (resisted leg ext) Hip ABduction/ADduction: Strengthening, 10 reps    General Comments General comments (skin integrity, edema, etc.): pt highly motivated to work with PT      Pertinent Vitals/Pain Pain Assessment Pain Assessment: 0-10 Pain Score: 1  Pain Location: pt reports very little L shoulder or hip pain, even with activity    Home Living Family/patient expects to be discharged to:: Private residence Living Arrangements: Alone Available Help at Discharge: Family;Available 24 hours/day (DIL plans to stay with her as long as needed and other family can help PRN) Type of Home: House Home Access: Ramped entrance       Home Layout: Two level;Able to live on main level with bedroom/bathroom Home Equipment: Rolling Walker (2 wheels);Rollator (4 wheels);Cane - single point;Shower seat - built in;Grab bars - toilet;Grab bars - tub/shower;Other (comment);Wheelchair - manual Additional Comments: has a bed rail    Prior Function            PT Goals (current goals can now be found in the care plan section) Acute Rehab PT Goals Patient Stated Goal: go home PT Goal Formulation: With patient Time For Goal Achievement: 03/22/23 Potential to Achieve Goals: Good    Frequency    7X/week      PT Plan      Co-evaluation              AM-PAC PT "6 Clicks" Mobility   Outcome Measure  Help needed turning from your back to your side while in a flat bed without using bedrails?: A Little Help needed moving  from lying on your back to sitting on the side of a flat bed without using bedrails?: A Little Help needed moving to and from a bed to a chair (including a wheelchair)?: A Little Help needed standing up from a chair using your arms (e.g., wheelchair or bedside chair)?: A Little Help needed to walk in hospital room?: A Little Help needed climbing 3-5 steps with a railing? : Total 6 Click Score: 16    End of Session Equipment Utilized During Treatment: Gait belt Activity Tolerance: Patient tolerated treatment well;Patient limited by fatigue Patient left: with chair alarm set;with call bell/phone within reach;with family/visitor present Nurse Communication: Mobility status PT Visit Diagnosis: Muscle weakness (generalized) (M62.81);Difficulty in walking, not elsewhere classified (R26.2);Pain Pain - Right/Left: Left Pain - part of body: Hip;Shoulder     Time: 0810-0850 PT Time Calculation (min) (ACUTE ONLY): 40 min  Charges:  $Gait  Training: 8-22 mins $Therapeutic Exercise: 8-22 mins                     Malachi Pro, DPT 03/09/2023, 10:35 AM

## 2023-03-09 NOTE — Progress Notes (Signed)
Progress Note   Patient: Charlene Thomas ZOX:[id number] DOB: 10/31/1945 DOA: 03/08/2023     1 DOS: the patient was seen and examined on 03/09/2023   Brief hospital course: 77 y.o. female with medical history significant of GERD, Hypothyroidism,  HLD, HTN, SVT, gait instability with falls , RLS,  hx of Benign positional vertigo with nystagmus, tremor of left hand and left hemibody  bradykinesia who presents to ED s/p mechanical fall  after attempting to pick up item off floor.  Patient states she fell on her left side and noted left arm and hip pain s/p fall. She notes prior to fall she was in her normal state of health. She denies any chest pain , sob/ fever/chills/ cough / n/v/d/ or dysuria.  She notes she only has pain with movement in regards to her arm and left hip. She also has interim history of fall one month ago on her left were she sustained fx of left humeral neck.  5/25.  Patient seen while sitting in chair.  Stated she walked well with physical therapy but then told me that her memory is not that good.  Physical therapy also confirmed that she walked 20 feet and then another 40 feet.  Plan will be to go home with home health.  Assessment and Plan: * Closed hip fracture requiring operative repair, left, sequela Patient had an in situ cannulated screw fixation of valgus impacted left femoral neck fracture done by Dr. Trinda Pascal on 5/24.  Humeral head fracture, left, sequela Left arm immobilizer placed by orthopedics.  Dementia with behavioral disturbance (HCC) Follows as outpatient with Dr. Sherryll Burger neurology.  Hypertension On Cardizem and losartan  Hypothyroidism On Synthroid  GERD (gastroesophageal reflux disease) On PPI  Postoperative anemia Today's hemoglobin 10.5.  Discontinue IV fluids.        Subjective: Patient feels okay.  Had left hip operation yesterday.  Not having much pain.  Stated she walked well with physical therapy but then states that her memory is not that  good.  Physical therapy confirmed that she walks well today.  Physical Exam: Vitals:   03/08/23 2207 03/08/23 2325 03/09/23 0351 03/09/23 0917  BP: (!) 143/91 (!) 143/83 (!) 140/87 120/78  Pulse: 83 79 80 (!) 105  Resp: 18 16 20 18   Temp:  98 F (36.7 C) 97.8 F (36.6 C) 98 F (36.7 C)  TempSrc:  Oral  Oral  SpO2: 95% 96% 97% 95%  Weight:      Height:       Physical Exam HENT:     Head: Normocephalic.     Mouth/Throat:     Pharynx: No oropharyngeal exudate.  Eyes:     General: Lids are normal.     Conjunctiva/sclera: Conjunctivae normal.  Cardiovascular:     Rate and Rhythm: Normal rate and regular rhythm.     Heart sounds: Normal heart sounds, S1 normal and S2 normal.  Pulmonary:     Breath sounds: No decreased breath sounds, wheezing, rhonchi or rales.  Abdominal:     Palpations: Abdomen is soft.     Tenderness: There is no abdominal tenderness.  Musculoskeletal:     Right lower leg: No swelling.     Left lower leg: No swelling.  Skin:    General: Skin is warm.     Findings: No rash.  Neurological:     Mental Status: She is alert.     Comments: Answers questions appropriately.  Able to straight leg raise with right  leg but unable to do it with the left.     Data Reviewed: Creatinine 0.82, hemoglobin 10.5 Family Communication: Spoke with patient's daughter-in-law on the phone  Disposition: Status is: Inpatient Remains inpatient appropriate because: Postoperative day 1 for hip fracture repair.  Planned Discharge Destination: Home with Home Health    Time spent: 28 minutes  Author: Alford Highland, MD 03/09/2023 11:45 AM  For on call review [email or web address].

## 2023-03-09 NOTE — Assessment & Plan Note (Signed)
Today's hemoglobin 10.5.  Discontinue IV fluids.

## 2023-03-09 NOTE — Progress Notes (Signed)
Physical Therapy Treatment Patient Details Name: Charlene Thomas MRN: [id number] DOB: 1945/12/13 Today's Date: 03/09/2023   History of Present Illness Pt is a 77 y.o. female with medical history significant for HTN, hypothyroidism, chronic diarrhea following cholecystectomy several years ago who had a fall and suffered L hip fracture and reinjury of recent L humerous fracture.  Now s/p hip fixation 5/24.    PT Comments    Pt pleasant and motivated, eager to try some ambulation despite having started dinner.  She ultimately was able to show increased confidence, speed and safety with 75 ft of ambulation using her newly issued hemiwalker.  Pt still needing plenty of cuing for toe clearance and step length but able to show more consistent improved cadence t/o the effort.  Pt very talkative but able to maintain appropriate vitals with nearly twice as long a bout of ambulation as this AM.  Pt doing well, continue with POC.    Recommendations for follow up therapy are one component of a multi-disciplinary discharge planning process, led by the attending physician.  Recommendations may be updated based on patient status, additional functional criteria and insurance authorization.  Follow Up Recommendations       Assistance Recommended at Discharge Frequent or constant Supervision/Assistance  Patient can return home with the following A little help with walking and/or transfers;A little help with bathing/dressing/bathroom;Assistance with cooking/housework;Help with stairs or ramp for entrance   Equipment Recommendations   (hemiwalker recieved)    Recommendations for Other Services       Precautions / Restrictions Precautions Precautions: Fall Required Braces or Orthoses: Sling Restrictions LUE Weight Bearing: Non weight bearing LLE Weight Bearing: Weight bearing as tolerated     Mobility  Bed Mobility Overal bed mobility: Needs Assistance Bed Mobility: Supine to Sit, Sit to Supine      Supine to sit: Min assist Sit to supine: Min assist   General bed mobility comments: again great effort during slow and labored transtions.  Only very light assist with getting up to sitting to scoot to EOB, more assist needed with getting LEs back into bed post ambulation    Transfers Overall transfer level: Needs assistance Equipment used: Hemi-walker Transfers: Sit to/from Stand Sit to Stand: Min guard           General transfer comment: Cuing for weight shift and hand placement but able to rise to standing with only CGA from standard height bed this afternoon    Ambulation/Gait Ambulation/Gait assistance: Min guard Gait Distance (Feet): 75 Feet Assistive device: Hemi-walker         General Gait Details: Pt's HW arrived, adjusted to appropriate height.  Pt still needing consistent cuing to avoid halting/shuffling gait but did show ability to maintain step-through cadence for brief episodes with reversion (though less severe than this AM) to shuffling/step-to gait.  Pt reports fatigue post ambulation but vitals stable and appropriate.   Stairs             Wheelchair Mobility    Modified Rankin (Stroke Patients Only)       Balance Overall balance assessment: Needs assistance Sitting-balance support: Single extremity supported Sitting balance-Leahy Scale: Good Sitting balance - Comments: better confidence and stability in sitting this afternon   Standing balance support: Single extremity supported Standing balance-Leahy Scale: Good Standing balance comment: some impulsivity and need for reminders with HW, but no LOBs and less heavy reliance on walker during dynamic tasks  Cognition Arousal/Alertness: Awake/alert Behavior During Therapy: WFL for tasks assessed/performed Overall Cognitive Status: Within Functional Limits for tasks assessed                                 General Comments: at recent  baseline with mild confusion but ability to follow basic cing        Exercises General Exercises - Lower Extremity Ankle Circles/Pumps: AROM, 5 reps Quad Sets: Strengthening, 5 reps Heel Slides: AROM, Strengthening, 10 reps (resisted leg ext) Hip ABduction/ADduction: Strengthening, 10 reps    General Comments General comments (skin integrity, edema, etc.): pt very motivated and very talkative t/o session      Pertinent Vitals/Pain Pain Assessment Pain Assessment: 0-10 Pain Score: 0-No pain Pain Location: pt reports no L shoulder or hip pain, even with activity    Home Living                          Prior Function            PT Goals (current goals can now be found in the care plan section) Progress towards PT goals: Progressing toward goals    Frequency    7X/week      PT Plan Current plan remains appropriate    Co-evaluation              AM-PAC PT "6 Clicks" Mobility   Outcome Measure  Help needed turning from your back to your side while in a flat bed without using bedrails?: A Little Help needed moving from lying on your back to sitting on the side of a flat bed without using bedrails?: A Little Help needed moving to and from a bed to a chair (including a wheelchair)?: A Little Help needed standing up from a chair using your arms (e.g., wheelchair or bedside chair)?: A Little Help needed to walk in hospital room?: A Little Help needed climbing 3-5 steps with a railing? : A Lot 6 Click Score: 17    End of Session Equipment Utilized During Treatment: Gait belt Activity Tolerance: Patient tolerated treatment well;Patient limited by fatigue Patient left: with bed alarm set;with call bell/phone within reach   PT Visit Diagnosis: Muscle weakness (generalized) (M62.81);Difficulty in walking, not elsewhere classified (R26.2);Pain Pain - Right/Left: Left Pain - part of body: Hip;Shoulder     Time: 1610-9604 PT Time Calculation (min) (ACUTE  ONLY): 18 min  Charges:  $Gait Training: 8-22 mins                     Malachi Pro, DPT 03/09/2023, 5:51 PM

## 2023-03-09 NOTE — Progress Notes (Signed)
  Subjective: 1 Day Post-Op Procedure(s) (LRB): Cannulated Screw Fixation of the Left Femoral Neck Fracture (Left) Left proximal humerus fracture, being treated non-op Patient reports pain as mild.   Patient is well, and has had no acute complaints or problems PT and care management to assist with discharge planning, patient will likely need SNF. Negative for chest pain and shortness of breath Fever: no Gastrointestinal:Negative for nausea and vomiting  Objective: Vital signs in last 24 hours: Temp:  [96.8 F (36 C)-98.5 F (36.9 C)] 97.8 F (36.6 C) (05/25 0351) Pulse Rate:  [65-88] 80 (05/25 0351) Resp:  [7-20] 20 (05/25 0351) BP: (110-160)/(74-102) 140/87 (05/25 0351) SpO2:  [87 %-100 %] 97 % (05/25 0351) Weight:  [72.6 kg] 72.6 kg (05/24 1322)  Intake/Output from previous day:  Intake/Output Summary (Last 24 hours) at 03/09/2023 0750 Last data filed at 03/09/2023 0703 Gross per 24 hour  Intake 980 ml  Output 220 ml  Net 760 ml    Intake/Output this shift: Total I/O In: -  Out: 200 [Urine:200]  Labs: Recent Labs    03/08/23 1531 03/09/23 0455  HGB 12.2 10.5*   Recent Labs    03/08/23 1531 03/09/23 0455  WBC 8.0 5.4  RBC 4.93 4.31  HCT 39.2 34.1*  PLT 249 217   Recent Labs    03/08/23 1531 03/09/23 0455  NA 135 137  K 3.8 3.9  CL 101 104  CO2 22 23  BUN 12 12  CREATININE 0.78 0.82  GLUCOSE 98 149*  CALCIUM 9.6 9.1   No results for input(s): "LABPT", "INR" in the last 72 hours.   EXAM General - Patient is Alert, Appropriate, and Oriented Extremity - Left arm in shoulder immobilizer. Intact to light touch the left arm, able to flex and extend elbow and wrist without pain. Able to make a fist, radial pulse intact. Left thigh soft to palpation.  Incision site clean without any drainage noted. Negative Homans test, intact dorsalis pedis and posterior tibial pulse intact. Dressing/Incision - clean, dry, no drainage to the left hip. Motor Function  - intact, moving foot and toes well on exam.    Past Medical History:  Diagnosis Date   Anginal pain (HCC)    Arthritis    Dysrhythmia    TACHYCARDIA   GERD (gastroesophageal reflux disease)    Heart palpitations    Hypertension    Hypothyroidism     Assessment/Plan: 1 Day Post-Op Procedure(s) (LRB): Cannulated Screw Fixation of the Left Femoral Neck Fracture (Left) Principal Problem:   S/p left hip fracture  Estimated body mass index is 29.26 kg/m as calculated from the following:   Height as of this encounter: 5\' 2"  (1.575 m).   Weight as of this encounter: 72.6 kg. Advance diet Up with therapy D/C IV fluids when tolerating po intake.  Labs reviewed this AM, Hg 10.5. Patient is passing gas, continue to work on BM. Up with therapy today.  Care management to assist with discharge, will likely need SNF.  DVT Prophylaxis - TED hose and Lovenox Weight-Bearing as tolerated to left leg, non-weightbearing to the left arm.  Valeria Batman, PA-C [facility] 03/09/2023, 7:50 AM

## 2023-03-09 NOTE — Assessment & Plan Note (Signed)
On Synthroid

## 2023-03-09 NOTE — Plan of Care (Signed)
  Problem: Education: Goal: Knowledge of General Education information will improve Description: Including pain rating scale, medication(s)/side effects and non-pharmacologic comfort measures Outcome: Progressing   Problem: Coping: Goal: Level of anxiety will decrease Outcome: Progressing   Problem: Activity: Goal: Risk for activity intolerance will decrease Outcome: Progressing   Problem: Safety: Goal: Ability to remain free from injury will improve Outcome: Progressing   Problem: Pain Managment: Goal: General experience of comfort will improve Outcome: Progressing   Problem: Skin Integrity: Goal: Risk for impaired skin integrity will decrease Outcome: Progressing

## 2023-03-09 NOTE — TOC Initial Note (Signed)
Transition of Care [facility]) - Initial/Assessment Note    Patient Details  Name: Charlene Thomas MRN: [id number] Date of Birth: 1946/09/03  Transition of Care [facility]) CM/SW Contact:    Bing Quarry, RN Phone Number: 03/09/2023, 12:04 PM  Clinical Narrative:  5/25: POD#1 for Left Hip surgery on 5/24 due to a fall wile leaning on a cane.  Left Humerus Fx.with immobilizer.  Patient from home where lives alone but DIL Judeth Cornfield has been staying with her since fall a few weeks ago. Active with [facility] and will resume service/PT. Hemi Walker ordered for delivery to room per [facility]. Spoke to son, Tawanna Cooler, regarding potential discharge plan/DME, living situation. DIL planning to stay with patient for a while after this discharge. No preference on DME agency.  DIL in room per son. Pt say today and may discharge today. Notified Cory/[facility] of potential discharge.              PCP: Gerlene Fee RX: [facility] [phone] [place] [facility], [place] - [address] [[id number]]  Contact: No DPR: Askari,Todd (Son)  [phone] [facility])  Depaz,Stephanie  (DIL) [phone] (Staying with/caring for pt on discharge).  Gabriel Cirri RN CM                         Barriers to Discharge: Barriers Resolved   Patient Goals and CMS Choice            Expected Discharge Plan and Services                         DME Arranged: Walker hemi DME Agency: [employer] Date DME Agency Contacted: 03/09/23 Time DME Agency Contacted: 1204 Representative spoke with at DME Agency: Leavy Cella HH Arranged: PT HH Agency: [facility] Date [facility] Agency Contacted: 03/09/23 Time HH Agency Contacted: 1204 Representative spoke with at [facility] Agency: Kandee Keen  Prior Living Arrangements/Services                       Activities of Daily Living Home Assistive Devices/Equipment: Medical laboratory scientific officer (specify quad or straight), Walker (specify type) ADL Screening (condition at time of  admission) Patient's cognitive ability adequate to safely complete daily activities?: No Is the patient deaf or have difficulty hearing?: No Does the patient have difficulty seeing, even when wearing glasses/contacts?: No Does the patient have difficulty concentrating, remembering, or making decisions?: Yes Patient able to express need for assistance with ADLs?: Yes Does the patient have difficulty dressing or bathing?: Yes Independently performs ADLs?: No Does the patient have difficulty walking or climbing stairs?: Yes Weakness of Legs: Both Weakness of Arms/Hands: Left  Permission Sought/Granted                  Emotional Assessment              Admission diagnosis:  Closed fracture of left hip, initial encounter (HCC) [S72.002A] Minor head injury, initial encounter [S09.90XA] Fall in home, initial encounter [W19.XXXA, Y92.009] Fx humeral neck, left, closed, initial encounter [S42.212A] S/p left hip fracture [Z87.81] Patient Active Problem List   Diagnosis Date Noted   Closed hip fracture requiring operative repair, left, sequela 03/09/2023   Humeral head fracture, left, sequela 03/09/2023   GERD (gastroesophageal reflux disease) 03/09/2023   Dementia with behavioral disturbance (HCC) 03/09/2023   Postoperative anemia 03/09/2023   Fx humeral neck, left, closed, initial encounter  01/27/2023   SIRS (systemic inflammatory response syndrome) (HCC) 01/27/2023   Hypertension 01/27/2023   Hypothyroidism 01/27/2023   RLS (restless legs syndrome) 01/27/2023   Chronic diarrhea 01/27/2023   Fall 01/27/2023   Bronchitis 01/27/2023   Closed fracture of left proximal humerus 01/27/2023   Severe sepsis (HCC) 01/27/2023   Acquired hypothyroidism 06/13/2015   PCP:  Marguarite Arbour, MD Pharmacy:   [facility] #[id number] [facility] [facility], [place] - [address] AT [facility] [address] [place] [address] Phone: [phone] Fax:  [phone]     Social Determinants of Health (SDOH) Social History: SDOH Screenings   Food Insecurity: No Food Insecurity (03/08/2023)  Housing: Patient Declined (03/08/2023)  Transportation Needs: No Transportation Needs (03/08/2023)  Utilities: Not At Risk (03/08/2023)  Tobacco Use: Medium Risk (03/08/2023)   SDOH Interventions:     Readmission Risk Interventions     No data to display

## 2023-03-09 NOTE — Hospital Course (Signed)
77 y.o. female with medical history significant of GERD, Hypothyroidism,  HLD, HTN, SVT, gait instability with falls , RLS,  hx of Benign positional vertigo with nystagmus, tremor of left hand and left hemibody  bradykinesia who presents to ED s/p mechanical fall  after attempting to pick up item off floor.  Patient states she fell on her left side and noted left arm and hip pain s/p fall. She notes prior to fall she was in her normal state of health. She denies any chest pain , sob/ fever/chills/ cough / n/v/d/ or dysuria.  She notes she only has pain with movement in regards to her arm and left hip. She also has interim history of fall one month ago on her left were she sustained fx of left humeral neck.  5/25.  Patient seen while sitting in chair.  Stated she walked well with physical therapy but then told me that her memory is not that good.  Physical therapy also confirmed that she walked 20 feet and then another 40 feet.  Plan will be to go home with home health. 5/26.  Patient with audible wheeze this morning.  Ordered nebulizer treatments.  Chest x-ray negative for pneumonia.  Upon reevaluation with the patient's daughter-in-law, patient's daughter-in-law states that she does have wheeze at times.  After coughing she was able to clear the wheeze.  Will prescribe albuterol inhaler upon going home.  Orthopedic surgery prescribed pain medications and Lovenox injections.

## 2023-03-09 NOTE — Assessment & Plan Note (Signed)
On PPI

## 2023-03-09 NOTE — Assessment & Plan Note (Addendum)
Follows as outpatient with Dr. Sherryll Burger neurology.

## 2023-03-09 NOTE — Assessment & Plan Note (Signed)
On Cardizem and losartan

## 2023-03-09 NOTE — Assessment & Plan Note (Signed)
Left arm immobilizer placed by orthopedics.

## 2023-03-10 ENCOUNTER — Inpatient Hospital Stay: Payer: Medicare HMO

## 2023-03-10 DIAGNOSIS — E038 Other specified hypothyroidism: Secondary | ICD-10-CM

## 2023-03-10 DIAGNOSIS — R062 Wheezing: Secondary | ICD-10-CM

## 2023-03-10 DIAGNOSIS — D649 Anemia, unspecified: Secondary | ICD-10-CM

## 2023-03-10 DIAGNOSIS — E559 Vitamin D deficiency, unspecified: Secondary | ICD-10-CM | POA: Insufficient documentation

## 2023-03-10 LAB — CBC
HCT: 32.2 % — ABNORMAL LOW (ref 36.0–46.0)
Hemoglobin: 10 g/dL — ABNORMAL LOW (ref 12.0–15.0)
MCH: 24.5 pg — ABNORMAL LOW (ref 26.0–34.0)
MCHC: 31.1 g/dL (ref 30.0–36.0)
MCV: 78.9 fL — ABNORMAL LOW (ref 80.0–100.0)
Platelets: 265 10*3/uL (ref 150–400)
RBC: 4.08 MIL/uL (ref 3.87–5.11)
RDW: 14.7 % (ref 11.5–15.5)
WBC: 12.2 10*3/uL — ABNORMAL HIGH (ref 4.0–10.5)
nRBC: 0 % (ref 0.0–0.2)

## 2023-03-10 LAB — URINALYSIS, W/ REFLEX TO CULTURE (INFECTION SUSPECTED)
Bacteria, UA: NONE SEEN
Bilirubin Urine: NEGATIVE
Glucose, UA: NEGATIVE mg/dL
Hgb urine dipstick: NEGATIVE
Ketones, ur: NEGATIVE mg/dL
Leukocytes,Ua: NEGATIVE
Nitrite: NEGATIVE
Protein, ur: NEGATIVE mg/dL
Specific Gravity, Urine: 1.006 (ref 1.005–1.030)
pH: 6 (ref 5.0–8.0)

## 2023-03-10 MED ORDER — ALENDRONATE SODIUM 70 MG PO TABS: 70.0000 mg | ORAL_TABLET | ORAL | 0 refills | Status: DC

## 2023-03-10 MED ORDER — IPRATROPIUM-ALBUTEROL 0.5-2.5 (3) MG/3ML IN SOLN
3.0000 mL | Freq: Four times a day (QID) | RESPIRATORY_TRACT | Status: DC
  Administered 2023-03-10: 3 mL via RESPIRATORY_TRACT
  Filled 2023-03-10: qty 3

## 2023-03-10 MED ORDER — ACETAMINOPHEN 500 MG PO TABS: 500.0000 mg | ORAL_TABLET | Freq: Four times a day (QID) | ORAL | 0 refills | Status: DC | PRN

## 2023-03-10 MED ORDER — HYDROCODONE-ACETAMINOPHEN 5-325 MG PO TABS: 0.5000 | ORAL_TABLET | Freq: Three times a day (TID) | ORAL | 0 refills | Status: DC | PRN

## 2023-03-10 MED ORDER — TRAMADOL HCL 50 MG PO TABS: 50.0000 mg | ORAL_TABLET | Freq: Four times a day (QID) | ORAL | Status: DC | PRN

## 2023-03-10 MED ORDER — HYDROCODONE-ACETAMINOPHEN 5-325 MG PO TABS
0.5000 | ORAL_TABLET | Freq: Three times a day (TID) | ORAL | Status: DC | PRN
  Administered 2023-03-10: 1 via ORAL
  Filled 2023-03-10: qty 1

## 2023-03-10 MED ORDER — ALBUTEROL SULFATE HFA 108 (90 BASE) MCG/ACT IN AERS: 2.0000 | INHALATION_SPRAY | Freq: Four times a day (QID) | RESPIRATORY_TRACT | 0 refills | Status: DC | PRN

## 2023-03-10 MED ORDER — CALCIUM CARBONATE 600 MG PO TABS: 600.0000 mg | ORAL_TABLET | Freq: Two times a day (BID) | ORAL | 0 refills | Status: DC

## 2023-03-10 MED ORDER — ACETAMINOPHEN 500 MG PO TABS: 500.0000 mg | ORAL_TABLET | Freq: Four times a day (QID) | ORAL | Status: DC | PRN

## 2023-03-10 MED ORDER — BUDESONIDE 0.25 MG/2ML IN SUSP
0.2500 mg | Freq: Two times a day (BID) | RESPIRATORY_TRACT | Status: DC
  Administered 2023-03-10: 0.25 mg via RESPIRATORY_TRACT
  Filled 2023-03-10: qty 2

## 2023-03-10 MED ORDER — ENOXAPARIN SODIUM 40 MG/0.4ML IJ SOSY: 40.0000 mg | PREFILLED_SYRINGE | INTRAMUSCULAR | 0 refills | Status: DC

## 2023-03-10 NOTE — Discharge Summary (Signed)
Physician Discharge Summary   Patient: Charlene Thomas MRN: [id number] DOB: [birth year]  Admit date:     03/08/2023  Discharge date: 03/10/23  Discharge Physician: Alford Highland   PCP: Marguarite Arbour, MD   Recommendations at discharge:   Follow-up with PCP 5 days Follow-up with orthopedic surgery on 6/7 or later for postoperative wound care from surgery on 5/24.  Discharge Diagnoses: Principal Problem:   Closed hip fracture requiring operative repair, left, sequela Active Problems:   Wheeze   Humeral head fracture, left, sequela   Dementia with behavioral disturbance (HCC)   Hypertension   Hypothyroidism   GERD (gastroesophageal reflux disease)   Postoperative anemia   Vitamin D deficiency    Hospital Course: 77 y.o. female with medical history significant of GERD, Hypothyroidism,  HLD, HTN, SVT, gait instability with falls , RLS,  hx of Benign positional vertigo with nystagmus, tremor of left hand and left hemibody  bradykinesia who presents to ED s/p mechanical fall  after attempting to pick up item off floor.  Patient states she fell on her left side and noted left arm and hip pain s/p fall. She notes prior to fall she was in her normal state of health. She denies any chest pain , sob/ fever/chills/ cough / n/v/d/ or dysuria.  She notes she only has pain with movement in regards to her arm and left hip. She also has interim history of fall one month ago on her left were she sustained fx of left humeral neck.  5/25.  Patient seen while sitting in chair.  Stated she walked well with physical therapy but then told me that her memory is not that good.  Physical therapy also confirmed that she walked 20 feet and then another 40 feet.  Plan will be to go home with home health. 5/26.  Patient with audible wheeze this morning.  Ordered nebulizer treatments.  Chest x-ray negative for pneumonia.  Upon reevaluation with the patient's daughter-in-law, patient's daughter-in-law states  that she does have wheeze at times.  After coughing she was able to clear the wheeze.  Will prescribe albuterol inhaler upon going home.  Orthopedic surgery prescribed pain medications and Lovenox injections.  Assessment and Plan: * Closed hip fracture requiring operative repair, left, sequela Patient had an in situ cannulated screw fixation of valgus impacted left femoral neck fracture done by Dr. Joice Lofts on 5/24.  Did very well with physical therapy they recommended home with home health.  Follow-up with orthopedic surgery as outpatient for wound care.  Orthopedic surgery prescribed Lovenox injections and pain medications.  Patient already on high-dose vitamin D will add calcium and Fosamax (Fosamax may need authorization)  Wheeze Better after patient coughed and cleared upper airway.  I also gave nebulizers this morning.  Will prescribe albuterol inhaler upon going home.  Chest x-ray negative.  Dementia with behavioral disturbance (HCC) Follows as outpatient with Dr. Sherryll Burger neurology.  Heavy metal blood test pending.  The patient did not sleep here in the hospital last night.  Patient's daughter-in-law states that she will sleep better at home.  Humeral head fracture, left, sequela Left arm immobilizer placed by orthopedics.  Hypertension On Cardizem and losartan  Hypothyroidism On Synthroid  GERD (gastroesophageal reflux disease) On PPI  Vitamin D deficiency Patient takes high-dose vitamin D every Sunday.  Postoperative anemia Today's hemoglobin 10.0.  Follow-up as outpatient.         Consultants: Orthopedic surgery Procedures performed: In situ cannulated screw fixation of valgus impacted  left femoral neck fracture Disposition: Home health Diet recommendation:  Cardiac diet DISCHARGE MEDICATION: Allergies as of 03/10/2023       Reactions   Aspirin    Celecoxib Other (See Comments)   Gi upset   Ezetimibe-simvastatin    Other Reaction(s): Muscle Pain   Mobic  [meloxicam] Itching   Raloxifene    Other Reaction(s): Unknown Upset gi   Topiramate Other (See Comments)   Causes severe confusion        Medication List     TAKE these medications    acetaminophen 500 MG tablet Commonly known as: TYLENOL Take 1-2 tablets (500-1,000 mg total) by mouth every 6 (six) hours as needed for mild pain.   albuterol 108 (90 Base) MCG/ACT inhaler Commonly known as: VENTOLIN HFA Inhale 2 puffs into the lungs every 6 (six) hours as needed for wheezing or shortness of breath.   alendronate 70 MG tablet Commonly known as: Fosamax Take 1 tablet (70 mg total) by mouth once a week. Take with a full glass of water on an empty stomach.   calcium carbonate 600 MG Tabs tablet Commonly known as: Calcium 600 Take 1 tablet (600 mg total) by mouth 2 (two) times daily with a meal.   cetirizine 10 MG tablet Commonly known as: ZYRTEC Take 10 mg by mouth daily.   diltiazem 240 MG 24 hr capsule Commonly known as: CARDIZEM CD Take 240 mg by mouth daily.   DULoxetine 30 MG capsule Commonly known as: CYMBALTA Take 30 mg by mouth daily.   enoxaparin 40 MG/0.4ML injection Commonly known as: LOVENOX Inject 0.4 mLs (40 mg total) into the skin daily.   HYDROcodone-acetaminophen 5-325 MG tablet Commonly known as: NORCO/VICODIN Take 0.5-1 tablets by mouth every 8 (eight) hours as needed for severe pain.   ibuprofen 400 MG tablet Commonly known as: ADVIL Take 400 mg by mouth every 6 (six) hours as needed.   levothyroxine 125 MCG tablet Commonly known as: SYNTHROID Take 125 mcg by mouth daily.   losartan 100 MG tablet Commonly known as: COZAAR Take 100 mg by mouth daily.   metoprolol tartrate 25 MG tablet Commonly known as: LOPRESSOR Take 25 mg by mouth 2 (two) times daily.   omeprazole 20 MG capsule Commonly known as: PRILOSEC Take 20 mg by mouth 2 (two) times daily.   pravastatin 40 MG tablet Commonly known as: PRAVACHOL Take 1 tablet (40 mg total)  by mouth daily.   Vitamin D (Ergocalciferol) 1.25 MG (50000 UNIT) Caps capsule Commonly known as: DRISDOL Take 50,000 Units by mouth once a week.               Durable Medical Equipment  (From admission, onward)           Start     Ordered   03/09/23 1159  For home use only DME Other see comment  Once       Comments: Hemi walker  Question:  Length of Need  Answer:  6 Months   03/09/23 1158            Follow-up Information     Marguarite Arbour, MD Follow up in 5 day(s).   Specialty: Internal Medicine Contact information: [address] [place] [address] [phone]         Evon Slack, PA-C Follow up in 12 day(s).   Specialties: Orthopedic Surgery, Emergency Medicine Contact information: [address] [place] [address] [phone]  Discharge Exam: Filed Weights   03/08/23 1322  Weight: 72.6 kg   Physical Exam HENT:     Head: Normocephalic.     Mouth/Throat:     Pharynx: No oropharyngeal exudate.  Eyes:     General: Lids are normal.     Conjunctiva/sclera: Conjunctivae normal.  Cardiovascular:     Rate and Rhythm: Normal rate and regular rhythm.     Heart sounds: Normal heart sounds, S1 normal and S2 normal.  Pulmonary:     Breath sounds: No decreased breath sounds, wheezing, rhonchi or rales.  Abdominal:     Palpations: Abdomen is soft.     Tenderness: There is no abdominal tenderness.  Musculoskeletal:     Right lower leg: No swelling.     Left lower leg: No swelling.  Skin:    General: Skin is warm.     Findings: No rash.  Neurological:     Mental Status: She is alert.     Comments: Answers questions appropriately.  Able to straight leg raise with right leg but unable to do it with the left.     Condition at discharge: stable  The results of significant diagnostics from this hospitalization (including imaging, microbiology, ancillary and laboratory) are listed below for reference.    Imaging Studies: DG Chest Port 1 View  Result Date: 03/10/2023 CLINICAL DATA:  New onset of wheezing. Recent hip surgery. EXAM: PORTABLE CHEST 1 VIEW COMPARISON:  Chest radiograph 01/27/2023 FINDINGS: Stable heart size and mediastinal contours. There is mild aortic tortuosity. Minor bronchial thickening without focal airspace disease. No pleural effusion. No pneumothorax. No features of pulmonary edema. Chronic left shoulder arthropathy. IMPRESSION: Minor bronchial thickening without focal airspace disease. Electronically Signed   By: Narda Rutherford M.D.   On: 03/10/2023 13:55   DG HIP UNILAT WITH PELVIS 2-3 VIEWS LEFT  Result Date: 03/08/2023 CLINICAL DATA:  Left hip pinning. EXAM: DG HIP (WITH OR WITHOUT PELVIS) 2-3V LEFT COMPARISON:  Preoperative imaging. FINDINGS: Three fluoroscopic spot views of the left hip obtained in the operating room. Three screws traverse left femoral neck fracture. Fluoroscopy time 44 seconds, dose 5.9969 mGy. IMPRESSION: Intraoperative fluoroscopy during pinning of left femoral neck fracture. Electronically Signed   By: Narda Rutherford M.D.   On: 03/08/2023 18:33   DG C-Arm 1-60 Min-No Report  Result Date: 03/08/2023 Fluoroscopy was utilized by the requesting physician.  No radiographic interpretation.   CT HEAD WO CONTRAST ( )  Result Date: 03/08/2023 CLINICAL DATA:  Trauma/fall EXAM: CT HEAD WITHOUT CONTRAST CT CERVICAL SPINE WITHOUT CONTRAST TECHNIQUE: Multidetector CT imaging of the head and cervical spine was performed following the standard protocol without intravenous contrast. Multiplanar CT image reconstructions of the cervical spine were also generated. RADIATION DOSE REDUCTION: This exam was performed according to the departmental dose-optimization program which includes automated exposure control, adjustment of the mA and/or kV according to patient size and/or use of iterative reconstruction technique. COMPARISON:  MRI brain dated 02/21/2023. CT  head/cervical spine dated 01/26/2023. FINDINGS: CT HEAD FINDINGS Brain: No evidence of acute infarction, hemorrhage, hydrocephalus, extra-axial collection or mass lesion/mass effect. Subcortical white matter and periventricular small vessel ischemic changes. Vascular: Intracranial atherosclerosis. Skull: Normal. Negative for fracture or focal lesion. Sinuses/Orbits: Partial opacification of the left mastoid air cells. Visualized paranasal sinuses otherwise clear. Other: None. CT CERVICAL SPINE FINDINGS Alignment: Normal cervical lordosis. Skull base and vertebrae: No acute fracture. No primary bone lesion or focal pathologic process. Soft tissues and spinal canal: No prevertebral fluid or  swelling. No visible canal hematoma. Disc levels: Mild degenerative changes of the mid cervical spine. Spinal canal is patent. Upper chest: Visualized lung apices are clear. Other: None. IMPRESSION: No acute intracranial abnormality. Small vessel ischemic changes. No traumatic injury to the cervical spine. Mild degenerative changes. Electronically Signed   By: Charline Bills M.D.   On: 03/08/2023 14:52   CT Cervical Spine Wo Contrast  Result Date: 03/08/2023 CLINICAL DATA:  Trauma/fall EXAM: CT HEAD WITHOUT CONTRAST CT CERVICAL SPINE WITHOUT CONTRAST TECHNIQUE: Multidetector CT imaging of the head and cervical spine was performed following the standard protocol without intravenous contrast. Multiplanar CT image reconstructions of the cervical spine were also generated. RADIATION DOSE REDUCTION: This exam was performed according to the departmental dose-optimization program which includes automated exposure control, adjustment of the mA and/or kV according to patient size and/or use of iterative reconstruction technique. COMPARISON:  MRI brain dated 02/21/2023. CT head/cervical spine dated 01/26/2023. FINDINGS: CT HEAD FINDINGS Brain: No evidence of acute infarction, hemorrhage, hydrocephalus, extra-axial collection or mass  lesion/mass effect. Subcortical white matter and periventricular small vessel ischemic changes. Vascular: Intracranial atherosclerosis. Skull: Normal. Negative for fracture or focal lesion. Sinuses/Orbits: Partial opacification of the left mastoid air cells. Visualized paranasal sinuses otherwise clear. Other: None. CT CERVICAL SPINE FINDINGS Alignment: Normal cervical lordosis. Skull base and vertebrae: No acute fracture. No primary bone lesion or focal pathologic process. Soft tissues and spinal canal: No prevertebral fluid or swelling. No visible canal hematoma. Disc levels: Mild degenerative changes of the mid cervical spine. Spinal canal is patent. Upper chest: Visualized lung apices are clear. Other: None. IMPRESSION: No acute intracranial abnormality. Small vessel ischemic changes. No traumatic injury to the cervical spine. Mild degenerative changes. Electronically Signed   By: Charline Bills M.D.   On: 03/08/2023 14:52   DG Shoulder Left  Result Date: 03/08/2023 CLINICAL DATA:  Trauma, fall EXAM: LEFT SHOULDER - 2+ VIEW COMPARISON:  01/26/2023 FINDINGS: There is comminuted impacted fracture in the neck of left humerus. There is 7 mm offset in alignment of cortical margins along the lateral aspect. This finding was not distinctly seen in the previous examination. Possible soft tissue calcifications are noted adjacent to the fracture site. There is no dislocation. Osteopenia is seen in bony structures. IMPRESSION: There is comminuted fracture in the neck of left humerus. There is offset in alignment of cortical margins and possible impaction at the fracture site. There is no dislocation. Electronically Signed   By: Ernie Avena M.D.   On: 03/08/2023 14:28   DG Hip Unilat W or Wo Pelvis 2-3 Views Left  Result Date: 03/08/2023 CLINICAL DATA:  Trauma, fall EXAM: DG HIP (WITH OR WITHOUT PELVIS) 2-3V LEFT COMPARISON:  None FINDINGS: There is a recent comminuted fracture in the neck of left femur.  There is 4 mm offset in alignment of the fracture fragments. There is shortening of left femoral neck suggesting impaction at the fracture site. Rest of the bony structures show no fractures. Degenerative changes are noted in the visualized lower lumbar spine. IMPRESSION: Recent comminuted impacted fracture is seen in the neck of left femur. Electronically Signed   By: Ernie Avena M.D.   On: 03/08/2023 14:22   MR BRAIN W WO CONTRAST  Result Date: 02/21/2023 CLINICAL DATA:  Provided history: Memory loss. Confusion. Additional history provided: Recent left shoulder fracture. EXAM: MRI HEAD WITHOUT AND WITH CONTRAST TECHNIQUE: Multiplanar, multiecho pulse sequences of the brain and surrounding structures were obtained without and with intravenous contrast.  CONTRAST:  7.5 mL Vueway intravenous contrast. COMPARISON:  Head CT 01/26/2023.  Brain MRI 01/18/2014. FINDINGS: Brain: Mild-to-moderate cerebral atrophy without definite lobar predominance. Patchy and confluent T2 FLAIR hyperintense signal abnormality within the cerebral white matter, nonspecific but compatible with chronic small vessel disease. Chronic microhemorrhage within the left basal ganglia/internal capsule. There is no acute infarct. No evidence of an intracranial mass. No extra-axial fluid collection. No midline shift. No pathologic intracranial enhancement identified. Vascular: Maintained flow voids within the proximal large arterial vessels. Right frontal lobe developmental venous anomaly (an anatomic variant). Skull and upper cervical spine: No focal suspicious marrow lesion. Incompletely assessed cervical spondylosis. Sinuses/Orbits: No mass or acute finding within the imaged orbits. Prior but ocular lens replacement. Small fluid level within the left sphenoid sinus. Other: Small-volume fluid within the bilateral mastoid air cells. IMPRESSION: 1.  No evidence of an acute intracranial abnormality. 2. Advanced chronic small vessel ischemic  changes within the cerebral white matter, significantly progressed from the prior brain MRI of 01/18/2014. 3. Mild-to-moderate cerebral atrophy. 4. Left sphenoid sinusitis. 5. Small-volume fluid within the bilateral mastoid air cells. Electronically Signed   By: Jackey Loge D.O.   On: 02/21/2023 16:39    Microbiology: Results for orders placed or performed during the hospital encounter of 01/26/23  Blood Culture (routine x 2)     Status: None   Collection Time: 01/27/23  2:34 AM   Specimen: BLOOD  Result Value Ref Range Status   Specimen Description BLOOD LEFT ANTECUBITAL  Final   Special Requests   Final    BOTTLES DRAWN AEROBIC AND ANAEROBIC Blood Culture adequate volume   Culture   Final    NO GROWTH 5 DAYS Performed at [facility], [address], [place], [place] [address]    Report Status 02/01/2023 FINAL  Final  Blood Culture (routine x 2)     Status: None   Collection Time: 01/27/23  2:34 AM   Specimen: BLOOD  Result Value Ref Range Status   Specimen Description BLOOD RIGHT ANTECUBITAL  Final   Special Requests   Final    BOTTLES DRAWN AEROBIC AND ANAEROBIC Blood Culture adequate volume   Culture   Final    NO GROWTH 5 DAYS Performed at [facility], [address] [place], [place] [address]    Report Status 02/01/2023 FINAL  Final  Resp panel by RT-PCR (RSV, Flu A&B, Covid) Anterior Nasal Swab     Status: None   Collection Time: 01/27/23  5:56 AM   Specimen: Anterior Nasal Swab  Result Value Ref Range Status   SARS Coronavirus 2 by RT PCR NEGATIVE NEGATIVE Final    Comment: (NOTE) SARS-CoV-2 target nucleic acids are NOT DETECTED.  The SARS-CoV-2 RNA is generally detectable in upper respiratory specimens during the acute phase of infection. The lowest concentration of SARS-CoV-2 viral copies this assay can detect is 138 copies/mL. A negative result does not preclude SARS-Cov-2 infection and should not be used as the sole basis for treatment  or other patient management decisions. A negative result may occur with  improper specimen collection/handling, submission of specimen other than nasopharyngeal swab, presence of viral mutation(s) within the areas targeted by this assay, and inadequate number of viral copies(<138 copies/mL). A negative result must be combined with clinical observations, patient history, and epidemiological information. The expected result is Negative.  Fact Sheet for Patients:  BloggerCourse.com  Fact Sheet for Healthcare Providers:  SeriousBroker.it  This test is no t yet approved or  cleared by the [place] and  has been authorized for detection and/or diagnosis of SARS-CoV-2 by FDA under an Emergency Use Authorization (EUA). This EUA will remain  in effect (meaning this test can be used) for the duration of the COVID-19 declaration under Section 564(b)(1) of the Act, 21 U.S.C.section 360bbb-3(b)(1), unless the authorization is terminated  or revoked sooner.       Influenza A by PCR NEGATIVE NEGATIVE Final   Influenza B by PCR NEGATIVE NEGATIVE Final    Comment: (NOTE) The Xpert Xpress SARS-CoV-2/FLU/RSV plus assay is intended as an aid in the diagnosis of influenza from Nasopharyngeal swab specimens and should not be used as a sole basis for treatment. Nasal washings and aspirates are unacceptable for Xpert Xpress SARS-CoV-2/FLU/RSV testing.  Fact Sheet for Patients: BloggerCourse.com  Fact Sheet for Healthcare Providers: SeriousBroker.it  This test is not yet approved or cleared by the [place] FDA and has been authorized for detection and/or diagnosis of SARS-CoV-2 by FDA under an Emergency Use Authorization (EUA). This EUA will remain in effect (meaning this test can be used) for the duration of the COVID-19 declaration under Section 564(b)(1) of the Act, 21 U.S.C. section  360bbb-3(b)(1), unless the authorization is terminated or revoked.     Resp Syncytial Virus by PCR NEGATIVE NEGATIVE Final    Comment: (NOTE) Fact Sheet for Patients: BloggerCourse.com  Fact Sheet for Healthcare Providers: SeriousBroker.it  This test is not yet approved or cleared by the [place] FDA and has been authorized for detection and/or diagnosis of SARS-CoV-2 by FDA under an Emergency Use Authorization (EUA). This EUA will remain in effect (meaning this test can be used) for the duration of the COVID-19 declaration under Section 564(b)(1) of the Act, 21 U.S.C. section 360bbb-3(b)(1), unless the authorization is terminated or revoked.  Performed at [facility], [address] [place], [place] [address]     Labs: CBC: Recent Labs  Lab 03/08/23 1531 03/09/23 0455 03/10/23 0503  WBC 8.0 5.4 12.2*  NEUTROABS 5.5  --   --   HGB 12.2 10.5* 10.0*  HCT 39.2 34.1* 32.2*  MCV 79.5* 79.1* 78.9*  PLT 249 217 265   Basic Metabolic Panel: Recent Labs  Lab 03/08/23 1531 03/09/23 0455  NA 135 137  K 3.8 3.9  CL 101 104  CO2 22 23  GLUCOSE 98 149*  BUN 12 12  CREATININE 0.78 0.82  CALCIUM 9.6 9.1   Liver Function Tests: No results for input(s): "AST", "ALT", "ALKPHOS", "BILITOT", "PROT", "ALBUMIN" in the last 168 hours. CBG: No results for input(s): "GLUCAP" in the last 168 hours.  Discharge time spent: greater than 30 minutes.  Signed: Alford Highland, MD [facility] 03/10/2023

## 2023-03-10 NOTE — Assessment & Plan Note (Addendum)
Better after patient coughed and cleared upper airway.  I also gave nebulizers this morning.  Will prescribe albuterol inhaler upon going home.  Chest x-ray negative.

## 2023-03-10 NOTE — Progress Notes (Addendum)
Subjective: 2 Days Post-Op Procedure(s) (LRB): Cannulated Screw Fixation of the Left Femoral Neck Fracture (Left) Left proximal humerus fracture, being treated non-op Patient reports pain as mild in the left hip and shoulder. Patient is well, does appear more confused compared to yesterday this AM. Patient performed very well with PT, currently plan is to return home with HHPT. Negative for chest pain and shortness of breath Fever: no Gastrointestinal:Negative for nausea and vomiting  Objective: Vital signs in last 24 hours: Temp:  [97.8 F (36.6 C)-98.5 F (36.9 C)] 98.2 F (36.8 C) (05/25 2315) Pulse Rate:  [72-105] 72 (05/25 2315) Resp:  [17-20] 20 (05/25 2315) BP: (108-143)/(63-78) 143/75 (05/25 2315) SpO2:  [94 %-97 %] 94 % (05/25 2315)  Intake/Output from previous day:  Intake/Output Summary (Last 24 hours) at 03/10/2023 0739 Last data filed at 03/10/2023 0506 Gross per 24 hour  Intake 360 ml  Output 500 ml  Net -140 ml    Intake/Output this shift: No intake/output data recorded.  Labs: Recent Labs    03/08/23 1531 03/09/23 0455 03/10/23 0503  HGB 12.2 10.5* 10.0*   Recent Labs    03/09/23 0455 03/10/23 0503  WBC 5.4 12.2*  RBC 4.31 4.08  HCT 34.1* 32.2*  PLT 217 265   Recent Labs    03/08/23 1531 03/09/23 0455  NA 135 137  K 3.8 3.9  CL 101 104  CO2 22 23  BUN 12 12  CREATININE 0.78 0.82  GLUCOSE 98 149*  CALCIUM 9.6 9.1   No results for input(s): "LABPT", "INR" in the last 72 hours.   EXAM General - Patient is alert, is confused this morning, has to be reminded about having surgery. Extremity - Left arm in shoulder immobilizer. Intact to light touch the left arm, able to flex and extend elbow and wrist without pain. Able to make a fist, radial pulse intact. Left thigh soft to palpation.  Incision site clean without any drainage noted. Negative Homans test, intact dorsalis pedis and posterior tibial pulse intact. Dressing/Incision -  clean, dry, no drainage to the left hip. Motor Function - intact, moving foot and toes well on exam.  Abdomen soft to palpation, intact bowel sounds.   Past Medical History:  Diagnosis Date   Anginal pain (HCC)    Arthritis    Dysrhythmia    TACHYCARDIA   GERD (gastroesophageal reflux disease)    Heart palpitations    Hypertension    Hypothyroidism     Assessment/Plan: 2 Days Post-Op Procedure(s) (LRB): Cannulated Screw Fixation of the Left Femoral Neck Fracture (Left) Principal Problem:   Closed hip fracture requiring operative repair, left, sequela Active Problems:   Hypertension   Hypothyroidism   Humeral head fracture, left, sequela   GERD (gastroesophageal reflux disease)   Dementia with behavioral disturbance (HCC)   Postoperative anemia  Estimated body mass index is 29.26 kg/m as calculated from the following:   Height as of this encounter: 5\' 2"  (1.575 m).   Weight as of this encounter: 72.6 kg. Advance diet Up with therapy D/C IV fluids when tolerating po intake.  Labs reviewed this AM, WBC up to 12.2. Patient more confused than yesterday, unsure if this is her baseline first thing in the morning as I saw her yesterday after her son had already arrived.  UA negative for obvious UTI. Up with therapy today, limit narcotics today due to confusion.  Will adjust currently ordered Norco dosage. Plan for possible d/c home today pending her progress with PT.  Plan is for d/c home with HHPT.  Following discharge, follow-up with [facility] orthopaedics in 10-14 days for staple removal and repeat x-rays.  DVT Prophylaxis - TED hose and Lovenox Weight-Bearing as tolerated to left leg, non-weightbearing to the left arm.  Valeria Batman, PA-C [facility] 03/10/2023, 7:39 AM

## 2023-03-10 NOTE — Assessment & Plan Note (Signed)
Patient takes high-dose vitamin D every Sunday.

## 2023-03-10 NOTE — Progress Notes (Signed)
Physical Therapy Treatment Patient Details Name: Charlene Thomas MRN: [id number] DOB: 1946/07/28 Today's Date: 03/10/2023   History of Present Illness Pt is a 77 y.o. female with medical history significant for HTN, hypothyroidism, chronic diarrhea following cholecystectomy several years ago who had a fall and suffered L hip fracture and reinjury of recent L humerous fracture.  Now s/p hip fixation 5/24.    PT Comments    Pt in bed, c/o increased pain today.  She is able to get to EOB with min a x 1.  Steady once sitting.  She is able to stand with cues and walk to [place] [facility] to void.  Able to do her own self care with set up.  She is able to walk just outside her door and back to chair with HW and occasional cues for placement and to pick up her feet.  Overall does well with minimal cues today.  Son is there and does cue her.  Remained in chair with needs met.  Son given gait belt for home use.  They do have a ramp to access home.   Recommendations for follow up therapy are one component of a multi-disciplinary discharge planning process, led by the attending physician.  Recommendations may be updated based on patient status, additional functional criteria and insurance authorization.  Follow Up Recommendations       Assistance Recommended at Discharge Frequent or constant Supervision/Assistance  Patient can return home with the following A little help with walking and/or transfers;A little help with bathing/dressing/bathroom;Assistance with cooking/housework;Help with stairs or ramp for entrance   Equipment Recommendations       Recommendations for Other Services       Precautions / Restrictions Precautions Precautions: Fall Required Braces or Orthoses: Sling Restrictions LUE Weight Bearing: Non weight bearing LLE Weight Bearing: Weight bearing as tolerated     Mobility  Bed Mobility Overal bed mobility: Needs Assistance Bed Mobility: Supine to Sit     Supine to sit: Min  assist       Patient Response: Cooperative  Transfers Overall transfer level: Needs assistance Equipment used: Hemi-walker Transfers: Sit to/from Stand Sit to Stand: Min guard, Min assist                Ambulation/Gait Ambulation/Gait assistance: Land (Feet): 40 Feet Assistive device: Hemi-walker Gait Pattern/deviations: Step-to pattern Gait velocity: decreased     General Gait Details: some increased difficulty an dlimited distance today due to pain but remains with only needing light assist/cues   Stairs             Wheelchair Mobility    Modified Rankin (Stroke Patients Only)       Balance Overall balance assessment: Needs assistance Sitting-balance support: Single extremity supported Sitting balance-Leahy Scale: Good     Standing balance support: Single extremity supported Standing balance-Leahy Scale: Good                              Cognition Arousal/Alertness: Awake/alert Behavior During Therapy: WFL for tasks assessed/performed Overall Cognitive Status: Within Functional Limits for tasks assessed                                 General Comments: at recent baseline with mild confusion but ability to follow basic cing        Exercises Other Exercises Other Exercises: supine AAROM LLE ,  to [facility] to void    General Comments        Pertinent Vitals/Pain Pain Assessment Pain Assessment: Faces Faces Pain Scale: Hurts whole lot Pain Location: does report increased pain last night and this morning limititng mobility  - L hip Pain Descriptors / Indicators: Sore Pain Intervention(s): Limited activity within patient's tolerance, Monitored during session, Premedicated before session, Repositioned    Home Living                          Prior Function            PT Goals (current goals can now be found in the care plan section) Progress towards PT goals: Progressing toward  goals    Frequency    7X/week      PT Plan Current plan remains appropriate    Co-evaluation              AM-PAC PT "6 Clicks" Mobility   Outcome Measure  Help needed turning from your back to your side while in a flat bed without using bedrails?: A Little Help needed moving from lying on your back to sitting on the side of a flat bed without using bedrails?: A Little Help needed moving to and from a bed to a chair (including a wheelchair)?: A Little Help needed standing up from a chair using your arms (e.g., wheelchair or bedside chair)?: A Little Help needed to walk in hospital room?: A Little Help needed climbing 3-5 steps with a railing? : A Lot 6 Click Score: 17    End of Session Equipment Utilized During Treatment: Gait belt Activity Tolerance: Patient tolerated treatment well;Patient limited by fatigue Patient left: in chair;with call bell/phone within reach;with chair alarm set;with family/visitor present Nurse Communication: Mobility status PT Visit Diagnosis: Muscle weakness (generalized) (M62.81);Difficulty in walking, not elsewhere classified (R26.2);Pain Pain - Right/Left: Left Pain - part of body: Hip;Shoulder     Time: 1610-9604 PT Time Calculation (min) (ACUTE ONLY): 20 min  Charges:  $Gait Training: 8-22 mins                   Danielle Dess, PTA 03/10/23, 10:25 AM

## 2023-03-13 ENCOUNTER — Encounter: Payer: Self-pay | Admitting: Surgery

## 2023-03-13 LAB — HEAVY METALS, BLOOD
Arsenic: 1 ug/L (ref 0–9)
Lead: <1 ug/dL (ref 0.0–3.4)
Mercury: <1 ug/L (ref 0.0–14.9)

## 2023-03-14 DIAGNOSIS — K219 Gastro-esophageal reflux disease without esophagitis: Secondary | ICD-10-CM | POA: Diagnosis not present

## 2023-03-14 DIAGNOSIS — I499 Cardiac arrhythmia, unspecified: Secondary | ICD-10-CM | POA: Diagnosis not present

## 2023-03-14 DIAGNOSIS — R002 Palpitations: Secondary | ICD-10-CM | POA: Diagnosis not present

## 2023-03-14 DIAGNOSIS — M47819 Spondylosis without myelopathy or radiculopathy, site unspecified: Secondary | ICD-10-CM | POA: Diagnosis not present

## 2023-03-14 DIAGNOSIS — I10 Essential (primary) hypertension: Secondary | ICD-10-CM | POA: Diagnosis not present

## 2023-03-14 DIAGNOSIS — R197 Diarrhea, unspecified: Secondary | ICD-10-CM | POA: Diagnosis not present

## 2023-03-14 DIAGNOSIS — J40 Bronchitis, not specified as acute or chronic: Secondary | ICD-10-CM | POA: Diagnosis not present

## 2023-03-14 DIAGNOSIS — S42292D Other displaced fracture of upper end of left humerus, subsequent encounter for fracture with routine healing: Secondary | ICD-10-CM | POA: Diagnosis not present

## 2023-03-14 DIAGNOSIS — E039 Hypothyroidism, unspecified: Secondary | ICD-10-CM | POA: Diagnosis not present

## 2023-03-15 DIAGNOSIS — J40 Bronchitis, not specified as acute or chronic: Secondary | ICD-10-CM | POA: Diagnosis not present

## 2023-03-15 DIAGNOSIS — I10 Essential (primary) hypertension: Secondary | ICD-10-CM | POA: Diagnosis not present

## 2023-03-15 DIAGNOSIS — M47819 Spondylosis without myelopathy or radiculopathy, site unspecified: Secondary | ICD-10-CM | POA: Diagnosis not present

## 2023-03-15 DIAGNOSIS — K219 Gastro-esophageal reflux disease without esophagitis: Secondary | ICD-10-CM | POA: Diagnosis not present

## 2023-03-15 DIAGNOSIS — I499 Cardiac arrhythmia, unspecified: Secondary | ICD-10-CM | POA: Diagnosis not present

## 2023-03-15 DIAGNOSIS — R002 Palpitations: Secondary | ICD-10-CM | POA: Diagnosis not present

## 2023-03-15 DIAGNOSIS — S42292D Other displaced fracture of upper end of left humerus, subsequent encounter for fracture with routine healing: Secondary | ICD-10-CM | POA: Diagnosis not present

## 2023-03-15 DIAGNOSIS — R197 Diarrhea, unspecified: Secondary | ICD-10-CM | POA: Diagnosis not present

## 2023-03-15 DIAGNOSIS — E039 Hypothyroidism, unspecified: Secondary | ICD-10-CM | POA: Diagnosis not present

## 2023-03-18 DIAGNOSIS — R002 Palpitations: Secondary | ICD-10-CM | POA: Diagnosis not present

## 2023-03-18 DIAGNOSIS — I499 Cardiac arrhythmia, unspecified: Secondary | ICD-10-CM | POA: Diagnosis not present

## 2023-03-18 DIAGNOSIS — J40 Bronchitis, not specified as acute or chronic: Secondary | ICD-10-CM | POA: Diagnosis not present

## 2023-03-18 DIAGNOSIS — R197 Diarrhea, unspecified: Secondary | ICD-10-CM | POA: Diagnosis not present

## 2023-03-18 DIAGNOSIS — M47819 Spondylosis without myelopathy or radiculopathy, site unspecified: Secondary | ICD-10-CM | POA: Diagnosis not present

## 2023-03-18 DIAGNOSIS — I10 Essential (primary) hypertension: Secondary | ICD-10-CM | POA: Diagnosis not present

## 2023-03-18 DIAGNOSIS — S42292D Other displaced fracture of upper end of left humerus, subsequent encounter for fracture with routine healing: Secondary | ICD-10-CM | POA: Diagnosis not present

## 2023-03-18 DIAGNOSIS — E039 Hypothyroidism, unspecified: Secondary | ICD-10-CM | POA: Diagnosis not present

## 2023-03-18 DIAGNOSIS — K219 Gastro-esophageal reflux disease without esophagitis: Secondary | ICD-10-CM | POA: Diagnosis not present

## 2023-03-19 DIAGNOSIS — E039 Hypothyroidism, unspecified: Secondary | ICD-10-CM | POA: Diagnosis not present

## 2023-03-19 DIAGNOSIS — I10 Essential (primary) hypertension: Secondary | ICD-10-CM | POA: Diagnosis not present

## 2023-03-19 DIAGNOSIS — I499 Cardiac arrhythmia, unspecified: Secondary | ICD-10-CM | POA: Diagnosis not present

## 2023-03-19 DIAGNOSIS — R002 Palpitations: Secondary | ICD-10-CM | POA: Diagnosis not present

## 2023-03-19 DIAGNOSIS — K219 Gastro-esophageal reflux disease without esophagitis: Secondary | ICD-10-CM | POA: Diagnosis not present

## 2023-03-19 DIAGNOSIS — R197 Diarrhea, unspecified: Secondary | ICD-10-CM | POA: Diagnosis not present

## 2023-03-19 DIAGNOSIS — J40 Bronchitis, not specified as acute or chronic: Secondary | ICD-10-CM | POA: Diagnosis not present

## 2023-03-19 DIAGNOSIS — M47819 Spondylosis without myelopathy or radiculopathy, site unspecified: Secondary | ICD-10-CM | POA: Diagnosis not present

## 2023-03-19 DIAGNOSIS — S42292D Other displaced fracture of upper end of left humerus, subsequent encounter for fracture with routine healing: Secondary | ICD-10-CM | POA: Diagnosis not present

## 2023-03-20 DIAGNOSIS — M47819 Spondylosis without myelopathy or radiculopathy, site unspecified: Secondary | ICD-10-CM | POA: Diagnosis not present

## 2023-03-20 DIAGNOSIS — I499 Cardiac arrhythmia, unspecified: Secondary | ICD-10-CM | POA: Diagnosis not present

## 2023-03-20 DIAGNOSIS — R197 Diarrhea, unspecified: Secondary | ICD-10-CM | POA: Diagnosis not present

## 2023-03-20 DIAGNOSIS — Z9889 Other specified postprocedural states: Secondary | ICD-10-CM | POA: Diagnosis not present

## 2023-03-20 DIAGNOSIS — K219 Gastro-esophageal reflux disease without esophagitis: Secondary | ICD-10-CM | POA: Diagnosis not present

## 2023-03-20 DIAGNOSIS — R002 Palpitations: Secondary | ICD-10-CM | POA: Diagnosis not present

## 2023-03-20 DIAGNOSIS — I10 Essential (primary) hypertension: Secondary | ICD-10-CM | POA: Diagnosis not present

## 2023-03-20 DIAGNOSIS — R296 Repeated falls: Secondary | ICD-10-CM | POA: Diagnosis not present

## 2023-03-20 DIAGNOSIS — S72012D Unspecified intracapsular fracture of left femur, subsequent encounter for closed fracture with routine healing: Secondary | ICD-10-CM | POA: Diagnosis not present

## 2023-03-20 DIAGNOSIS — F0390 Unspecified dementia without behavioral disturbance: Secondary | ICD-10-CM | POA: Diagnosis not present

## 2023-03-20 DIAGNOSIS — S42202D Unspecified fracture of upper end of left humerus, subsequent encounter for fracture with routine healing: Secondary | ICD-10-CM | POA: Diagnosis not present

## 2023-03-20 DIAGNOSIS — Z8781 Personal history of (healed) traumatic fracture: Secondary | ICD-10-CM | POA: Diagnosis not present

## 2023-03-20 DIAGNOSIS — S42292D Other displaced fracture of upper end of left humerus, subsequent encounter for fracture with routine healing: Secondary | ICD-10-CM | POA: Diagnosis not present

## 2023-03-20 DIAGNOSIS — Z79899 Other long term (current) drug therapy: Secondary | ICD-10-CM | POA: Diagnosis not present

## 2023-03-20 DIAGNOSIS — E039 Hypothyroidism, unspecified: Secondary | ICD-10-CM | POA: Diagnosis not present

## 2023-03-20 DIAGNOSIS — J40 Bronchitis, not specified as acute or chronic: Secondary | ICD-10-CM | POA: Diagnosis not present

## 2023-03-21 ENCOUNTER — Encounter: Payer: Self-pay | Admitting: Neurology

## 2023-03-21 DIAGNOSIS — K219 Gastro-esophageal reflux disease without esophagitis: Secondary | ICD-10-CM | POA: Diagnosis not present

## 2023-03-21 DIAGNOSIS — J40 Bronchitis, not specified as acute or chronic: Secondary | ICD-10-CM | POA: Diagnosis not present

## 2023-03-21 DIAGNOSIS — I499 Cardiac arrhythmia, unspecified: Secondary | ICD-10-CM | POA: Diagnosis not present

## 2023-03-21 DIAGNOSIS — R197 Diarrhea, unspecified: Secondary | ICD-10-CM | POA: Diagnosis not present

## 2023-03-21 DIAGNOSIS — I10 Essential (primary) hypertension: Secondary | ICD-10-CM | POA: Diagnosis not present

## 2023-03-21 DIAGNOSIS — S42292D Other displaced fracture of upper end of left humerus, subsequent encounter for fracture with routine healing: Secondary | ICD-10-CM | POA: Diagnosis not present

## 2023-03-21 DIAGNOSIS — R002 Palpitations: Secondary | ICD-10-CM | POA: Diagnosis not present

## 2023-03-21 DIAGNOSIS — M47819 Spondylosis without myelopathy or radiculopathy, site unspecified: Secondary | ICD-10-CM | POA: Diagnosis not present

## 2023-03-21 DIAGNOSIS — E039 Hypothyroidism, unspecified: Secondary | ICD-10-CM | POA: Diagnosis not present

## 2023-03-22 DIAGNOSIS — R4182 Altered mental status, unspecified: Secondary | ICD-10-CM | POA: Diagnosis not present

## 2023-03-25 DIAGNOSIS — K219 Gastro-esophageal reflux disease without esophagitis: Secondary | ICD-10-CM | POA: Diagnosis not present

## 2023-03-25 DIAGNOSIS — R197 Diarrhea, unspecified: Secondary | ICD-10-CM | POA: Diagnosis not present

## 2023-03-25 DIAGNOSIS — I10 Essential (primary) hypertension: Secondary | ICD-10-CM | POA: Diagnosis not present

## 2023-03-25 DIAGNOSIS — J40 Bronchitis, not specified as acute or chronic: Secondary | ICD-10-CM | POA: Diagnosis not present

## 2023-03-25 DIAGNOSIS — S42292D Other displaced fracture of upper end of left humerus, subsequent encounter for fracture with routine healing: Secondary | ICD-10-CM | POA: Diagnosis not present

## 2023-03-25 DIAGNOSIS — I499 Cardiac arrhythmia, unspecified: Secondary | ICD-10-CM | POA: Diagnosis not present

## 2023-03-25 DIAGNOSIS — M47819 Spondylosis without myelopathy or radiculopathy, site unspecified: Secondary | ICD-10-CM | POA: Diagnosis not present

## 2023-03-25 DIAGNOSIS — E039 Hypothyroidism, unspecified: Secondary | ICD-10-CM | POA: Diagnosis not present

## 2023-03-25 DIAGNOSIS — R002 Palpitations: Secondary | ICD-10-CM | POA: Diagnosis not present

## 2023-03-27 DIAGNOSIS — S42292D Other displaced fracture of upper end of left humerus, subsequent encounter for fracture with routine healing: Secondary | ICD-10-CM | POA: Diagnosis not present

## 2023-03-27 DIAGNOSIS — I499 Cardiac arrhythmia, unspecified: Secondary | ICD-10-CM | POA: Diagnosis not present

## 2023-03-27 DIAGNOSIS — J40 Bronchitis, not specified as acute or chronic: Secondary | ICD-10-CM | POA: Diagnosis not present

## 2023-03-27 DIAGNOSIS — K219 Gastro-esophageal reflux disease without esophagitis: Secondary | ICD-10-CM | POA: Diagnosis not present

## 2023-03-27 DIAGNOSIS — R002 Palpitations: Secondary | ICD-10-CM | POA: Diagnosis not present

## 2023-03-27 DIAGNOSIS — I10 Essential (primary) hypertension: Secondary | ICD-10-CM | POA: Diagnosis not present

## 2023-03-27 DIAGNOSIS — M47819 Spondylosis without myelopathy or radiculopathy, site unspecified: Secondary | ICD-10-CM | POA: Diagnosis not present

## 2023-03-27 DIAGNOSIS — R197 Diarrhea, unspecified: Secondary | ICD-10-CM | POA: Diagnosis not present

## 2023-03-27 DIAGNOSIS — E039 Hypothyroidism, unspecified: Secondary | ICD-10-CM | POA: Diagnosis not present

## 2023-03-28 DIAGNOSIS — I10 Essential (primary) hypertension: Secondary | ICD-10-CM | POA: Diagnosis not present

## 2023-03-28 DIAGNOSIS — I499 Cardiac arrhythmia, unspecified: Secondary | ICD-10-CM | POA: Diagnosis not present

## 2023-03-28 DIAGNOSIS — M47819 Spondylosis without myelopathy or radiculopathy, site unspecified: Secondary | ICD-10-CM | POA: Diagnosis not present

## 2023-03-28 DIAGNOSIS — R197 Diarrhea, unspecified: Secondary | ICD-10-CM | POA: Diagnosis not present

## 2023-03-28 DIAGNOSIS — S42292D Other displaced fracture of upper end of left humerus, subsequent encounter for fracture with routine healing: Secondary | ICD-10-CM | POA: Diagnosis not present

## 2023-03-28 DIAGNOSIS — E039 Hypothyroidism, unspecified: Secondary | ICD-10-CM | POA: Diagnosis not present

## 2023-03-28 DIAGNOSIS — R002 Palpitations: Secondary | ICD-10-CM | POA: Diagnosis not present

## 2023-03-28 DIAGNOSIS — K219 Gastro-esophageal reflux disease without esophagitis: Secondary | ICD-10-CM | POA: Diagnosis not present

## 2023-03-28 DIAGNOSIS — J40 Bronchitis, not specified as acute or chronic: Secondary | ICD-10-CM | POA: Diagnosis not present

## 2023-04-01 DIAGNOSIS — Z8781 Personal history of (healed) traumatic fracture: Secondary | ICD-10-CM | POA: Diagnosis not present

## 2023-04-01 DIAGNOSIS — Z9889 Other specified postprocedural states: Secondary | ICD-10-CM | POA: Diagnosis not present

## 2023-04-01 DIAGNOSIS — S42202D Unspecified fracture of upper end of left humerus, subsequent encounter for fracture with routine healing: Secondary | ICD-10-CM | POA: Diagnosis not present

## 2023-04-02 DIAGNOSIS — D649 Anemia, unspecified: Secondary | ICD-10-CM | POA: Diagnosis not present

## 2023-04-02 DIAGNOSIS — I10 Essential (primary) hypertension: Secondary | ICD-10-CM | POA: Diagnosis not present

## 2023-04-02 DIAGNOSIS — H8111 Benign paroxysmal vertigo, right ear: Secondary | ICD-10-CM | POA: Diagnosis not present

## 2023-04-02 DIAGNOSIS — I499 Cardiac arrhythmia, unspecified: Secondary | ICD-10-CM | POA: Diagnosis not present

## 2023-04-02 DIAGNOSIS — S42255D Nondisplaced fracture of greater tuberosity of left humerus, subsequent encounter for fracture with routine healing: Secondary | ICD-10-CM | POA: Diagnosis not present

## 2023-04-02 DIAGNOSIS — M47812 Spondylosis without myelopathy or radiculopathy, cervical region: Secondary | ICD-10-CM | POA: Diagnosis not present

## 2023-04-02 DIAGNOSIS — S42212D Unspecified displaced fracture of surgical neck of left humerus, subsequent encounter for fracture with routine healing: Secondary | ICD-10-CM | POA: Diagnosis not present

## 2023-04-02 DIAGNOSIS — G2581 Restless legs syndrome: Secondary | ICD-10-CM | POA: Diagnosis not present

## 2023-04-02 DIAGNOSIS — S72002D Fracture of unspecified part of neck of left femur, subsequent encounter for closed fracture with routine healing: Secondary | ICD-10-CM | POA: Diagnosis not present

## 2023-04-02 DIAGNOSIS — E559 Vitamin D deficiency, unspecified: Secondary | ICD-10-CM | POA: Diagnosis not present

## 2023-04-03 ENCOUNTER — Ambulatory Visit: Payer: Medicare HMO | Admitting: Neurology

## 2023-04-04 DIAGNOSIS — D649 Anemia, unspecified: Secondary | ICD-10-CM | POA: Diagnosis not present

## 2023-04-04 DIAGNOSIS — G2581 Restless legs syndrome: Secondary | ICD-10-CM | POA: Diagnosis not present

## 2023-04-04 DIAGNOSIS — S42255D Nondisplaced fracture of greater tuberosity of left humerus, subsequent encounter for fracture with routine healing: Secondary | ICD-10-CM | POA: Diagnosis not present

## 2023-04-04 DIAGNOSIS — S42212D Unspecified displaced fracture of surgical neck of left humerus, subsequent encounter for fracture with routine healing: Secondary | ICD-10-CM | POA: Diagnosis not present

## 2023-04-04 DIAGNOSIS — E559 Vitamin D deficiency, unspecified: Secondary | ICD-10-CM | POA: Diagnosis not present

## 2023-04-04 DIAGNOSIS — I499 Cardiac arrhythmia, unspecified: Secondary | ICD-10-CM | POA: Diagnosis not present

## 2023-04-04 DIAGNOSIS — S72002D Fracture of unspecified part of neck of left femur, subsequent encounter for closed fracture with routine healing: Secondary | ICD-10-CM | POA: Diagnosis not present

## 2023-04-04 DIAGNOSIS — M47812 Spondylosis without myelopathy or radiculopathy, cervical region: Secondary | ICD-10-CM | POA: Diagnosis not present

## 2023-04-04 DIAGNOSIS — I10 Essential (primary) hypertension: Secondary | ICD-10-CM | POA: Diagnosis not present

## 2023-04-08 DIAGNOSIS — S42255D Nondisplaced fracture of greater tuberosity of left humerus, subsequent encounter for fracture with routine healing: Secondary | ICD-10-CM | POA: Diagnosis not present

## 2023-04-08 DIAGNOSIS — D649 Anemia, unspecified: Secondary | ICD-10-CM | POA: Diagnosis not present

## 2023-04-08 DIAGNOSIS — E559 Vitamin D deficiency, unspecified: Secondary | ICD-10-CM | POA: Diagnosis not present

## 2023-04-08 DIAGNOSIS — I10 Essential (primary) hypertension: Secondary | ICD-10-CM | POA: Diagnosis not present

## 2023-04-08 DIAGNOSIS — S42212D Unspecified displaced fracture of surgical neck of left humerus, subsequent encounter for fracture with routine healing: Secondary | ICD-10-CM | POA: Diagnosis not present

## 2023-04-08 DIAGNOSIS — M47812 Spondylosis without myelopathy or radiculopathy, cervical region: Secondary | ICD-10-CM | POA: Diagnosis not present

## 2023-04-08 DIAGNOSIS — G2581 Restless legs syndrome: Secondary | ICD-10-CM | POA: Diagnosis not present

## 2023-04-08 DIAGNOSIS — S72002D Fracture of unspecified part of neck of left femur, subsequent encounter for closed fracture with routine healing: Secondary | ICD-10-CM | POA: Diagnosis not present

## 2023-04-08 DIAGNOSIS — I499 Cardiac arrhythmia, unspecified: Secondary | ICD-10-CM | POA: Diagnosis not present

## 2023-04-09 DIAGNOSIS — E559 Vitamin D deficiency, unspecified: Secondary | ICD-10-CM | POA: Diagnosis not present

## 2023-04-09 DIAGNOSIS — D649 Anemia, unspecified: Secondary | ICD-10-CM | POA: Diagnosis not present

## 2023-04-09 DIAGNOSIS — S42255D Nondisplaced fracture of greater tuberosity of left humerus, subsequent encounter for fracture with routine healing: Secondary | ICD-10-CM | POA: Diagnosis not present

## 2023-04-09 DIAGNOSIS — I499 Cardiac arrhythmia, unspecified: Secondary | ICD-10-CM | POA: Diagnosis not present

## 2023-04-09 DIAGNOSIS — S42212D Unspecified displaced fracture of surgical neck of left humerus, subsequent encounter for fracture with routine healing: Secondary | ICD-10-CM | POA: Diagnosis not present

## 2023-04-09 DIAGNOSIS — G2581 Restless legs syndrome: Secondary | ICD-10-CM | POA: Diagnosis not present

## 2023-04-09 DIAGNOSIS — S72002D Fracture of unspecified part of neck of left femur, subsequent encounter for closed fracture with routine healing: Secondary | ICD-10-CM | POA: Diagnosis not present

## 2023-04-09 DIAGNOSIS — M47812 Spondylosis without myelopathy or radiculopathy, cervical region: Secondary | ICD-10-CM | POA: Diagnosis not present

## 2023-04-09 DIAGNOSIS — I10 Essential (primary) hypertension: Secondary | ICD-10-CM | POA: Diagnosis not present

## 2023-04-11 DIAGNOSIS — M47812 Spondylosis without myelopathy or radiculopathy, cervical region: Secondary | ICD-10-CM | POA: Diagnosis not present

## 2023-04-11 DIAGNOSIS — G2581 Restless legs syndrome: Secondary | ICD-10-CM | POA: Diagnosis not present

## 2023-04-11 DIAGNOSIS — S72002D Fracture of unspecified part of neck of left femur, subsequent encounter for closed fracture with routine healing: Secondary | ICD-10-CM | POA: Diagnosis not present

## 2023-04-11 DIAGNOSIS — D649 Anemia, unspecified: Secondary | ICD-10-CM | POA: Diagnosis not present

## 2023-04-11 DIAGNOSIS — I10 Essential (primary) hypertension: Secondary | ICD-10-CM | POA: Diagnosis not present

## 2023-04-11 DIAGNOSIS — E559 Vitamin D deficiency, unspecified: Secondary | ICD-10-CM | POA: Diagnosis not present

## 2023-04-11 DIAGNOSIS — I499 Cardiac arrhythmia, unspecified: Secondary | ICD-10-CM | POA: Diagnosis not present

## 2023-04-11 DIAGNOSIS — S42255D Nondisplaced fracture of greater tuberosity of left humerus, subsequent encounter for fracture with routine healing: Secondary | ICD-10-CM | POA: Diagnosis not present

## 2023-04-11 DIAGNOSIS — S42212D Unspecified displaced fracture of surgical neck of left humerus, subsequent encounter for fracture with routine healing: Secondary | ICD-10-CM | POA: Diagnosis not present

## 2023-04-16 DIAGNOSIS — I499 Cardiac arrhythmia, unspecified: Secondary | ICD-10-CM | POA: Diagnosis not present

## 2023-04-16 DIAGNOSIS — S42255D Nondisplaced fracture of greater tuberosity of left humerus, subsequent encounter for fracture with routine healing: Secondary | ICD-10-CM | POA: Diagnosis not present

## 2023-04-16 DIAGNOSIS — I10 Essential (primary) hypertension: Secondary | ICD-10-CM | POA: Diagnosis not present

## 2023-04-16 DIAGNOSIS — D649 Anemia, unspecified: Secondary | ICD-10-CM | POA: Diagnosis not present

## 2023-04-16 DIAGNOSIS — E559 Vitamin D deficiency, unspecified: Secondary | ICD-10-CM | POA: Diagnosis not present

## 2023-04-16 DIAGNOSIS — S72002D Fracture of unspecified part of neck of left femur, subsequent encounter for closed fracture with routine healing: Secondary | ICD-10-CM | POA: Diagnosis not present

## 2023-04-16 DIAGNOSIS — S42212D Unspecified displaced fracture of surgical neck of left humerus, subsequent encounter for fracture with routine healing: Secondary | ICD-10-CM | POA: Diagnosis not present

## 2023-04-16 DIAGNOSIS — M47812 Spondylosis without myelopathy or radiculopathy, cervical region: Secondary | ICD-10-CM | POA: Diagnosis not present

## 2023-04-16 DIAGNOSIS — G2581 Restless legs syndrome: Secondary | ICD-10-CM | POA: Diagnosis not present

## 2023-04-19 DIAGNOSIS — G2581 Restless legs syndrome: Secondary | ICD-10-CM | POA: Diagnosis not present

## 2023-04-19 DIAGNOSIS — I499 Cardiac arrhythmia, unspecified: Secondary | ICD-10-CM | POA: Diagnosis not present

## 2023-04-19 DIAGNOSIS — I10 Essential (primary) hypertension: Secondary | ICD-10-CM | POA: Diagnosis not present

## 2023-04-19 DIAGNOSIS — E559 Vitamin D deficiency, unspecified: Secondary | ICD-10-CM | POA: Diagnosis not present

## 2023-04-19 DIAGNOSIS — D649 Anemia, unspecified: Secondary | ICD-10-CM | POA: Diagnosis not present

## 2023-04-19 DIAGNOSIS — S42255D Nondisplaced fracture of greater tuberosity of left humerus, subsequent encounter for fracture with routine healing: Secondary | ICD-10-CM | POA: Diagnosis not present

## 2023-04-19 DIAGNOSIS — S72002D Fracture of unspecified part of neck of left femur, subsequent encounter for closed fracture with routine healing: Secondary | ICD-10-CM | POA: Diagnosis not present

## 2023-04-19 DIAGNOSIS — M47812 Spondylosis without myelopathy or radiculopathy, cervical region: Secondary | ICD-10-CM | POA: Diagnosis not present

## 2023-04-19 DIAGNOSIS — S42212D Unspecified displaced fracture of surgical neck of left humerus, subsequent encounter for fracture with routine healing: Secondary | ICD-10-CM | POA: Diagnosis not present

## 2023-04-23 DIAGNOSIS — I499 Cardiac arrhythmia, unspecified: Secondary | ICD-10-CM | POA: Diagnosis not present

## 2023-04-23 DIAGNOSIS — E559 Vitamin D deficiency, unspecified: Secondary | ICD-10-CM | POA: Diagnosis not present

## 2023-04-23 DIAGNOSIS — S72002D Fracture of unspecified part of neck of left femur, subsequent encounter for closed fracture with routine healing: Secondary | ICD-10-CM | POA: Diagnosis not present

## 2023-04-23 DIAGNOSIS — M47812 Spondylosis without myelopathy or radiculopathy, cervical region: Secondary | ICD-10-CM | POA: Diagnosis not present

## 2023-04-23 DIAGNOSIS — D649 Anemia, unspecified: Secondary | ICD-10-CM | POA: Diagnosis not present

## 2023-04-23 DIAGNOSIS — S42255D Nondisplaced fracture of greater tuberosity of left humerus, subsequent encounter for fracture with routine healing: Secondary | ICD-10-CM | POA: Diagnosis not present

## 2023-04-23 DIAGNOSIS — S42212D Unspecified displaced fracture of surgical neck of left humerus, subsequent encounter for fracture with routine healing: Secondary | ICD-10-CM | POA: Diagnosis not present

## 2023-04-23 DIAGNOSIS — I10 Essential (primary) hypertension: Secondary | ICD-10-CM | POA: Diagnosis not present

## 2023-04-23 DIAGNOSIS — G2581 Restless legs syndrome: Secondary | ICD-10-CM | POA: Diagnosis not present

## 2023-04-26 ENCOUNTER — Ambulatory Visit: Payer: Medicare HMO | Admitting: Neurology

## 2023-04-30 DIAGNOSIS — Z87898 Personal history of other specified conditions: Secondary | ICD-10-CM | POA: Diagnosis not present

## 2023-04-30 DIAGNOSIS — F0390 Unspecified dementia without behavioral disturbance: Secondary | ICD-10-CM | POA: Diagnosis not present

## 2023-04-30 DIAGNOSIS — H8110 Benign paroxysmal vertigo, unspecified ear: Secondary | ICD-10-CM | POA: Diagnosis not present

## 2023-04-30 DIAGNOSIS — E039 Hypothyroidism, unspecified: Secondary | ICD-10-CM | POA: Diagnosis not present

## 2023-04-30 DIAGNOSIS — Z79899 Other long term (current) drug therapy: Secondary | ICD-10-CM | POA: Diagnosis not present

## 2023-04-30 DIAGNOSIS — G4710 Hypersomnia, unspecified: Secondary | ICD-10-CM | POA: Diagnosis not present

## 2023-04-30 DIAGNOSIS — E782 Mixed hyperlipidemia: Secondary | ICD-10-CM | POA: Diagnosis not present

## 2023-04-30 DIAGNOSIS — R7303 Prediabetes: Secondary | ICD-10-CM | POA: Diagnosis not present

## 2023-04-30 DIAGNOSIS — F03918 Unspecified dementia, unspecified severity, with other behavioral disturbance: Secondary | ICD-10-CM | POA: Diagnosis not present

## 2023-04-30 DIAGNOSIS — I10 Essential (primary) hypertension: Secondary | ICD-10-CM | POA: Diagnosis not present

## 2023-04-30 DIAGNOSIS — R296 Repeated falls: Secondary | ICD-10-CM | POA: Diagnosis not present

## 2023-05-02 DIAGNOSIS — G2581 Restless legs syndrome: Secondary | ICD-10-CM | POA: Diagnosis not present

## 2023-05-02 DIAGNOSIS — E559 Vitamin D deficiency, unspecified: Secondary | ICD-10-CM | POA: Diagnosis not present

## 2023-05-02 DIAGNOSIS — S42255D Nondisplaced fracture of greater tuberosity of left humerus, subsequent encounter for fracture with routine healing: Secondary | ICD-10-CM | POA: Diagnosis not present

## 2023-05-02 DIAGNOSIS — D649 Anemia, unspecified: Secondary | ICD-10-CM | POA: Diagnosis not present

## 2023-05-02 DIAGNOSIS — M47812 Spondylosis without myelopathy or radiculopathy, cervical region: Secondary | ICD-10-CM | POA: Diagnosis not present

## 2023-05-02 DIAGNOSIS — S72002D Fracture of unspecified part of neck of left femur, subsequent encounter for closed fracture with routine healing: Secondary | ICD-10-CM | POA: Diagnosis not present

## 2023-05-02 DIAGNOSIS — I499 Cardiac arrhythmia, unspecified: Secondary | ICD-10-CM | POA: Diagnosis not present

## 2023-05-02 DIAGNOSIS — I10 Essential (primary) hypertension: Secondary | ICD-10-CM | POA: Diagnosis not present

## 2023-05-02 DIAGNOSIS — S42212D Unspecified displaced fracture of surgical neck of left humerus, subsequent encounter for fracture with routine healing: Secondary | ICD-10-CM | POA: Diagnosis not present

## 2023-05-10 DIAGNOSIS — E559 Vitamin D deficiency, unspecified: Secondary | ICD-10-CM | POA: Diagnosis not present

## 2023-05-10 DIAGNOSIS — I499 Cardiac arrhythmia, unspecified: Secondary | ICD-10-CM | POA: Diagnosis not present

## 2023-05-10 DIAGNOSIS — S72002D Fracture of unspecified part of neck of left femur, subsequent encounter for closed fracture with routine healing: Secondary | ICD-10-CM | POA: Diagnosis not present

## 2023-05-10 DIAGNOSIS — G2581 Restless legs syndrome: Secondary | ICD-10-CM | POA: Diagnosis not present

## 2023-05-10 DIAGNOSIS — S42212D Unspecified displaced fracture of surgical neck of left humerus, subsequent encounter for fracture with routine healing: Secondary | ICD-10-CM | POA: Diagnosis not present

## 2023-05-10 DIAGNOSIS — I10 Essential (primary) hypertension: Secondary | ICD-10-CM | POA: Diagnosis not present

## 2023-05-10 DIAGNOSIS — D649 Anemia, unspecified: Secondary | ICD-10-CM | POA: Diagnosis not present

## 2023-05-10 DIAGNOSIS — M47812 Spondylosis without myelopathy or radiculopathy, cervical region: Secondary | ICD-10-CM | POA: Diagnosis not present

## 2023-05-10 DIAGNOSIS — S42255D Nondisplaced fracture of greater tuberosity of left humerus, subsequent encounter for fracture with routine healing: Secondary | ICD-10-CM | POA: Diagnosis not present

## 2023-05-14 DIAGNOSIS — S42292A Other displaced fracture of upper end of left humerus, initial encounter for closed fracture: Secondary | ICD-10-CM | POA: Diagnosis not present

## 2023-05-14 DIAGNOSIS — Z8781 Personal history of (healed) traumatic fracture: Secondary | ICD-10-CM | POA: Diagnosis not present

## 2023-05-14 DIAGNOSIS — S72012D Unspecified intracapsular fracture of left femur, subsequent encounter for closed fracture with routine healing: Secondary | ICD-10-CM | POA: Diagnosis not present

## 2023-05-16 DIAGNOSIS — G2581 Restless legs syndrome: Secondary | ICD-10-CM | POA: Diagnosis not present

## 2023-05-16 DIAGNOSIS — S72002D Fracture of unspecified part of neck of left femur, subsequent encounter for closed fracture with routine healing: Secondary | ICD-10-CM | POA: Diagnosis not present

## 2023-05-16 DIAGNOSIS — I10 Essential (primary) hypertension: Secondary | ICD-10-CM | POA: Diagnosis not present

## 2023-05-16 DIAGNOSIS — M47812 Spondylosis without myelopathy or radiculopathy, cervical region: Secondary | ICD-10-CM | POA: Diagnosis not present

## 2023-05-16 DIAGNOSIS — D649 Anemia, unspecified: Secondary | ICD-10-CM | POA: Diagnosis not present

## 2023-05-16 DIAGNOSIS — S42212D Unspecified displaced fracture of surgical neck of left humerus, subsequent encounter for fracture with routine healing: Secondary | ICD-10-CM | POA: Diagnosis not present

## 2023-05-16 DIAGNOSIS — S42255D Nondisplaced fracture of greater tuberosity of left humerus, subsequent encounter for fracture with routine healing: Secondary | ICD-10-CM | POA: Diagnosis not present

## 2023-05-16 DIAGNOSIS — E559 Vitamin D deficiency, unspecified: Secondary | ICD-10-CM | POA: Diagnosis not present

## 2023-05-16 DIAGNOSIS — I499 Cardiac arrhythmia, unspecified: Secondary | ICD-10-CM | POA: Diagnosis not present

## 2023-05-30 DIAGNOSIS — E559 Vitamin D deficiency, unspecified: Secondary | ICD-10-CM | POA: Diagnosis not present

## 2023-05-30 DIAGNOSIS — D649 Anemia, unspecified: Secondary | ICD-10-CM | POA: Diagnosis not present

## 2023-05-30 DIAGNOSIS — M47812 Spondylosis without myelopathy or radiculopathy, cervical region: Secondary | ICD-10-CM | POA: Diagnosis not present

## 2023-05-30 DIAGNOSIS — S42212D Unspecified displaced fracture of surgical neck of left humerus, subsequent encounter for fracture with routine healing: Secondary | ICD-10-CM | POA: Diagnosis not present

## 2023-05-30 DIAGNOSIS — G2581 Restless legs syndrome: Secondary | ICD-10-CM | POA: Diagnosis not present

## 2023-05-30 DIAGNOSIS — S42255D Nondisplaced fracture of greater tuberosity of left humerus, subsequent encounter for fracture with routine healing: Secondary | ICD-10-CM | POA: Diagnosis not present

## 2023-05-30 DIAGNOSIS — I499 Cardiac arrhythmia, unspecified: Secondary | ICD-10-CM | POA: Diagnosis not present

## 2023-05-30 DIAGNOSIS — S72002D Fracture of unspecified part of neck of left femur, subsequent encounter for closed fracture with routine healing: Secondary | ICD-10-CM | POA: Diagnosis not present

## 2023-05-30 DIAGNOSIS — I10 Essential (primary) hypertension: Secondary | ICD-10-CM | POA: Diagnosis not present

## 2023-06-04 DIAGNOSIS — D649 Anemia, unspecified: Secondary | ICD-10-CM | POA: Diagnosis not present

## 2023-06-04 DIAGNOSIS — G2581 Restless legs syndrome: Secondary | ICD-10-CM | POA: Diagnosis not present

## 2023-06-04 DIAGNOSIS — S42255D Nondisplaced fracture of greater tuberosity of left humerus, subsequent encounter for fracture with routine healing: Secondary | ICD-10-CM | POA: Diagnosis not present

## 2023-06-04 DIAGNOSIS — S72002D Fracture of unspecified part of neck of left femur, subsequent encounter for closed fracture with routine healing: Secondary | ICD-10-CM | POA: Diagnosis not present

## 2023-06-04 DIAGNOSIS — E559 Vitamin D deficiency, unspecified: Secondary | ICD-10-CM | POA: Diagnosis not present

## 2023-06-04 DIAGNOSIS — I10 Essential (primary) hypertension: Secondary | ICD-10-CM | POA: Diagnosis not present

## 2023-06-04 DIAGNOSIS — I499 Cardiac arrhythmia, unspecified: Secondary | ICD-10-CM | POA: Diagnosis not present

## 2023-06-04 DIAGNOSIS — S42212D Unspecified displaced fracture of surgical neck of left humerus, subsequent encounter for fracture with routine healing: Secondary | ICD-10-CM | POA: Diagnosis not present

## 2023-06-04 DIAGNOSIS — M47812 Spondylosis without myelopathy or radiculopathy, cervical region: Secondary | ICD-10-CM | POA: Diagnosis not present

## 2023-06-04 DIAGNOSIS — E782 Mixed hyperlipidemia: Secondary | ICD-10-CM | POA: Diagnosis not present

## 2023-06-04 DIAGNOSIS — I6523 Occlusion and stenosis of bilateral carotid arteries: Secondary | ICD-10-CM | POA: Diagnosis not present

## 2023-06-10 DIAGNOSIS — G2581 Restless legs syndrome: Secondary | ICD-10-CM | POA: Diagnosis not present

## 2023-06-10 DIAGNOSIS — E559 Vitamin D deficiency, unspecified: Secondary | ICD-10-CM | POA: Diagnosis not present

## 2023-06-10 DIAGNOSIS — S42255D Nondisplaced fracture of greater tuberosity of left humerus, subsequent encounter for fracture with routine healing: Secondary | ICD-10-CM | POA: Diagnosis not present

## 2023-06-10 DIAGNOSIS — S72002D Fracture of unspecified part of neck of left femur, subsequent encounter for closed fracture with routine healing: Secondary | ICD-10-CM | POA: Diagnosis not present

## 2023-06-10 DIAGNOSIS — I10 Essential (primary) hypertension: Secondary | ICD-10-CM | POA: Diagnosis not present

## 2023-06-10 DIAGNOSIS — I499 Cardiac arrhythmia, unspecified: Secondary | ICD-10-CM | POA: Diagnosis not present

## 2023-06-10 DIAGNOSIS — S42212D Unspecified displaced fracture of surgical neck of left humerus, subsequent encounter for fracture with routine healing: Secondary | ICD-10-CM | POA: Diagnosis not present

## 2023-06-10 DIAGNOSIS — M47812 Spondylosis without myelopathy or radiculopathy, cervical region: Secondary | ICD-10-CM | POA: Diagnosis not present

## 2023-06-10 DIAGNOSIS — D649 Anemia, unspecified: Secondary | ICD-10-CM | POA: Diagnosis not present

## 2023-06-20 DIAGNOSIS — S72002D Fracture of unspecified part of neck of left femur, subsequent encounter for closed fracture with routine healing: Secondary | ICD-10-CM | POA: Diagnosis not present

## 2023-06-20 DIAGNOSIS — E039 Hypothyroidism, unspecified: Secondary | ICD-10-CM | POA: Diagnosis not present

## 2023-06-20 DIAGNOSIS — D649 Anemia, unspecified: Secondary | ICD-10-CM | POA: Diagnosis not present

## 2023-06-20 DIAGNOSIS — S42255D Nondisplaced fracture of greater tuberosity of left humerus, subsequent encounter for fracture with routine healing: Secondary | ICD-10-CM | POA: Diagnosis not present

## 2023-06-20 DIAGNOSIS — F0390 Unspecified dementia without behavioral disturbance: Secondary | ICD-10-CM | POA: Diagnosis not present

## 2023-06-20 DIAGNOSIS — E559 Vitamin D deficiency, unspecified: Secondary | ICD-10-CM | POA: Diagnosis not present

## 2023-06-20 DIAGNOSIS — G2581 Restless legs syndrome: Secondary | ICD-10-CM | POA: Diagnosis not present

## 2023-06-20 DIAGNOSIS — M47812 Spondylosis without myelopathy or radiculopathy, cervical region: Secondary | ICD-10-CM | POA: Diagnosis not present

## 2023-06-20 DIAGNOSIS — S42212D Unspecified displaced fracture of surgical neck of left humerus, subsequent encounter for fracture with routine healing: Secondary | ICD-10-CM | POA: Diagnosis not present

## 2023-06-20 DIAGNOSIS — E782 Mixed hyperlipidemia: Secondary | ICD-10-CM | POA: Diagnosis not present

## 2023-06-20 DIAGNOSIS — I10 Essential (primary) hypertension: Secondary | ICD-10-CM | POA: Diagnosis not present

## 2023-06-20 DIAGNOSIS — I499 Cardiac arrhythmia, unspecified: Secondary | ICD-10-CM | POA: Diagnosis not present

## 2023-06-21 DIAGNOSIS — M7062 Trochanteric bursitis, left hip: Secondary | ICD-10-CM | POA: Diagnosis not present

## 2023-06-21 DIAGNOSIS — Z8781 Personal history of (healed) traumatic fracture: Secondary | ICD-10-CM | POA: Diagnosis not present

## 2023-06-25 DIAGNOSIS — S72002D Fracture of unspecified part of neck of left femur, subsequent encounter for closed fracture with routine healing: Secondary | ICD-10-CM | POA: Diagnosis not present

## 2023-06-25 DIAGNOSIS — G2581 Restless legs syndrome: Secondary | ICD-10-CM | POA: Diagnosis not present

## 2023-06-25 DIAGNOSIS — I499 Cardiac arrhythmia, unspecified: Secondary | ICD-10-CM | POA: Diagnosis not present

## 2023-06-25 DIAGNOSIS — D649 Anemia, unspecified: Secondary | ICD-10-CM | POA: Diagnosis not present

## 2023-06-25 DIAGNOSIS — I10 Essential (primary) hypertension: Secondary | ICD-10-CM | POA: Diagnosis not present

## 2023-06-25 DIAGNOSIS — S42255D Nondisplaced fracture of greater tuberosity of left humerus, subsequent encounter for fracture with routine healing: Secondary | ICD-10-CM | POA: Diagnosis not present

## 2023-06-25 DIAGNOSIS — E559 Vitamin D deficiency, unspecified: Secondary | ICD-10-CM | POA: Diagnosis not present

## 2023-06-25 DIAGNOSIS — S42212D Unspecified displaced fracture of surgical neck of left humerus, subsequent encounter for fracture with routine healing: Secondary | ICD-10-CM | POA: Diagnosis not present

## 2023-06-25 DIAGNOSIS — M47812 Spondylosis without myelopathy or radiculopathy, cervical region: Secondary | ICD-10-CM | POA: Diagnosis not present

## 2023-07-04 DIAGNOSIS — I499 Cardiac arrhythmia, unspecified: Secondary | ICD-10-CM | POA: Diagnosis not present

## 2023-07-04 DIAGNOSIS — S42255D Nondisplaced fracture of greater tuberosity of left humerus, subsequent encounter for fracture with routine healing: Secondary | ICD-10-CM | POA: Diagnosis not present

## 2023-07-04 DIAGNOSIS — M47812 Spondylosis without myelopathy or radiculopathy, cervical region: Secondary | ICD-10-CM | POA: Diagnosis not present

## 2023-07-04 DIAGNOSIS — S42212D Unspecified displaced fracture of surgical neck of left humerus, subsequent encounter for fracture with routine healing: Secondary | ICD-10-CM | POA: Diagnosis not present

## 2023-07-04 DIAGNOSIS — G2581 Restless legs syndrome: Secondary | ICD-10-CM | POA: Diagnosis not present

## 2023-07-04 DIAGNOSIS — I10 Essential (primary) hypertension: Secondary | ICD-10-CM | POA: Diagnosis not present

## 2023-07-04 DIAGNOSIS — D649 Anemia, unspecified: Secondary | ICD-10-CM | POA: Diagnosis not present

## 2023-07-04 DIAGNOSIS — E559 Vitamin D deficiency, unspecified: Secondary | ICD-10-CM | POA: Diagnosis not present

## 2023-07-04 DIAGNOSIS — S72002D Fracture of unspecified part of neck of left femur, subsequent encounter for closed fracture with routine healing: Secondary | ICD-10-CM | POA: Diagnosis not present

## 2023-07-11 DIAGNOSIS — S42255D Nondisplaced fracture of greater tuberosity of left humerus, subsequent encounter for fracture with routine healing: Secondary | ICD-10-CM | POA: Diagnosis not present

## 2023-07-11 DIAGNOSIS — S72002D Fracture of unspecified part of neck of left femur, subsequent encounter for closed fracture with routine healing: Secondary | ICD-10-CM | POA: Diagnosis not present

## 2023-07-11 DIAGNOSIS — S42212D Unspecified displaced fracture of surgical neck of left humerus, subsequent encounter for fracture with routine healing: Secondary | ICD-10-CM | POA: Diagnosis not present

## 2023-07-11 DIAGNOSIS — E559 Vitamin D deficiency, unspecified: Secondary | ICD-10-CM | POA: Diagnosis not present

## 2023-07-11 DIAGNOSIS — I499 Cardiac arrhythmia, unspecified: Secondary | ICD-10-CM | POA: Diagnosis not present

## 2023-07-11 DIAGNOSIS — M47812 Spondylosis without myelopathy or radiculopathy, cervical region: Secondary | ICD-10-CM | POA: Diagnosis not present

## 2023-07-11 DIAGNOSIS — I10 Essential (primary) hypertension: Secondary | ICD-10-CM | POA: Diagnosis not present

## 2023-07-11 DIAGNOSIS — G2581 Restless legs syndrome: Secondary | ICD-10-CM | POA: Diagnosis not present

## 2023-07-11 DIAGNOSIS — D649 Anemia, unspecified: Secondary | ICD-10-CM | POA: Diagnosis not present

## 2023-07-16 DIAGNOSIS — S42212D Unspecified displaced fracture of surgical neck of left humerus, subsequent encounter for fracture with routine healing: Secondary | ICD-10-CM | POA: Diagnosis not present

## 2023-07-16 DIAGNOSIS — D649 Anemia, unspecified: Secondary | ICD-10-CM | POA: Diagnosis not present

## 2023-07-16 DIAGNOSIS — I499 Cardiac arrhythmia, unspecified: Secondary | ICD-10-CM | POA: Diagnosis not present

## 2023-07-16 DIAGNOSIS — G2581 Restless legs syndrome: Secondary | ICD-10-CM | POA: Diagnosis not present

## 2023-07-16 DIAGNOSIS — E559 Vitamin D deficiency, unspecified: Secondary | ICD-10-CM | POA: Diagnosis not present

## 2023-07-16 DIAGNOSIS — I10 Essential (primary) hypertension: Secondary | ICD-10-CM | POA: Diagnosis not present

## 2023-07-16 DIAGNOSIS — S72002D Fracture of unspecified part of neck of left femur, subsequent encounter for closed fracture with routine healing: Secondary | ICD-10-CM | POA: Diagnosis not present

## 2023-07-16 DIAGNOSIS — S42255D Nondisplaced fracture of greater tuberosity of left humerus, subsequent encounter for fracture with routine healing: Secondary | ICD-10-CM | POA: Diagnosis not present

## 2023-07-16 DIAGNOSIS — M47812 Spondylosis without myelopathy or radiculopathy, cervical region: Secondary | ICD-10-CM | POA: Diagnosis not present

## 2023-07-22 DIAGNOSIS — E039 Hypothyroidism, unspecified: Secondary | ICD-10-CM | POA: Diagnosis not present

## 2023-07-22 DIAGNOSIS — I10 Essential (primary) hypertension: Secondary | ICD-10-CM | POA: Diagnosis not present

## 2023-07-22 DIAGNOSIS — E782 Mixed hyperlipidemia: Secondary | ICD-10-CM | POA: Diagnosis not present

## 2023-07-22 DIAGNOSIS — Z79899 Other long term (current) drug therapy: Secondary | ICD-10-CM | POA: Diagnosis not present

## 2023-07-22 DIAGNOSIS — F0390 Unspecified dementia without behavioral disturbance: Secondary | ICD-10-CM | POA: Diagnosis not present

## 2023-07-22 DIAGNOSIS — Z23 Encounter for immunization: Secondary | ICD-10-CM | POA: Diagnosis not present

## 2023-07-23 ENCOUNTER — Other Ambulatory Visit: Payer: Self-pay | Admitting: Internal Medicine

## 2023-07-23 DIAGNOSIS — F0390 Unspecified dementia without behavioral disturbance: Secondary | ICD-10-CM

## 2023-07-25 DIAGNOSIS — E559 Vitamin D deficiency, unspecified: Secondary | ICD-10-CM | POA: Diagnosis not present

## 2023-07-25 DIAGNOSIS — S72002D Fracture of unspecified part of neck of left femur, subsequent encounter for closed fracture with routine healing: Secondary | ICD-10-CM | POA: Diagnosis not present

## 2023-07-25 DIAGNOSIS — I10 Essential (primary) hypertension: Secondary | ICD-10-CM | POA: Diagnosis not present

## 2023-07-25 DIAGNOSIS — S42212D Unspecified displaced fracture of surgical neck of left humerus, subsequent encounter for fracture with routine healing: Secondary | ICD-10-CM | POA: Diagnosis not present

## 2023-07-25 DIAGNOSIS — S42255D Nondisplaced fracture of greater tuberosity of left humerus, subsequent encounter for fracture with routine healing: Secondary | ICD-10-CM | POA: Diagnosis not present

## 2023-07-25 DIAGNOSIS — D649 Anemia, unspecified: Secondary | ICD-10-CM | POA: Diagnosis not present

## 2023-07-25 DIAGNOSIS — G2581 Restless legs syndrome: Secondary | ICD-10-CM | POA: Diagnosis not present

## 2023-07-25 DIAGNOSIS — M47812 Spondylosis without myelopathy or radiculopathy, cervical region: Secondary | ICD-10-CM | POA: Diagnosis not present

## 2023-07-25 DIAGNOSIS — I499 Cardiac arrhythmia, unspecified: Secondary | ICD-10-CM | POA: Diagnosis not present

## 2023-07-29 ENCOUNTER — Ambulatory Visit
Admission: RE | Admit: 2023-07-29 | Discharge: 2023-07-29 | Disposition: A | Discharge status: Home / Self Care | Payer: Medicare HMO | Source: Ambulatory Visit | Attending: Internal Medicine | Admitting: Internal Medicine

## 2023-07-29 DIAGNOSIS — F0390 Unspecified dementia without behavioral disturbance: Secondary | ICD-10-CM | POA: Diagnosis not present

## 2023-07-29 DIAGNOSIS — F02C0 Dementia in other diseases classified elsewhere, severe, without behavioral disturbance, psychotic disturbance, mood disturbance, and anxiety: Secondary | ICD-10-CM | POA: Diagnosis not present

## 2023-07-29 DIAGNOSIS — G319 Degenerative disease of nervous system, unspecified: Secondary | ICD-10-CM | POA: Diagnosis not present

## 2023-08-23 DIAGNOSIS — Z79899 Other long term (current) drug therapy: Secondary | ICD-10-CM | POA: Diagnosis not present

## 2023-09-24 DIAGNOSIS — Z8781 Personal history of (healed) traumatic fracture: Secondary | ICD-10-CM | POA: Diagnosis not present

## 2023-09-24 DIAGNOSIS — Z79899 Other long term (current) drug therapy: Secondary | ICD-10-CM | POA: Diagnosis not present

## 2023-09-24 DIAGNOSIS — E039 Hypothyroidism, unspecified: Secondary | ICD-10-CM | POA: Diagnosis not present

## 2023-09-24 DIAGNOSIS — E782 Mixed hyperlipidemia: Secondary | ICD-10-CM | POA: Diagnosis not present

## 2023-09-24 DIAGNOSIS — F0390 Unspecified dementia without behavioral disturbance: Secondary | ICD-10-CM | POA: Diagnosis not present

## 2023-09-24 DIAGNOSIS — Z9889 Other specified postprocedural states: Secondary | ICD-10-CM | POA: Diagnosis not present

## 2023-09-24 DIAGNOSIS — I10 Essential (primary) hypertension: Secondary | ICD-10-CM | POA: Diagnosis not present

## 2023-09-24 DIAGNOSIS — M7062 Trochanteric bursitis, left hip: Secondary | ICD-10-CM | POA: Diagnosis not present

## 2023-10-03 ENCOUNTER — Emergency Department
Admission: EM | Admit: 2023-10-03 | Discharge: 2023-10-03 | Discharge status: Against Medical Advice | Payer: Medicare HMO | Attending: Emergency Medicine | Admitting: Emergency Medicine

## 2023-10-03 ENCOUNTER — Other Ambulatory Visit: Payer: Self-pay

## 2023-10-03 ENCOUNTER — Encounter: Payer: Self-pay | Admitting: Emergency Medicine

## 2023-10-03 DIAGNOSIS — Z5321 Procedure and treatment not carried out due to patient leaving prior to being seen by health care provider: Secondary | ICD-10-CM | POA: Diagnosis not present

## 2023-10-03 DIAGNOSIS — R109 Unspecified abdominal pain: Secondary | ICD-10-CM | POA: Diagnosis not present

## 2023-10-03 DIAGNOSIS — F03918 Unspecified dementia, unspecified severity, with other behavioral disturbance: Secondary | ICD-10-CM | POA: Diagnosis not present

## 2023-10-03 DIAGNOSIS — F0390 Unspecified dementia without behavioral disturbance: Secondary | ICD-10-CM | POA: Diagnosis not present

## 2023-10-03 DIAGNOSIS — R404 Transient alteration of awareness: Secondary | ICD-10-CM | POA: Diagnosis not present

## 2023-10-03 DIAGNOSIS — R251 Tremor, unspecified: Secondary | ICD-10-CM | POA: Diagnosis not present

## 2023-10-03 DIAGNOSIS — R569 Unspecified convulsions: Secondary | ICD-10-CM | POA: Insufficient documentation

## 2023-10-03 LAB — BASIC METABOLIC PANEL
Anion gap: 12 (ref 5–15)
BUN: 12 mg/dL (ref 8–23)
CO2: 23 mmol/L (ref 22–32)
Calcium: 9.4 mg/dL (ref 8.9–10.3)
Chloride: 92 mmol/L — ABNORMAL LOW (ref 98–111)
Creatinine, Ser: 0.77 mg/dL (ref 0.44–1.00)
GFR, Estimated: >60 mL/min (ref >60)
Glucose, Bld: 92 mg/dL (ref 70–99)
Potassium: 4.2 mmol/L (ref 3.5–5.1)
Sodium: 127 mmol/L — ABNORMAL LOW (ref 135–145)

## 2023-10-03 LAB — CBC
HCT: 36.4 % (ref 36.0–46.0)
Hemoglobin: 11.8 g/dL — ABNORMAL LOW (ref 12.0–15.0)
MCH: 25.8 pg — ABNORMAL LOW (ref 26.0–34.0)
MCHC: 32.4 g/dL (ref 30.0–36.0)
MCV: 79.5 fL — ABNORMAL LOW (ref 80.0–100.0)
Platelets: 326 10*3/uL (ref 150–400)
RBC: 4.58 MIL/uL (ref 3.87–5.11)
RDW: 14.9 % (ref 11.5–15.5)
WBC: 9.6 10*3/uL (ref 4.0–10.5)
nRBC: 0 % (ref 0.0–0.2)

## 2023-10-03 NOTE — ED Triage Notes (Signed)
Patient to ED via POV from [facility] for possible seizure like activity. No hx of same. Pt was leaving appointment and had a jerking motion (like when you have a chill per daughter) and was spitting into a trash can. Daughter reports her eyes were open the whole time but not answering questions just saying her stomach hurt. C/o intermittent abd pain. Hx of dementia.

## 2023-10-03 NOTE — ED Notes (Addendum)
First Nurse Note;  Pt daughter to the First Nurse Desk reports that she spoke with Dr. Sherryll Burger and states she is scheduling an appointment with Dr. Sherryll Burger in the morning and was instructed to come back if patient has another seizure.

## 2023-11-08 ENCOUNTER — Other Ambulatory Visit: Payer: Self-pay

## 2023-11-08 ENCOUNTER — Encounter (HOSPITAL_COMMUNITY): Payer: Self-pay

## 2023-11-08 ENCOUNTER — Emergency Department (HOSPITAL_COMMUNITY): Payer: Medicare HMO

## 2023-11-08 ENCOUNTER — Observation Stay (HOSPITAL_COMMUNITY)
Admission: EM | Admit: 2023-11-08 | Discharge: 2023-11-09 | Disposition: A | Discharge status: Home / Self Care | Payer: Medicare HMO | Attending: Internal Medicine | Admitting: Internal Medicine

## 2023-11-08 DIAGNOSIS — R110 Nausea: Secondary | ICD-10-CM | POA: Diagnosis not present

## 2023-11-08 DIAGNOSIS — R410 Disorientation, unspecified: Secondary | ICD-10-CM | POA: Diagnosis not present

## 2023-11-08 DIAGNOSIS — G9341 Metabolic encephalopathy: Secondary | ICD-10-CM | POA: Insufficient documentation

## 2023-11-08 DIAGNOSIS — R112 Nausea with vomiting, unspecified: Secondary | ICD-10-CM | POA: Diagnosis present

## 2023-11-08 DIAGNOSIS — F03918 Unspecified dementia, unspecified severity, with other behavioral disturbance: Secondary | ICD-10-CM | POA: Diagnosis present

## 2023-11-08 DIAGNOSIS — R9402 Abnormal brain scan: Secondary | ICD-10-CM | POA: Insufficient documentation

## 2023-11-08 DIAGNOSIS — Z79899 Other long term (current) drug therapy: Secondary | ICD-10-CM | POA: Insufficient documentation

## 2023-11-08 DIAGNOSIS — E039 Hypothyroidism, unspecified: Secondary | ICD-10-CM | POA: Diagnosis present

## 2023-11-08 DIAGNOSIS — E871 Hypo-osmolality and hyponatremia: Secondary | ICD-10-CM | POA: Insufficient documentation

## 2023-11-08 DIAGNOSIS — Z87891 Personal history of nicotine dependence: Secondary | ICD-10-CM | POA: Diagnosis not present

## 2023-11-08 DIAGNOSIS — R1110 Vomiting, unspecified: Secondary | ICD-10-CM | POA: Diagnosis not present

## 2023-11-08 DIAGNOSIS — R29818 Other symptoms and signs involving the nervous system: Secondary | ICD-10-CM

## 2023-11-08 DIAGNOSIS — K219 Gastro-esophageal reflux disease without esophagitis: Secondary | ICD-10-CM | POA: Insufficient documentation

## 2023-11-08 DIAGNOSIS — I6782 Cerebral ischemia: Secondary | ICD-10-CM | POA: Diagnosis not present

## 2023-11-08 DIAGNOSIS — K529 Noninfective gastroenteritis and colitis, unspecified: Secondary | ICD-10-CM | POA: Diagnosis not present

## 2023-11-08 DIAGNOSIS — R29810 Facial weakness: Secondary | ICD-10-CM | POA: Diagnosis not present

## 2023-11-08 DIAGNOSIS — E785 Hyperlipidemia, unspecified: Secondary | ICD-10-CM | POA: Diagnosis not present

## 2023-11-08 DIAGNOSIS — I6523 Occlusion and stenosis of bilateral carotid arteries: Secondary | ICD-10-CM | POA: Diagnosis not present

## 2023-11-08 DIAGNOSIS — R231 Pallor: Secondary | ICD-10-CM | POA: Diagnosis not present

## 2023-11-08 DIAGNOSIS — F0390 Unspecified dementia without behavioral disturbance: Secondary | ICD-10-CM | POA: Diagnosis not present

## 2023-11-08 DIAGNOSIS — I10 Essential (primary) hypertension: Secondary | ICD-10-CM | POA: Insufficient documentation

## 2023-11-08 DIAGNOSIS — R4182 Altered mental status, unspecified: Secondary | ICD-10-CM | POA: Diagnosis not present

## 2023-11-08 DIAGNOSIS — Z1152 Encounter for screening for COVID-19: Secondary | ICD-10-CM | POA: Insufficient documentation

## 2023-11-08 LAB — I-STAT CHEM 8, ED
BUN: 7 mg/dL — ABNORMAL LOW (ref 8–23)
Calcium, Ion: 1.16 mmol/L (ref 1.15–1.40)
Chloride: 92 mmol/L — ABNORMAL LOW (ref 98–111)
Creatinine, Ser: 0.8 mg/dL (ref 0.44–1.00)
Glucose, Bld: 111 mg/dL — ABNORMAL HIGH (ref 70–99)
HCT: 38 % (ref 36.0–46.0)
Hemoglobin: 12.9 g/dL (ref 12.0–15.0)
Potassium: 4.3 mmol/L (ref 3.5–5.1)
Sodium: 126 mmol/L — ABNORMAL LOW (ref 135–145)
TCO2: 25 mmol/L (ref 22–32)

## 2023-11-08 LAB — DIFFERENTIAL
Abs Immature Granulocytes: 0.02 10*3/uL (ref 0.00–0.07)
Basophils Absolute: 0 10*3/uL (ref 0.0–0.1)
Basophils Relative: 0 %
Eosinophils Absolute: 0 10*3/uL (ref 0.0–0.5)
Eosinophils Relative: 0 %
Immature Granulocytes: 0 %
Lymphocytes Relative: 13 %
Lymphs Abs: 1 10*3/uL (ref 0.7–4.0)
Monocytes Absolute: 0.4 10*3/uL (ref 0.1–1.0)
Monocytes Relative: 5 %
Neutro Abs: 6.5 10*3/uL (ref 1.7–7.7)
Neutrophils Relative %: 82 %

## 2023-11-08 LAB — CBC
HCT: 35.5 % — ABNORMAL LOW (ref 36.0–46.0)
HCT: 34.6 % — ABNORMAL LOW (ref 36.0–46.0)
Hemoglobin: 11.7 g/dL — ABNORMAL LOW (ref 12.0–15.0)
Hemoglobin: 11.2 g/dL — ABNORMAL LOW (ref 12.0–15.0)
MCH: 26.7 pg (ref 26.0–34.0)
MCH: 26.4 pg (ref 26.0–34.0)
MCHC: 33 g/dL (ref 30.0–36.0)
MCHC: 32.4 g/dL (ref 30.0–36.0)
MCV: 81.1 fL (ref 80.0–100.0)
MCV: 81.6 fL (ref 80.0–100.0)
Platelets: 321 10*3/uL (ref 150–400)
Platelets: 261 10*3/uL (ref 150–400)
RBC: 4.38 MIL/uL (ref 3.87–5.11)
RBC: 4.24 MIL/uL (ref 3.87–5.11)
RDW: 15.3 % (ref 11.5–15.5)
RDW: 15.4 % (ref 11.5–15.5)
WBC: 8 10*3/uL (ref 4.0–10.5)
WBC: 7.5 10*3/uL (ref 4.0–10.5)
nRBC: 0 % (ref 0.0–0.2)
nRBC: 0 % (ref 0.0–0.2)

## 2023-11-08 LAB — PROTIME-INR
INR: 1.1 (ref 0.8–1.2)
Prothrombin Time: 14.5 s (ref 11.4–15.2)

## 2023-11-08 LAB — RESP PANEL BY RT-PCR (RSV, FLU A&B, COVID)  RVPGX2
Influenza A by PCR: NEGATIVE
Influenza B by PCR: NEGATIVE
Resp Syncytial Virus by PCR: NEGATIVE
SARS Coronavirus 2 by RT PCR: NEGATIVE

## 2023-11-08 LAB — COMPREHENSIVE METABOLIC PANEL
ALT: 15 U/L (ref 0–44)
AST: 20 U/L (ref 15–41)
Albumin: 3.9 g/dL (ref 3.5–5.0)
Alkaline Phosphatase: 95 U/L (ref 38–126)
Anion gap: 10 (ref 5–15)
BUN: 7 mg/dL — ABNORMAL LOW (ref 8–23)
CO2: 22 mmol/L (ref 22–32)
Calcium: 9.3 mg/dL (ref 8.9–10.3)
Chloride: 92 mmol/L — ABNORMAL LOW (ref 98–111)
Creatinine, Ser: 0.85 mg/dL (ref 0.44–1.00)
GFR, Estimated: >60 mL/min (ref >60)
Glucose, Bld: 111 mg/dL — ABNORMAL HIGH (ref 70–99)
Potassium: 4.3 mmol/L (ref 3.5–5.1)
Sodium: 124 mmol/L — ABNORMAL LOW (ref 135–145)
Total Bilirubin: 0.4 mg/dL (ref 0.0–1.2)
Total Protein: 7.1 g/dL (ref 6.5–8.1)

## 2023-11-08 LAB — TSH: TSH: 1.764 u[IU]/mL (ref 0.350–4.500)

## 2023-11-08 LAB — APTT: aPTT: 35 s (ref 24–36)

## 2023-11-08 LAB — CBG MONITORING, ED: Glucose-Capillary: 132 mg/dL — ABNORMAL HIGH (ref 70–99)

## 2023-11-08 LAB — ETHANOL: Alcohol, Ethyl (B): <10 mg/dL (ref <10)

## 2023-11-08 MED ORDER — CALCIUM CARBONATE 1250 (500 CA) MG PO TABS
1.0000 | ORAL_TABLET | Freq: Every day | ORAL | Status: DC
  Administered 2023-11-09: 1250 mg via ORAL
  Filled 2023-11-08: qty 1

## 2023-11-08 MED ORDER — ACETAMINOPHEN 325 MG PO TABS
650.0000 mg | ORAL_TABLET | Freq: Four times a day (QID) | ORAL | Status: DC | PRN
Start: 2023-11-08 — End: 2023-11-09

## 2023-11-08 MED ORDER — LEVOTHYROXINE SODIUM 75 MCG PO TABS
62.5000 ug | ORAL_TABLET | Freq: Every day | ORAL | Status: DC
  Administered 2023-11-09: 62.5 ug via ORAL
  Filled 2023-11-08: qty 1

## 2023-11-08 MED ORDER — SODIUM CHLORIDE 0.9 % IV SOLN: INTRAVENOUS | Status: DC

## 2023-11-08 MED ORDER — DILTIAZEM HCL ER COATED BEADS 240 MG PO CP24
240.0000 mg | ORAL_CAPSULE | Freq: Every day | ORAL | Status: DC
  Administered 2023-11-09: 240 mg via ORAL
  Filled 2023-11-08: qty 2
  Filled 2023-11-08: qty 1

## 2023-11-08 MED ORDER — SODIUM CHLORIDE 0.9% FLUSH
3.0000 mL | Freq: Two times a day (BID) | INTRAVENOUS | Status: DC
  Administered 2023-11-08 – 2023-11-09 (×2): 3 mL via INTRAVENOUS

## 2023-11-08 MED ORDER — POLYETHYLENE GLYCOL 3350 17 G PO PACK: 17.0000 g | PACK | Freq: Every day | ORAL | Status: DC | PRN

## 2023-11-08 MED ORDER — SODIUM CHLORIDE 0.9 % IV BOLUS
500.0000 mL | Freq: Once | INTRAVENOUS | Status: AC
  Administered 2023-11-08: 500 mL via INTRAVENOUS

## 2023-11-08 MED ORDER — PRAVASTATIN SODIUM 40 MG PO TABS
40.0000 mg | ORAL_TABLET | Freq: Every day | ORAL | Status: DC
  Administered 2023-11-09: 40 mg via ORAL
  Filled 2023-11-08: qty 1

## 2023-11-08 MED ORDER — ENOXAPARIN SODIUM 40 MG/0.4ML IJ SOSY
40.0000 mg | PREFILLED_SYRINGE | INTRAMUSCULAR | Status: DC
  Administered 2023-11-09: 40 mg via SUBCUTANEOUS
  Filled 2023-11-08: qty 0.4

## 2023-11-08 MED ORDER — METOPROLOL TARTRATE 25 MG PO TABS
25.0000 mg | ORAL_TABLET | Freq: Two times a day (BID) | ORAL | Status: DC
Start: 2023-11-08 — End: 2023-11-09
  Administered 2023-11-08 – 2023-11-09 (×2): 25 mg via ORAL
  Filled 2023-11-08 (×2): qty 1

## 2023-11-08 MED ORDER — PANTOPRAZOLE SODIUM 40 MG IV SOLR
40.0000 mg | INTRAVENOUS | Status: DC
  Administered 2023-11-08: 40 mg via INTRAVENOUS
  Filled 2023-11-08: qty 10

## 2023-11-08 MED ORDER — CALCIUM CARBONATE 600 MG PO TABS: 600.0000 mg | ORAL_TABLET | Freq: Two times a day (BID) | ORAL | Status: DC

## 2023-11-08 MED ORDER — MODAFINIL 100 MG PO TABS
100.0000 mg | ORAL_TABLET | Freq: Every day | ORAL | Status: DC
  Administered 2023-11-09: 100 mg via ORAL
  Filled 2023-11-08: qty 1

## 2023-11-08 MED ORDER — ONDANSETRON HCL 4 MG/2ML IJ SOLN: 4.0000 mg | Freq: Four times a day (QID) | INTRAMUSCULAR | Status: DC | PRN

## 2023-11-08 MED ORDER — ACETAMINOPHEN 650 MG RE SUPP: 650.0000 mg | Freq: Four times a day (QID) | RECTAL | Status: DC | PRN

## 2023-11-08 MED ORDER — VENLAFAXINE HCL ER 75 MG PO CP24
75.0000 mg | ORAL_CAPSULE | Freq: Every day | ORAL | Status: DC
  Administered 2023-11-09: 75 mg via ORAL
  Filled 2023-11-08: qty 1

## 2023-11-08 NOTE — ED Provider Notes (Addendum)
[place] EMERGENCY DEPARTMENT AT [facility] Provider Note   CSN: [id number] Arrival date & time: 11/08/23  1638     History  Chief Complaint  Patient presents with   Altered Mental Status   Weakness    Charlene Thomas is a 78 y.o. female.  Patient brought in by EMS for mental status change.  Patient did have a fall last week.  Triage mention right-sided facial droop unknown last known normal.  The facial droop seem to have resolved patient returned to baseline has a history of dementia.  Patient's blood pressure was 146/94 blood sugar was 130.  Temp 98.2 blood pressure 143/92 oxygen sats 96% pulse 78%.  Patient's primary care doctors are in the [place] area.  Patient's past medical history of hypertension hypothyroidism heart palpitations.  Patient's had abdominal hysterectomy and gallbladder removal.  Patient is a former smoker.  Family is also concerned about her having flu.  Even though not a lot of respiratory symptoms.  But has been exposed.       Home Medications Prior to Admission medications   Medication Sig Start Date End Date Taking? Authorizing Provider  acetaminophen (TYLENOL) 500 MG tablet Take 1-2 tablets (500-1,000 mg total) by mouth every 6 (six) hours as needed for mild pain. 03/10/23  Yes Anson Oregon, PA-C  baclofen (LIORESAL) 20 MG tablet Take 20 mg by mouth 2 (two) times daily. 09/24/23  Yes [provider]  calcium carbonate (CALCIUM 600) 600 MG TABS tablet Take 1 tablet (600 mg total) by mouth 2 (two) times daily with a meal. 03/10/23  Yes Wieting, Richard, MD  cetirizine (ZYRTEC) 10 MG tablet Take 10 mg by mouth daily.   Yes [provider]  desvenlafaxine (PRISTIQ) 50 MG 24 hr tablet Take 50 mg by mouth daily.   Yes [provider]  diltiazem (CARDIZEM CD) 240 MG 24 hr capsule Take 240 mg by mouth daily. 01/02/23  Yes [provider]  ibuprofen (ADVIL) 400 MG tablet Take 400 mg by mouth every 6 (six)  hours as needed. 02/12/23  Yes [provider]  levothyroxine (SYNTHROID) 125 MCG tablet Take 0.5 tablets by mouth daily. 12/28/22  Yes [provider]  losartan (COZAAR) 100 MG tablet Take 100 mg by mouth daily. 12/28/22  Yes [provider]  metoprolol tartrate (LOPRESSOR) 25 MG tablet Take 25 mg by mouth 2 (two) times daily. 12/28/22  Yes [provider]  modafinil (PROVIGIL) 100 MG tablet Take 100 mg by mouth daily.   Yes [provider]  NYSTATIN powder Apply 1 Application topically 2 (two) times daily.   Yes [provider]  omeprazole (PRILOSEC) 20 MG capsule Take 20 mg by mouth 2 (two) times daily. 02/19/16  Yes [provider]  pravastatin (PRAVACHOL) 40 MG tablet Take 1 tablet (40 mg total) by mouth daily. 01/28/23  Yes Delfino Lovett, MD  Vitamin D, Ergocalciferol, (DRISDOL) 1.25 MG (50000 UNIT) CAPS capsule Take 50,000 Units by mouth once a week. On Sundays 02/11/23  Yes [provider]  albuterol (VENTOLIN HFA) 108 (90 Base) MCG/ACT inhaler Inhale 2 puffs into the lungs every 6 (six) hours as needed for wheezing or shortness of breath. Patient not taking: Reported on 11/08/2023 03/10/23   Alford Highland, MD  alendronate (FOSAMAX) 70 MG tablet Take 1 tablet (70 mg total) by mouth once a week. Take with a full glass of water on an empty stomach. Patient not taking: Reported on 11/08/2023 03/10/23  Alford Highland, MD  DULoxetine (CYMBALTA) 30 MG capsule Take 30 mg by mouth daily. Patient not taking: Reported on 11/08/2023    [provider]  enoxaparin (LOVENOX) 40 MG/0.4ML injection Inject 0.4 mLs (40 mg total) into the skin daily. Patient not taking: Reported on 11/08/2023 03/10/23   Anson Oregon, PA-C  HYDROcodone-acetaminophen (NORCO/VICODIN) 5-325 MG tablet Take 0.5-1 tablets by mouth every 8 (eight) hours as needed for severe pain. Patient not taking: Reported on 11/08/2023 03/10/23   Anson Oregon, PA-C       Allergies    Topiramate, Aspirin, Celecoxib, Ezetimibe-simvastatin, Mobic [meloxicam], and Raloxifene    Review of Systems   Review of Systems  Unable to perform ROS: Mental status change    Physical Exam Updated Vital Signs BP 128/85   Pulse 83   Temp 98.2 F (36.8 C) (Oral)   Resp 16   Ht 1.6 m (5\' 3" )   Wt 59 kg   SpO2 99%   BMI 23.03 kg/m  Physical Exam Vitals and nursing note reviewed.  Constitutional:      General: She is not in acute distress.    Appearance: Normal appearance. She is well-developed.  HENT:     Head: Normocephalic and atraumatic.     Mouth/Throat:     Mouth: Mucous membranes are dry.  Eyes:     Extraocular Movements: Extraocular movements intact.     Conjunctiva/sclera: Conjunctivae normal.     Pupils: Pupils are equal, round, and reactive to light.  Cardiovascular:     Rate and Rhythm: Normal rate and regular rhythm.     Heart sounds: No murmur heard. Pulmonary:     Effort: Pulmonary effort is normal. No respiratory distress.     Breath sounds: Normal breath sounds.  Abdominal:     Palpations: Abdomen is soft.     Tenderness: There is no abdominal tenderness.  Musculoskeletal:        General: No swelling.     Cervical back: Normal range of motion and neck supple.  Skin:    General: Skin is warm and dry.     Capillary Refill: Capillary refill takes less than 2 seconds.  Neurological:     Mental Status: She is alert.     Cranial Nerves: No cranial nerve deficit.     Sensory: No sensory deficit.     Motor: No weakness.     Comments: Patient seems to be confused.  Psychiatric:        Mood and Affect: Mood normal.     ED Results / Procedures / Treatments   Labs (all labs ordered are listed, but only abnormal results are displayed) Labs Reviewed  CBC - Abnormal; Notable for the following components:      Result Value   Hemoglobin 11.7 (*)    HCT 35.5 (*)    All other components within normal limits  COMPREHENSIVE METABOLIC  PANEL - Abnormal; Notable for the following components:   Sodium 124 (*)    Chloride 92 (*)    Glucose, Bld 111 (*)    BUN 7 (*)    All other components within normal limits  CBG MONITORING, ED - Abnormal; Notable for the following components:   Glucose-Capillary 132 (*)    All other components within normal limits  I-STAT CHEM 8, ED - Abnormal; Notable for the following components:   Sodium 126 (*)    Chloride 92 (*)    BUN 7 (*)    Glucose, Bld 111 (*)  All other components within normal limits  RESP PANEL BY RT-PCR (RSV, FLU A&B, COVID)  RVPGX2  PROTIME-INR  APTT  DIFFERENTIAL  ETHANOL  RAPID URINE DRUG SCREEN, HOSP PERFORMED  URINALYSIS, ROUTINE W REFLEX MICROSCOPIC  TSH    EKG EKG Interpretation Date/Time:  Friday November 08 2023 16:40:39 EST Ventricular Rate:  79 PR Interval:  189 QRS Duration:  74 QT Interval:  359 QTC Calculation: 412 R Axis:   8  Text Interpretation: Sinus rhythm Low voltage, precordial leads Abnormal R-wave progression, early transition No significant change since last tracing Confirmed by Vanetta Mulders [phone]) on 11/08/2023 4:46:37 PM  Radiology CT HEAD WO CONTRAST Result Date: 11/08/2023 CLINICAL DATA:  Acute stroke suspected. EXAM: CT HEAD WITHOUT CONTRAST TECHNIQUE: Contiguous axial images were obtained from the base of the skull through the vertex without intravenous contrast. RADIATION DOSE REDUCTION: This exam was performed according to the departmental dose-optimization program which includes automated exposure control, adjustment of the mA and/or kV according to patient size and/or use of iterative reconstruction technique. COMPARISON:  MRI head 07/29/2023 FINDINGS: Brain: No evidence of acute infarction, hemorrhage, hydrocephalus, extra-axial collection or mass lesion/mass effect. There is mild diffuse atrophy. There is moderate periventricular white matter hypodensity, likely chronic small vessel ischemic change. Vascular:  Atherosclerotic calcifications are present within the cavernous internal carotid arteries. Skull: Normal. Negative for fracture or focal lesion. Sinuses/Orbits: No acute finding. Other: None. IMPRESSION: 1. No acute intracranial process. 2. Mild diffuse atrophy and moderate chronic small vessel ischemic change. Electronically Signed   By: Darliss Cheney M.D.   On: 11/08/2023 19:07   DG Chest Port 1 View Result Date: 11/08/2023 CLINICAL DATA:  Altered mental status. EXAM: PORTABLE CHEST 1 VIEW COMPARISON:  03/10/2023 x-ray FINDINGS: Chronic deformity of the left humeral head which could be posttraumatic. No consolidation, pneumothorax or effusion. Normal cardiopericardial silhouette without edema. Overlapping cardiac leads. Film is slightly rotated to the right. IMPRESSION: No acute cardiopulmonary disease. Electronically Signed   By: Karen Kays M.D.   On: 11/08/2023 18:19    Procedures Procedures    Medications Ordered in ED Medications  0.9 %  sodium chloride infusion (has no administration in time range)  sodium chloride 0.9 % bolus 500 mL (500 mLs Intravenous New Bag/Given 11/08/23 2128)    ED Course/ Medical Decision Making/ A&P                                 Medical Decision Making Amount and/or Complexity of Data Reviewed Labs: ordered. Radiology: ordered.  Risk Prescription drug management. Decision regarding hospitalization.   Also family worried about patient having flu.  Patient been very sleepy at home.  Patient with some confusion family thinks she is somewhat back to baseline this could be dementia.  But concerned about the sodium being 124.  Otherwise electrolytes are pretty normal.  Also patient has a history of hypothyroidism and have ordered TSH as well.  Urinalysis still pending.  Chest x-ray without any acute findings head.  Head CT without any acute findings.  Do not think the patient necessarily had a stroke there could have been a TIA may be based on what  occurred.  Did not order MRI but did pass this information on to the hospitalist for admission.  CBC hemoglobin 11.7 white count 8.0 platelets 321.  Patient's respiratory panel negative.  Patient receiving IV fluids.  Discussed with the hospitalist who agreed to admit  for the hyponatremia.  Final Clinical Impression(s) / ED Diagnoses Final diagnoses:  Altered mental status, unspecified altered mental status type  Hyponatremia    Rx / DC Orders ED Discharge Orders     None         Vanetta Mulders, MD 11/08/23 2013    Vanetta Mulders, MD 11/08/23 2147

## 2023-11-08 NOTE — Assessment & Plan Note (Addendum)
Started yesterday with 3 episodes of diarrhea continuing today with more episodes of vomiting.  I will check abdomen panel as well as lipase.  No white count no fever patient looking nontoxic and not complaining of abdominal abdominal pain.  Diarrhea has currently remitted today.  Therefore I will treat this patient symptomatically with Zofran as needed and monitor clinically.  Empiric pantoprazole also ordered. Cehck troponin

## 2023-11-08 NOTE — Assessment & Plan Note (Signed)
C/w pravastatin

## 2023-11-08 NOTE — Assessment & Plan Note (Addendum)
Pending serum osmolality to confirm that this is true hyponatremia.  However this seems to be chronic moderate and unlikely to be symptomatic.  Pending urine sodium.  Given recent history of diarrhea and vomiting I suspect this is prerenal.  Patient already started on saline infusion in the ER.  We will trend basic metabolic tonight. TSH pending. Will cehck cortisol in AM  Hold losartan

## 2023-11-08 NOTE — ED Triage Notes (Signed)
PT BIB [facility] from home after daughter witnessed change in mental status and R sided facial droop. Unknown LKW. Family called hours after event when it seemingly resolved and PT returned to baseline. PT has Hx of dementia, but typically Aox4 with some memory decline. PT reported to have fallen last week. [facility] vitals: BP 146/94, HR 72 rr16 96% 13rr CBG 130.

## 2023-11-08 NOTE — Assessment & Plan Note (Signed)
C.w. synthroid

## 2023-11-08 NOTE — Assessment & Plan Note (Signed)
Patient had chronic dementia.  Patient definitely has acute illness going on in terms of gastroenteritis with associated worsening mentation as reported by family in terms of inability to recognize family members.  Patient is currently at baseline of her mentation.  There is report of patient having left/right facial droop transiently.  That resolved before EMS got there.  Currently patient is noted to have some left lower extremity weakness, however I am advised that this is chronic.  And patient does use a bedside commode due to her gait instability.  Given the overall picture my impression is that the patient had acute metabolic encephalopathy.  However stroke/TIA cannot be ruled out 100%.  Therefore I did offer MRI to the patient.  However patient is not interested in getting an MRI.  She wants to get out of the hospital as soon as possible if possible tonight.  T therefore we will continue to monitor this patient neurologic status.

## 2023-11-08 NOTE — Assessment & Plan Note (Addendum)
C.w. metoprolol and diltiazem. Restart losartan in AM based on creatiine, BP and sodium

## 2023-11-08 NOTE — H&P (Signed)
History and Physical    Patient: Charlene Thomas NGE:[id number] DOB: 28-Jun-1946 DOA: 11/08/2023 DOS: the patient was seen and examined on 11/08/2023 PCP: Marguarite Arbour, MD  Patient coming from: Home  Chief Complaint:  Chief Complaint  Patient presents with   Altered Mental Status   Weakness   HPI: Charlene Thomas is a 78 y.o. female with medical history significant of demtnia with "short term memory loss" per daughter Charlene Thomas at the bedside.  Daughter is the principal historian for this encounter.  Patient seems to have been in her usual state of health till yesterday morning when she started to have new onset of diarrhea.  Patient seems to have had at least 3 episodes of loose watery bowel movements without any report of bleeding.  Patient also had at least 2 episodes of vomiting and other episodes of retching.  There is no report of patient having had any fevers.  However patient was noted to be to be intermittently trembling and intermittently sweating.  She was also incontinent of urine overnight.  Earlier today patient was noted to have increasing difficulty recognizing family members which is unusual for her.  And also more episodes of vomiting in the noon time.  Patient was also noted to have questionable left (daughter told me me left)  facial droop at approximately 2:30 PM - this was specifically checked by daughter based on advice received over phone..however, it resolved by the time EMS got there (the history of EMS record apparent right facial droop prior to arrrival).  Which prompted a call to EMS and patient is brought to [facility], ER.  Per daughter patient has been at neurologic baseline since then.  Patient had more episodes of vomiting and route to [facility].  Workup in the ER is notable for hyponatremia.  Head CAT scan is nonactionable.  Medical evaluation is sought Review of Systems: As mentioned in the history of present illness. All other systems reviewed and are  negative. Past Medical History:  Diagnosis Date   Anginal pain (HCC)    Arthritis    Dysrhythmia    TACHYCARDIA   GERD (gastroesophageal reflux disease)    Heart palpitations    Hypertension    Hypothyroidism    Past Surgical History:  Procedure Laterality Date   ABDOMINAL HYSTERECTOMY     CATARACT EXTRACTION W/PHACO Left 05/15/2016   Procedure: CATARACT EXTRACTION PHACO AND INTRAOCULAR LENS PLACEMENT (IOC);  Surgeon: Galen Manila, MD;  Location: [facility] ORS;  Service: Ophthalmology;  Laterality: Left;  [place] 00:34 AP% 20.1 CDE 6.98 Fluid pack lot # [id number]   CATARACT EXTRACTION W/PHACO Right 06/12/2016   Procedure: CATARACT EXTRACTION PHACO AND INTRAOCULAR LENS PLACEMENT (IOC);  Surgeon: Galen Manila, MD;  Location: [facility] ORS;  Service: Ophthalmology;  Laterality: Right;  Lot# [id number] H [place]: 1:05.5 AP%:45.8 CDE:12.87   CESAREAN SECTION     X 3   CHOLECYSTECTOMY     COMPRESSION HIP SCREW Left 03/08/2023   Procedure: Cannulated Screw Fixation of the Left Femoral Neck Fracture;  Surgeon: Christena Flake, MD;  Location: [facility] ORS;  Service: Orthopedics;  Laterality: Left;   GRAFTS     SKIN GRAFTS FOR BURNS   KIDNEY STONE SURGERY     THROAT SURGERY     Social History:  reports that she has quit smoking. She has never used smokeless tobacco. She reports that she does not drink alcohol and does not use drugs.  Allergies  Allergen Reactions   Topiramate Other (See Comments)  Causes severe confusion   Aspirin    Celecoxib Other (See Comments)    Gi upset   Ezetimibe-Simvastatin Other (See Comments)    Other Reaction(s): Muscle Pain   Mobic [Meloxicam] Itching   Raloxifene Other (See Comments)    Other Reaction(s): Unknown  Upset gi    Family History  Problem Relation Age of Onset   Breast cancer Neg Hx     Prior to Admission medications   Medication Sig Start Date End Date Taking? Authorizing Provider  acetaminophen (TYLENOL) 500 MG tablet Take 1-2 tablets (500-1,000 mg  total) by mouth every 6 (six) hours as needed for mild pain. 03/10/23  Yes Anson Oregon, PA-C  baclofen (LIORESAL) 20 MG tablet Take 20 mg by mouth 2 (two) times daily. 09/24/23  Yes [provider]  calcium carbonate (CALCIUM 600) 600 MG TABS tablet Take 1 tablet (600 mg total) by mouth 2 (two) times daily with a meal. 03/10/23  Yes Wieting, Richard, MD  cetirizine (ZYRTEC) 10 MG tablet Take 10 mg by mouth daily.   Yes [provider]  desvenlafaxine (PRISTIQ) 50 MG 24 hr tablet Take 50 mg by mouth daily.   Yes [provider]  diltiazem (CARDIZEM CD) 240 MG 24 hr capsule Take 240 mg by mouth daily. 01/02/23  Yes [provider]  ibuprofen (ADVIL) 400 MG tablet Take 400 mg by mouth every 6 (six) hours as needed. 02/12/23  Yes [provider]  levothyroxine (SYNTHROID) 125 MCG tablet Take 0.5 tablets by mouth daily. 12/28/22  Yes [provider]  losartan (COZAAR) 100 MG tablet Take 100 mg by mouth daily. 12/28/22  Yes [provider]  metoprolol tartrate (LOPRESSOR) 25 MG tablet Take 25 mg by mouth 2 (two) times daily. 12/28/22  Yes [provider]  modafinil (PROVIGIL) 100 MG tablet Take 100 mg by mouth daily.   Yes [provider]  NYSTATIN powder Apply 1 Application topically 2 (two) times daily.   Yes [provider]  omeprazole (PRILOSEC) 20 MG capsule Take 20 mg by mouth 2 (two) times daily. 02/19/16  Yes [provider]  pravastatin (PRAVACHOL) 40 MG tablet Take 1 tablet (40 mg total) by mouth daily. 01/28/23  Yes Delfino Lovett, MD  Vitamin D, Ergocalciferol, (DRISDOL) 1.25 MG (50000 UNIT) CAPS capsule Take 50,000 Units by mouth once a week. On Sundays 02/11/23  Yes [provider]  albuterol (VENTOLIN HFA) 108 (90 Base) MCG/ACT inhaler Inhale 2 puffs into the lungs every 6 (six) hours as needed for wheezing or shortness of breath. Patient not taking: Reported on 11/08/2023 03/10/23   Alford Highland, MD  alendronate (FOSAMAX) 70 MG tablet Take 1 tablet (70 mg total) by mouth once a week. Take with a full glass of water on an empty stomach. Patient not taking: Reported on 11/08/2023 03/10/23   Alford Highland, MD  DULoxetine (CYMBALTA) 30 MG capsule Take 30 mg by mouth daily. Patient not taking: Reported on 11/08/2023    [provider]  enoxaparin (LOVENOX) 40 MG/0.4ML injection Inject 0.4 mLs (40 mg total) into the skin daily. Patient not taking: Reported on 11/08/2023 03/10/23   Anson Oregon, PA-C  HYDROcodone-acetaminophen (NORCO/VICODIN) 5-325 MG tablet Take 0.5-1 tablets by mouth every 8 (eight) hours as needed for severe pain. Patient not taking: Reported on 11/08/2023 03/10/23   Anson Oregon, PA-C    Physical Exam: Vitals:   11/08/23 1642 11/08/23 1654 11/08/23 1930  BP: (!) 143/92  128/85  Pulse: 78  83  Resp: 14  16  Temp: 98.2 F (36.8 C)    TempSrc: Oral    SpO2: 96%  99%  Weight:  59 kg   Height:  5\' 3"  (1.6 m)    General: Patient is alert and awake, does not appear to be distressed.  Not oriented to time.  But patient knows that it [birth year].  Patient knows her location. Respiratory exam: Bilateral intravesicular Cardiovascular exam S1-S2 normal Abdomen bowel sounds are normal all quadrant soft nontender Extremities warm without edema.  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 Data Reviewed:  Labs on Admission:  Results for orders placed or performed during the hospital encounter of 11/08/23 (from the past 24 hours)  CBG monitoring, ED     Status: Abnormal   Collection Time: 11/08/23  5:03 PM  Result Value Ref Range   Glucose-Capillary 132 (H) 70 - 99 mg/dL  Protime-INR     Status: None   Collection Time: 11/08/23  7:00 PM  Result Value Ref Range   Prothrombin Time 14.5 11.4 - 15.2 seconds    INR 1.1 0.8 - 1.2  APTT     Status: None   Collection Time: 11/08/23  7:00 PM  Result Value Ref Range   aPTT 35 24 - 36 seconds  CBC     Status: Abnormal   Collection Time: 11/08/23  7:00 PM  Result Value Ref Range   WBC 8.0 4.0 - 10.5 K/uL   RBC 4.38 3.87 - 5.11 MIL/uL   Hemoglobin 11.7 (L) 12.0 - 15.0 g/dL   HCT 16.1 (L) 09.6 - 04.5 %   MCV 81.1 80.0 - 100.0 fL   MCH 26.7 26.0 - 34.0 pg   MCHC 33.0 30.0 - 36.0 g/dL   RDW 40.9 81.1 - 91.4 %   Platelets 321 150 - 400 K/uL   nRBC 0.0 0.0 - 0.2 %  Differential     Status: None   Collection Time: 11/08/23  7:00 PM  Result Value Ref Range   Neutrophils Relative % 82 %   Neutro Abs 6.5 1.7 - 7.7 K/uL   Lymphocytes Relative 13 %   Lymphs Abs 1.0 0.7 - 4.0 K/uL   Monocytes Relative 5 %   Monocytes Absolute 0.4 0.1 - 1.0 K/uL   Eosinophils Relative 0 %   Eosinophils Absolute 0.0 0.0 - 0.5 K/uL   Basophils Relative 0 %   Basophils Absolute 0.0 0.0 - 0.1 K/uL   Immature Granulocytes 0 %   Abs Immature Granulocytes 0.02 0.00 - 0.07 K/uL  Comprehensive metabolic panel     Status: Abnormal   Collection Time: 11/08/23  7:00 PM  Result Value Ref Range   Sodium 124 (L) 135 - 145 mmol/L   Potassium 4.3 3.5 - 5.1 mmol/L   Chloride 92 (L) 98 - 111 mmol/L   CO2 22 22 - 32 mmol/L   Glucose, Bld 111 (H) 70 - 99 mg/dL   BUN 7 (L) 8 - 23 mg/dL   Creatinine, Ser 7.82 0.44 - 1.00 mg/dL   Calcium 9.3 8.9 - 95.6 mg/dL   Total Protein 7.1 6.5 - 8.1 g/dL   Albumin 3.9 3.5 - 5.0 g/dL   AST 20 15 - 41 U/L   ALT 15 0 - 44 U/L   Alkaline Phosphatase 95 38 - 126 U/L   Total  Bilirubin 0.4 0.0 - 1.2 mg/dL   GFR, Estimated >81 >19 mL/min   Anion gap 10 5 - 15  I-stat chem 8, ED     Status: Abnormal   Collection Time: 11/08/23  7:10 PM  Result Value Ref Range   Sodium 126 (L) 135 - 145 mmol/L   Potassium 4.3 3.5 - 5.1 mmol/L   Chloride 92 (L) 98 - 111 mmol/L   BUN 7 (L) 8 - 23 mg/dL   Creatinine, Ser 1.47 0.44 - 1.00 mg/dL   Glucose, Bld 829 (H)  70 - 99 mg/dL   Calcium, Ion 5.62 1.30 - 1.40 mmol/L   TCO2 25 22 - 32 mmol/L   Hemoglobin 12.9 12.0 - 15.0 g/dL   HCT 86.5 78.4 - 69.6 %  Resp panel by RT-PCR (RSV, Flu A&B, Covid) Anterior Nasal Swab     Status: None   Collection Time: 11/08/23  7:58 PM   Specimen: Anterior Nasal Swab  Result Value Ref Range   SARS Coronavirus 2 by RT PCR NEGATIVE NEGATIVE   Influenza A by PCR NEGATIVE NEGATIVE   Influenza B by PCR NEGATIVE NEGATIVE   Resp Syncytial Virus by PCR NEGATIVE NEGATIVE   Basic Metabolic Panel: Recent Labs  Lab 11/08/23 1900 11/08/23 1910  NA 124* 126*  K 4.3 4.3  CL 92* 92*  CO2 22  --   GLUCOSE 111* 111*  BUN 7* 7*  CREATININE 0.85 0.80  CALCIUM 9.3  --    Liver Function Tests: Recent Labs  Lab 11/08/23 1900  AST 20  ALT 15  ALKPHOS 95  BILITOT 0.4  PROT 7.1  ALBUMIN 3.9   No results for input(s): "LIPASE", "AMYLASE" in the last 168 hours. No results for input(s): "AMMONIA" in the last 168 hours. CBC: Recent Labs  Lab 11/08/23 1900 11/08/23 1910  WBC 8.0  --   NEUTROABS 6.5  --   HGB 11.7* 12.9  HCT 35.5* 38.0  MCV 81.1  --   PLT 321  --    Cardiac Enzymes: No results for input(s): "CKTOTAL", "CKMB", "CKMBINDEX", "TROPONINIHS" in the last 168 hours.  BNP (last 3 results) No results for input(s): "PROBNP" in the last 8760 hours. CBG: Recent Labs  Lab 11/08/23 1703  GLUCAP 132*    Radiological Exams on Admission:  CT HEAD WO CONTRAST Result Date: 11/08/2023 CLINICAL DATA:  Acute stroke suspected. EXAM: CT HEAD WITHOUT CONTRAST TECHNIQUE: Contiguous axial images were obtained from the base of the skull through the vertex without intravenous contrast. RADIATION DOSE REDUCTION: This exam was performed according to the departmental dose-optimization program which includes automated exposure control, adjustment of the mA and/or kV according to patient size and/or use of iterative reconstruction technique. COMPARISON:  MRI head 07/29/2023  FINDINGS: Brain: No evidence of acute infarction, hemorrhage, hydrocephalus, extra-axial collection or mass lesion/mass effect. There is mild diffuse atrophy. There is moderate periventricular white matter hypodensity, likely chronic small vessel ischemic change. Vascular: Atherosclerotic calcifications are present within the cavernous internal carotid arteries. Skull: Normal. Negative for fracture or focal lesion. Sinuses/Orbits: No acute finding. Other: None. IMPRESSION: 1. No acute intracranial process. 2. Mild diffuse atrophy and moderate chronic small vessel ischemic change. Electronically Signed   By: Darliss Cheney M.D.   On: 11/08/2023 19:07   DG Chest Port 1 View Result Date: 11/08/2023 CLINICAL DATA:  Altered mental status. EXAM: PORTABLE CHEST 1 VIEW COMPARISON:  03/10/2023 x-ray FINDINGS: Chronic deformity of the left humeral head which could be posttraumatic. No  consolidation, pneumothorax or effusion. Normal cardiopericardial silhouette without edema. Overlapping cardiac leads. Film is slightly rotated to the right. IMPRESSION: No acute cardiopulmonary disease. Electronically Signed   By: Karen Kays M.D.   On: 11/08/2023 18:19      Assessment and Plan: * Neurologic abnormality Patient had chronic dementia.  Patient definitely has acute illness going on in terms of gastroenteritis with associated worsening mentation as reported by family in terms of inability to recognize family members.  Patient is currently at baseline of her mentation.  There is report of patient having left/right facial droop transiently.  That resolved before EMS got there.  Currently patient is noted to have some left lower extremity weakness, however I am advised that this is chronic.  And patient does use a bedside commode due to her gait instability.  Given the overall picture my impression is that the patient had acute metabolic encephalopathy.  However stroke/TIA cannot be ruled out 100%.  Therefore I did offer MRI  to the patient.  However patient is not interested in getting an MRI.  She wants to get out of the hospital as soon as possible if possible tonight.  T therefore we will continue to monitor this patient neurologic status.  Hyponatremia Pending serum osmolality to confirm that this is true hyponatremia.  However this seems to be chronic moderate and unlikely to be symptomatic.  Pending urine sodium.  Given recent history of diarrhea and vomiting I suspect this is prerenal.  Patient already started on saline infusion in the ER.  We will trend basic metabolic tonight. TSH pending. Will cehck cortisol in AM  Hold losartan  Gastroenteritis Started yesterday with 3 episodes of diarrhea continuing today with more episodes of vomiting.  I will check abdomen panel as well as lipase.  No white count no fever patient looking nontoxic and not complaining of abdominal abdominal pain.  Diarrhea has currently remitted today.  Therefore I will treat this patient symptomatically with Zofran as needed and monitor clinically.  Empiric pantoprazole also ordered. Cehck troponin  Dementia with behavioral disturbance (HCC) Pristiq changed to effexor Xr C.w. modafinil  Hypertension C.w. metoprolol and diltiazem. Restart losartan in AM based on creatiine, BP and sodium  Hypothyroidism C.w. synthroid  Dyslipidemia C/w pravastatin      Advance Care Planning:   Code Status: Prior full code per daughter.  Consults: none   Family Communication: daughter at bedside. All question answered.  Severity of Illness: The appropriate patient status for this patient is OBSERVATION. Observation status is judged to be reasonable and necessary in order to provide the required intensity of service to ensure the patient's safety. The patient's presenting symptoms, physical exam findings, and initial radiographic and laboratory data in the context of their medical condition is felt to place them at decreased risk for further  clinical deterioration. Furthermore, it is anticipated that the patient will be medically stable for discharge from the hospital within 2 midnights of admission.   Author: Nolberto Hanlon, MD 11/08/2023 9:51 PM  For on call review [email or web address].

## 2023-11-08 NOTE — ED Notes (Signed)
Patient transported to X-ray

## 2023-11-08 NOTE — Assessment & Plan Note (Addendum)
Pristiq changed to effexor Xr C.w. modafinil

## 2023-11-09 DIAGNOSIS — R29818 Other symptoms and signs involving the nervous system: Secondary | ICD-10-CM

## 2023-11-09 LAB — CBC
HCT: 33.1 % — ABNORMAL LOW (ref 36.0–46.0)
Hemoglobin: 10.7 g/dL — ABNORMAL LOW (ref 12.0–15.0)
MCH: 26.4 pg (ref 26.0–34.0)
MCHC: 32.3 g/dL (ref 30.0–36.0)
MCV: 81.5 fL (ref 80.0–100.0)
Platelets: 284 10*3/uL (ref 150–400)
RBC: 4.06 MIL/uL (ref 3.87–5.11)
RDW: 15.5 % (ref 11.5–15.5)
WBC: 6.4 10*3/uL (ref 4.0–10.5)
nRBC: 0 % (ref 0.0–0.2)

## 2023-11-09 LAB — BASIC METABOLIC PANEL
Anion gap: 9 (ref 5–15)
Anion gap: 8 (ref 5–15)
BUN: 6 mg/dL — ABNORMAL LOW (ref 8–23)
BUN: <5 mg/dL — ABNORMAL LOW (ref 8–23)
CO2: 20 mmol/L — ABNORMAL LOW (ref 22–32)
CO2: 24 mmol/L (ref 22–32)
Calcium: 9 mg/dL (ref 8.9–10.3)
Calcium: 8.9 mg/dL (ref 8.9–10.3)
Chloride: 97 mmol/L — ABNORMAL LOW (ref 98–111)
Chloride: 98 mmol/L (ref 98–111)
Creatinine, Ser: 0.75 mg/dL (ref 0.44–1.00)
Creatinine, Ser: 0.81 mg/dL (ref 0.44–1.00)
GFR, Estimated: >60 mL/min (ref >60)
GFR, Estimated: >60 mL/min (ref >60)
Glucose, Bld: 102 mg/dL — ABNORMAL HIGH (ref 70–99)
Glucose, Bld: 97 mg/dL (ref 70–99)
Potassium: 4.2 mmol/L (ref 3.5–5.1)
Potassium: 4.1 mmol/L (ref 3.5–5.1)
Sodium: 126 mmol/L — ABNORMAL LOW (ref 135–145)
Sodium: 130 mmol/L — ABNORMAL LOW (ref 135–145)

## 2023-11-09 LAB — OSMOLALITY: Osmolality: 269 mosm/kg — ABNORMAL LOW (ref 275–295)

## 2023-11-09 LAB — PROTIME-INR
INR: 1.1 (ref 0.8–1.2)
Prothrombin Time: 14.5 s (ref 11.4–15.2)

## 2023-11-09 LAB — APTT: aPTT: 38 s — ABNORMAL HIGH (ref 24–36)

## 2023-11-09 LAB — LIPASE, BLOOD: Lipase: 29 U/L (ref 11–51)

## 2023-11-09 LAB — CORTISOL-AM, BLOOD: Cortisol - AM: 4.5 ug/dL — ABNORMAL LOW (ref 6.7–22.6)

## 2023-11-09 LAB — TROPONIN I (HIGH SENSITIVITY): Troponin I (High Sensitivity): 5 ng/L (ref <18)

## 2023-11-09 MED ORDER — SODIUM CHLORIDE 1 G PO TABS: 1.0000 g | ORAL_TABLET | Freq: Two times a day (BID) | ORAL | 0 refills | Status: DC

## 2023-11-09 NOTE — ED Notes (Signed)
Pt and daughter understood d/c instructions. Pts IV d/c and tolerated well. Pt was dressed and escorted by wheelchair to go home by private vehicle with daughter

## 2023-11-09 NOTE — Care Management (Signed)
Transition of Care [facility]) - Inpatient Brief Assessment   Patient Details  Name: Charlene Thomas MRN: [id number] Date of Birth: March 23, 1946  [facility] of [facility]) CM/SW Contact:    Lockie Pares, RN Phone Number: 11/09/2023, 9:40 AM   Clinical Narrative:  Patient from home TIA hyponatremia. Her daughter lives with her. She uses a 4 pronged cane for mobility. She had previous home health services with [facility], and would want them back again should she need home health. PT and OT to evaluate and give recommendations. TOC will follow for needs, recommendations, and transitions of care  Transition of Care Asessment: Insurance and Status: Insurance coverage has been reviewed Patient has primary care physician: Yes Home environment has been reviewed: Lives with daughter Prior level of function:: Uses 4 prong cane Prior/Current Home Services: No current home services Social Drivers of Health Review: SDOH reviewed no interventions necessary Readmission risk has been reviewed: Yes Transition of care needs: transition of care needs identified, TOC will continue to follow

## 2023-11-09 NOTE — Discharge Summary (Signed)
PROGRESS NOTE  Charlene Thomas  DOB: 1946-06-03  PCP: Marguarite Arbour, MD ZOX:[id number]  DOA: 11/08/2023  LOS: 0 days  Hospital Day: 2  Brief narrative: Charlene Thomas is a 78 y.o. female with PMH significant for dementia, HTN, palpitations, hypothyroidism, GERD, arthritis. 1/24, patient was brought to the ED with complaint of 24-hour nausea, vomiting, diarrhea, altered mental status. The day prior, patient developed diarrhea without bleeding, followed by vomiting.  In the next several hours, she had 2-3 episodes of diarrhea and few episodes of vomiting, became weak and confused and hence brought to the ED. Family also reported facial droop which had resolved by the time of EMS presentation. Also reported a fall last week.  In the ED, afebrile, hemodynamically stable. Labs with WBC count 8, hemoglobin 11.7, sodium 124, Respiratory virus panel unremarkable CT head without any acute intracranial abnormality, showed mild diffuse atrophy and moderate chronic small vessel ischemic disease. Admitted to [facility] for further workup.  Subjective: Patient was seen and examined this morning.  Pleasant elderly Caucasian female.  Propped up in bed.  Not in distress.  Daughter at bedside. No nausea vomiting diarrhea since presentation.  Mental status close to baseline.  Patient and daughter both feel ready for discharge today. Chart reviewed. Remains hemodynamically stable Most recent labs from this morning with sodium up to 130, cortisol low at 4.5.  Assessment and plan: Acute gastroenteritis Presented with about 24 elevations of nausea, vomiting, diarrhea leading to weakness No fever, WBC count normal It seems her symptoms is gradually improving Not on antibiotics Continue PPI, continue IV Zofran as needed Adequate IV hydration provided Recent Labs  Lab 11/08/23 1900 11/08/23 2159 11/09/23 0402  WBC 8.0 7.5 6.4   Acute metabolic encephalopathy Underlying dementia with behavioral  disturbance On sedation, had altered mental status.  Family also reported facial droop which had resolved by the time of EMS presentation. Mental status close to baseline PTA meds- desvenlafaxine 50 mg daily, modafinil 100 mg daily, baclofen PRN   Hyponatremia Sodium level was low at 124, still with low serum osmolality of 269.   Likely prerenal in the setting of fluid loss.  Improving sodium level with IV hydration.   Salt tablet for 3 days at discharge.  Follow-up with PCP as an outpatient for blood work Recent Labs  Lab 11/08/23 1900 11/08/23 1910 11/08/23 2159 11/09/23 0402  NA 124* 126* 126* 130*   Hypertension PTA meds- metoprolol 25 mg twice daily, Cardizem 240 mg daily, losartan 100 mg daily Continue all   Hypothyroidism Continue synthroid   Dyslipidemia Continue pravastatin  GERD PPI  Goals of care   Code Status: Full Code     Diet:  Diet Order             Diet regular Room service appropriate? Yes; Fluid consistency: Thin  Diet effective now           Diet general                   Nutritional status:  Body mass index is 23.03 kg/m.       Wounds:  -    Discharge Exam:   Vitals:   11/09/23 0825 11/09/23 0953 11/09/23 0959 11/09/23 1011  BP:   120/76 120/76  Pulse:  75 77 75  Resp:  15  20  Temp: 98.5 F (36.9 C)     TempSrc: Oral     SpO2:  98%  98%  Weight:  Height:        Body mass index is 23.03 kg/m.  General exam: Pleasant, elderly Caucasian female. Skin: No rashes, lesions or ulcers. HEENT: Atraumatic, normocephalic, no obvious bleeding Lungs: Clear to auscultation bilaterally,  CVS: S1, S2, no murmur,   GI/Abd: Soft, nontender, nondistended, bowel sound present,   CNS: Alert, awake, oriented to place and person.  Baseline dementia Psychiatry: Mood appropriate, not restless or agitated Extremities: No pedal edema, no calf tenderness,   Follow ups:    Follow-up Information     Sparks, Duane Lope, MD Follow  up.   Specialty: Internal Medicine Contact information: [address] [place] [address] [phone]                 Discharge Instructions:   Discharge Instructions     Call MD for:  difficulty breathing, headache or visual disturbances   Complete by: As directed    Call MD for:  extreme fatigue   Complete by: As directed    Call MD for:  hives   Complete by: As directed    Call MD for:  persistant dizziness or light-headedness   Complete by: As directed    Call MD for:  persistant nausea and vomiting   Complete by: As directed    Call MD for:  severe uncontrolled pain   Complete by: As directed    Call MD for:  temperature >100.4   Complete by: As directed    Diet general   Complete by: As directed    Discharge instructions   Complete by: As directed    General discharge instructions: Follow with Primary MD Marguarite Arbour, MD in 7 days  Please request your PCP  to go over your hospital tests, procedures, radiology results at the follow up. Please get your medicines reviewed and adjusted.  Your PCP may decide to repeat certain labs or tests as needed. Do not drive, operate heavy machinery, perform activities at heights, swimming or participation in water activities or provide baby sitting services if your were admitted for syncope or siezures until you have seen by Primary MD or a Neurologist and advised to do so again. [place] Controlled Substance Reporting System database was reviewed. Do not drive, operate heavy machinery, perform activities at heights, swim, participate in water activities or provide baby-sitting services while on medications for pain, sleep and mood until your outpatient physician has reevaluated you and advised to do so again.  You are strongly recommended to comply with the dose, frequency and duration of prescribed medications. Activity: As tolerated with Full fall precautions use walker/cane & assistance as needed Avoid using  any recreational substances like cigarette, tobacco, alcohol, or non-prescribed drug. If you experience worsening of your admission symptoms, develop shortness of breath, life threatening emergency, suicidal or homicidal thoughts you must seek medical attention immediately by calling 911 or calling your MD immediately  if symptoms less severe. You must read complete instructions/literature along with all the possible adverse reactions/side effects for all the medicines you take and that have been prescribed to you. Take any new medicine only after you have completely understood and accepted all the possible adverse reactions/side effects.  Wear Seat belts while driving. You were cared for by a hospitalist during your hospital stay. If you have any questions about your discharge medications or the care you received while you were in the hospital after you are discharged, you can call the unit and ask to speak with the hospitalist  or the covering physician. Once you are discharged, your primary care physician will handle any further medical issues. Please note that NO REFILLS for any discharge medications will be authorized once you are discharged, as it is imperative that you return to your primary care physician (or establish a relationship with a primary care physician if you do not have one).   Increase activity slowly   Complete by: As directed        Discharge Medications:   Allergies as of 11/09/2023       Reactions   Topiramate Other (See Comments)   Causes severe confusion   Aspirin    Celecoxib Other (See Comments)   Gi upset   Ezetimibe-simvastatin Other (See Comments)   Other Reaction(s): Muscle Pain   Mobic [meloxicam] Itching   Raloxifene Other (See Comments)   Other Reaction(s): Unknown Upset gi        Medication List     STOP taking these medications    alendronate 70 MG tablet Commonly known as: Fosamax   DULoxetine 30 MG capsule Commonly known as: CYMBALTA    enoxaparin 40 MG/0.4ML injection Commonly known as: LOVENOX   HYDROcodone-acetaminophen 5-325 MG tablet Commonly known as: NORCO/VICODIN   ibuprofen 400 MG tablet Commonly known as: ADVIL       TAKE these medications    acetaminophen 500 MG tablet Commonly known as: TYLENOL Take 1-2 tablets (500-1,000 mg total) by mouth every 6 (six) hours as needed for mild pain.   albuterol 108 (90 Base) MCG/ACT inhaler Commonly known as: VENTOLIN HFA Inhale 2 puffs into the lungs every 6 (six) hours as needed for wheezing or shortness of breath.   baclofen 20 MG tablet Commonly known as: LIORESAL Take 20 mg by mouth 2 (two) times daily.   calcium carbonate 600 MG Tabs tablet Commonly known as: Calcium 600 Take 1 tablet (600 mg total) by mouth 2 (two) times daily with a meal.   cetirizine 10 MG tablet Commonly known as: ZYRTEC Take 10 mg by mouth daily.   desvenlafaxine 50 MG 24 hr tablet Commonly known as: PRISTIQ Take 50 mg by mouth daily.   diltiazem 240 MG 24 hr capsule Commonly known as: CARDIZEM CD Take 240 mg by mouth daily.   levothyroxine 125 MCG tablet Commonly known as: SYNTHROID Take 0.5 tablets by mouth daily.   losartan 100 MG tablet Commonly known as: COZAAR Take 100 mg by mouth daily.   metoprolol tartrate 25 MG tablet Commonly known as: LOPRESSOR Take 25 mg by mouth 2 (two) times daily.   modafinil 100 MG tablet Commonly known as: PROVIGIL Take 100 mg by mouth daily.   nystatin powder Generic drug: nystatin Apply 1 Application topically 2 (two) times daily.   omeprazole 20 MG capsule Commonly known as: PRILOSEC Take 20 mg by mouth 2 (two) times daily.   pravastatin 40 MG tablet Commonly known as: PRAVACHOL Take 1 tablet (40 mg total) by mouth daily.   sodium chloride 1 g tablet Take 1 tablet (1 g total) by mouth 2 (two) times daily with a meal for 3 days.   Vitamin D (Ergocalciferol) 1.25 MG (50000 UNIT) Caps capsule Commonly known as:  DRISDOL Take 50,000 Units by mouth once a week. On Sundays         The results of significant diagnostics from this hospitalization (including imaging, microbiology, ancillary and laboratory) are listed below for reference.    Procedures and Diagnostic Studies:   DG Abd 2 Views Result Date:  11/08/2023 CLINICAL DATA:  Vomiting EXAM: ABDOMEN - 2 VIEW COMPARISON:  CT abdomen and pelvis 01/26/2023 FINDINGS: Scattered gas and stool in the colon. No small or large bowel distention. No abnormal air-fluid levels. No free intra-abdominal air. No radiopaque stones. Degenerative changes in the lumbar spine. Postoperative screw fixation of the left femoral neck. Visualized soft tissue contours appear intact. Visualized lung bases are clear. IMPRESSION: Normal nonobstructive bowel gas pattern. Electronically Signed   By: Burman Nieves M.D.   On: 11/08/2023 22:26   CT HEAD WO CONTRAST Result Date: 11/08/2023 CLINICAL DATA:  Acute stroke suspected. EXAM: CT HEAD WITHOUT CONTRAST TECHNIQUE: Contiguous axial images were obtained from the base of the skull through the vertex without intravenous contrast. RADIATION DOSE REDUCTION: This exam was performed according to the departmental dose-optimization program which includes automated exposure control, adjustment of the mA and/or kV according to patient size and/or use of iterative reconstruction technique. COMPARISON:  MRI head 07/29/2023 FINDINGS: Brain: No evidence of acute infarction, hemorrhage, hydrocephalus, extra-axial collection or mass lesion/mass effect. There is mild diffuse atrophy. There is moderate periventricular white matter hypodensity, likely chronic small vessel ischemic change. Vascular: Atherosclerotic calcifications are present within the cavernous internal carotid arteries. Skull: Normal. Negative for fracture or focal lesion. Sinuses/Orbits: No acute finding. Other: None. IMPRESSION: 1. No acute intracranial process. 2. Mild diffuse atrophy  and moderate chronic small vessel ischemic change. Electronically Signed   By: Darliss Cheney M.D.   On: 11/08/2023 19:07   DG Chest Port 1 View Result Date: 11/08/2023 CLINICAL DATA:  Altered mental status. EXAM: PORTABLE CHEST 1 VIEW COMPARISON:  03/10/2023 x-ray FINDINGS: Chronic deformity of the left humeral head which could be posttraumatic. No consolidation, pneumothorax or effusion. Normal cardiopericardial silhouette without edema. Overlapping cardiac leads. Film is slightly rotated to the right. IMPRESSION: No acute cardiopulmonary disease. Electronically Signed   By: Karen Kays M.D.   On: 11/08/2023 18:19     Labs:   Basic Metabolic Panel: Recent Labs  Lab 11/08/23 1900 11/08/23 1910 11/08/23 2159 11/09/23 0402  NA 124* 126* 126* 130*  K 4.3 4.3 4.2 4.1  CL 92* 92* 97* 98  CO2 22  --  20* 24  GLUCOSE 111* 111* 102* 97  BUN 7* 7* 6* <5*  CREATININE 0.85 0.80 0.75 0.81  CALCIUM 9.3  --  9.0 8.9   GFR Estimated Creatinine Clearance: 45.8 mL/min (by C-G formula based on SCr of 0.81 mg/dL). Liver Function Tests: Recent Labs  Lab 11/08/23 1900  AST 20  ALT 15  ALKPHOS 95  BILITOT 0.4  PROT 7.1  ALBUMIN 3.9   Recent Labs  Lab 11/08/23 2159  LIPASE 29   No results for input(s): "AMMONIA" in the last 168 hours. Coagulation profile Recent Labs  Lab 11/08/23 1900 11/09/23 0402  INR 1.1 1.1    CBC: Recent Labs  Lab 11/08/23 1900 11/08/23 1910 11/08/23 2159 11/09/23 0402  WBC 8.0  --  7.5 6.4  NEUTROABS 6.5  --   --   --   HGB 11.7* 12.9 11.2* 10.7*  HCT 35.5* 38.0 34.6* 33.1*  MCV 81.1  --  81.6 81.5  PLT 321  --  261 284   Cardiac Enzymes: No results for input(s): "CKTOTAL", "CKMB", "CKMBINDEX", "TROPONINI" in the last 168 hours. BNP: Invalid input(s): "POCBNP" CBG: Recent Labs  Lab 11/08/23 1703  GLUCAP 132*   D-Dimer No results for input(s): "DDIMER" in the last 72 hours. Hgb A1c No results for input(s): "HGBA1C"  in the last 72  hours. Lipid Profile No results for input(s): "CHOL", "HDL", "LDLCALC", "TRIG", "CHOLHDL", "LDLDIRECT" in the last 72 hours. Thyroid function studies Recent Labs    11/08/23 2149  TSH 1.764   Anemia work up No results for input(s): "VITAMINB12", "FOLATE", "FERRITIN", "TIBC", "IRON", "RETICCTPCT" in the last 72 hours. Microbiology Recent Results (from the past 240 hours)  Resp panel by RT-PCR (RSV, Flu A&B, Covid) Anterior Nasal Swab     Status: None   Collection Time: 11/08/23  7:58 PM   Specimen: Anterior Nasal Swab  Result Value Ref Range Status   SARS Coronavirus 2 by RT PCR NEGATIVE NEGATIVE Final   Influenza A by PCR NEGATIVE NEGATIVE Final   Influenza B by PCR NEGATIVE NEGATIVE Final    Comment: (NOTE) The Xpert Xpress SARS-CoV-2/FLU/RSV plus assay is intended as an aid in the diagnosis of influenza from Nasopharyngeal swab specimens and should not be used as a sole basis for treatment. Nasal washings and aspirates are unacceptable for Xpert Xpress SARS-CoV-2/FLU/RSV testing.  Fact Sheet for Patients: BloggerCourse.com  Fact Sheet for Healthcare Providers: SeriousBroker.it  This test is not yet approved or cleared by the [place] FDA and has been authorized for detection and/or diagnosis of SARS-CoV-2 by FDA under an Emergency Use Authorization (EUA). This EUA will remain in effect (meaning this test can be used) for the duration of the COVID-19 declaration under Section 564(b)(1) of the Act, 21 U.S.C. section 360bbb-3(b)(1), unless the authorization is terminated or revoked.     Resp Syncytial Virus by PCR NEGATIVE NEGATIVE Final    Comment: (NOTE) Fact Sheet for Patients: BloggerCourse.com  Fact Sheet for Healthcare Providers: SeriousBroker.it  This test is not yet approved or cleared by the [place] FDA and has been authorized for detection and/or  diagnosis of SARS-CoV-2 by FDA under an Emergency Use Authorization (EUA). This EUA will remain in effect (meaning this test can be used) for the duration of the COVID-19 declaration under Section 564(b)(1) of the Act, 21 U.S.C. section 360bbb-3(b)(1), unless the authorization is terminated or revoked.  Performed at [facility], [address] [place], [place] [address]     Time coordinating discharge: 45 minutes  Signed: Square Jowett  [facility] 11/09/2023, 11:57 AM

## 2023-11-09 NOTE — Care Management Obs Status (Signed)
MEDICARE OBSERVATION STATUS NOTIFICATION   Patient Details  Name: Charlene Thomas MRN: [id number] Date of Birth: 02/18/1946   Medicare Observation Status Notification Given:  Yes  I Gilda Crease RNCM spoke with patient at the bedside to explain the [facility] letter. The receiver verbalized understanding and is in agreement to signature; a copy will be presented to the patient  Lockie Pares, RN 11/09/2023, 9:30 AM

## 2023-11-11 ENCOUNTER — Encounter: Payer: Self-pay | Admitting: *Deleted

## 2023-11-12 ENCOUNTER — Encounter (HOSPITAL_COMMUNITY): Payer: Self-pay

## 2023-11-12 ENCOUNTER — Telehealth: Payer: Self-pay | Admitting: Diagnostic Neuroimaging

## 2023-11-12 ENCOUNTER — Other Ambulatory Visit: Payer: Self-pay

## 2023-11-12 ENCOUNTER — Observation Stay (HOSPITAL_COMMUNITY): Payer: Medicare HMO

## 2023-11-12 ENCOUNTER — Emergency Department (HOSPITAL_COMMUNITY): Payer: Medicare HMO

## 2023-11-12 ENCOUNTER — Ambulatory Visit: Payer: Medicare HMO | Admitting: Diagnostic Neuroimaging

## 2023-11-12 ENCOUNTER — Inpatient Hospital Stay (HOSPITAL_COMMUNITY)
Admission: EM | Admit: 2023-11-12 | Discharge: 2023-11-17 | DRG: 092 | Disposition: A | Discharge status: Home / Self Care | Payer: Medicare HMO | Attending: Internal Medicine | Admitting: Internal Medicine

## 2023-11-12 DIAGNOSIS — R1111 Vomiting without nausea: Secondary | ICD-10-CM | POA: Diagnosis not present

## 2023-11-12 DIAGNOSIS — R5381 Other malaise: Secondary | ICD-10-CM | POA: Diagnosis present

## 2023-11-12 DIAGNOSIS — Z87442 Personal history of urinary calculi: Secondary | ICD-10-CM | POA: Diagnosis not present

## 2023-11-12 DIAGNOSIS — Z961 Presence of intraocular lens: Secondary | ICD-10-CM | POA: Diagnosis present

## 2023-11-12 DIAGNOSIS — Z90710 Acquired absence of both cervix and uterus: Secondary | ICD-10-CM | POA: Diagnosis not present

## 2023-11-12 DIAGNOSIS — A084 Viral intestinal infection, unspecified: Secondary | ICD-10-CM | POA: Diagnosis present

## 2023-11-12 DIAGNOSIS — R4182 Altered mental status, unspecified: Secondary | ICD-10-CM

## 2023-11-12 DIAGNOSIS — Z79890 Hormone replacement therapy (postmenopausal): Secondary | ICD-10-CM

## 2023-11-12 DIAGNOSIS — E039 Hypothyroidism, unspecified: Secondary | ICD-10-CM | POA: Diagnosis present

## 2023-11-12 DIAGNOSIS — Z79899 Other long term (current) drug therapy: Secondary | ICD-10-CM

## 2023-11-12 DIAGNOSIS — G9340 Encephalopathy, unspecified: Secondary | ICD-10-CM | POA: Diagnosis present

## 2023-11-12 DIAGNOSIS — Z9842 Cataract extraction status, left eye: Secondary | ICD-10-CM | POA: Diagnosis not present

## 2023-11-12 DIAGNOSIS — G319 Degenerative disease of nervous system, unspecified: Secondary | ICD-10-CM | POA: Diagnosis not present

## 2023-11-12 DIAGNOSIS — T428X5A Adverse effect of antiparkinsonism drugs and other central muscle-tone depressants, initial encounter: Secondary | ICD-10-CM | POA: Diagnosis present

## 2023-11-12 DIAGNOSIS — R112 Nausea with vomiting, unspecified: Secondary | ICD-10-CM | POA: Insufficient documentation

## 2023-11-12 DIAGNOSIS — Z9841 Cataract extraction status, right eye: Secondary | ICD-10-CM

## 2023-11-12 DIAGNOSIS — F03918 Unspecified dementia, unspecified severity, with other behavioral disturbance: Secondary | ICD-10-CM | POA: Diagnosis present

## 2023-11-12 DIAGNOSIS — K219 Gastro-esophageal reflux disease without esophagitis: Secondary | ICD-10-CM | POA: Diagnosis present

## 2023-11-12 DIAGNOSIS — E871 Hypo-osmolality and hyponatremia: Secondary | ICD-10-CM | POA: Diagnosis present

## 2023-11-12 DIAGNOSIS — A0811 Acute gastroenteropathy due to Norwalk agent: Secondary | ICD-10-CM | POA: Diagnosis present

## 2023-11-12 DIAGNOSIS — E876 Hypokalemia: Secondary | ICD-10-CM | POA: Diagnosis present

## 2023-11-12 DIAGNOSIS — Z87891 Personal history of nicotine dependence: Secondary | ICD-10-CM

## 2023-11-12 DIAGNOSIS — Z1152 Encounter for screening for COVID-19: Secondary | ICD-10-CM | POA: Diagnosis not present

## 2023-11-12 DIAGNOSIS — R159 Full incontinence of feces: Secondary | ICD-10-CM | POA: Diagnosis present

## 2023-11-12 DIAGNOSIS — T43695A Adverse effect of other psychostimulants, initial encounter: Secondary | ICD-10-CM | POA: Diagnosis present

## 2023-11-12 DIAGNOSIS — Z886 Allergy status to analgesic agent status: Secondary | ICD-10-CM | POA: Diagnosis not present

## 2023-11-12 DIAGNOSIS — T43215A Adverse effect of selective serotonin and norepinephrine reuptake inhibitors, initial encounter: Secondary | ICD-10-CM | POA: Diagnosis present

## 2023-11-12 DIAGNOSIS — R7989 Other specified abnormal findings of blood chemistry: Secondary | ICD-10-CM | POA: Insufficient documentation

## 2023-11-12 DIAGNOSIS — Y92009 Unspecified place in unspecified non-institutional (private) residence as the place of occurrence of the external cause: Secondary | ICD-10-CM | POA: Diagnosis not present

## 2023-11-12 DIAGNOSIS — I4891 Unspecified atrial fibrillation: Secondary | ICD-10-CM | POA: Diagnosis not present

## 2023-11-12 DIAGNOSIS — K56609 Unspecified intestinal obstruction, unspecified as to partial versus complete obstruction: Secondary | ICD-10-CM | POA: Diagnosis not present

## 2023-11-12 DIAGNOSIS — I672 Cerebral atherosclerosis: Secondary | ICD-10-CM | POA: Diagnosis not present

## 2023-11-12 DIAGNOSIS — Z9049 Acquired absence of other specified parts of digestive tract: Secondary | ICD-10-CM

## 2023-11-12 DIAGNOSIS — E785 Hyperlipidemia, unspecified: Secondary | ICD-10-CM | POA: Diagnosis present

## 2023-11-12 DIAGNOSIS — G928 Other toxic encephalopathy: Principal | ICD-10-CM | POA: Diagnosis present

## 2023-11-12 DIAGNOSIS — K5730 Diverticulosis of large intestine without perforation or abscess without bleeding: Secondary | ICD-10-CM | POA: Diagnosis not present

## 2023-11-12 DIAGNOSIS — I10 Essential (primary) hypertension: Secondary | ICD-10-CM | POA: Diagnosis present

## 2023-11-12 DIAGNOSIS — I771 Stricture of artery: Secondary | ICD-10-CM | POA: Diagnosis not present

## 2023-11-12 DIAGNOSIS — Z888 Allergy status to other drugs, medicaments and biological substances status: Secondary | ICD-10-CM

## 2023-11-12 DIAGNOSIS — Z471 Aftercare following joint replacement surgery: Secondary | ICD-10-CM | POA: Diagnosis not present

## 2023-11-12 DIAGNOSIS — R531 Weakness: Secondary | ICD-10-CM | POA: Diagnosis not present

## 2023-11-12 LAB — CBC WITH DIFFERENTIAL/PLATELET
Abs Immature Granulocytes: 0.04 10*3/uL (ref 0.00–0.07)
Basophils Absolute: 0 10*3/uL (ref 0.0–0.1)
Basophils Relative: 0 %
Eosinophils Absolute: 0 10*3/uL (ref 0.0–0.5)
Eosinophils Relative: 0 %
HCT: 33.8 % — ABNORMAL LOW (ref 36.0–46.0)
Hemoglobin: 11.2 g/dL — ABNORMAL LOW (ref 12.0–15.0)
Immature Granulocytes: 1 %
Lymphocytes Relative: 4 %
Lymphs Abs: 0.4 10*3/uL — ABNORMAL LOW (ref 0.7–4.0)
MCH: 27.2 pg (ref 26.0–34.0)
MCHC: 33.1 g/dL (ref 30.0–36.0)
MCV: 82 fL (ref 80.0–100.0)
Monocytes Absolute: 0.3 10*3/uL (ref 0.1–1.0)
Monocytes Relative: 4 %
Neutro Abs: 7.5 10*3/uL (ref 1.7–7.7)
Neutrophils Relative %: 91 %
Platelets: 243 10*3/uL (ref 150–400)
RBC: 4.12 MIL/uL (ref 3.87–5.11)
RDW: 15.8 % — ABNORMAL HIGH (ref 11.5–15.5)
WBC: 8.2 10*3/uL (ref 4.0–10.5)
nRBC: 0 % (ref 0.0–0.2)

## 2023-11-12 LAB — URINALYSIS, ROUTINE W REFLEX MICROSCOPIC
Bilirubin Urine: NEGATIVE
Glucose, UA: NEGATIVE mg/dL
Hgb urine dipstick: NEGATIVE
Ketones, ur: NEGATIVE mg/dL
Nitrite: NEGATIVE
Protein, ur: NEGATIVE mg/dL
Specific Gravity, Urine: 1.018 (ref 1.005–1.030)
pH: 5 (ref 5.0–8.0)

## 2023-11-12 LAB — COMPREHENSIVE METABOLIC PANEL
ALT: 14 U/L (ref 0–44)
AST: 25 U/L (ref 15–41)
Albumin: 3.5 g/dL (ref 3.5–5.0)
Alkaline Phosphatase: 90 U/L (ref 38–126)
Anion gap: 14 (ref 5–15)
BUN: 7 mg/dL — ABNORMAL LOW (ref 8–23)
CO2: 23 mmol/L (ref 22–32)
Calcium: 8.9 mg/dL (ref 8.9–10.3)
Chloride: 94 mmol/L — ABNORMAL LOW (ref 98–111)
Creatinine, Ser: 0.79 mg/dL (ref 0.44–1.00)
GFR, Estimated: >60 mL/min (ref >60)
Glucose, Bld: 108 mg/dL — ABNORMAL HIGH (ref 70–99)
Potassium: 3.6 mmol/L (ref 3.5–5.1)
Sodium: 131 mmol/L — ABNORMAL LOW (ref 135–145)
Total Bilirubin: 0.6 mg/dL (ref 0.0–1.2)
Total Protein: 6.5 g/dL (ref 6.5–8.1)

## 2023-11-12 LAB — RESP PANEL BY RT-PCR (RSV, FLU A&B, COVID)  RVPGX2
Influenza A by PCR: NEGATIVE
Influenza B by PCR: NEGATIVE
Resp Syncytial Virus by PCR: NEGATIVE
SARS Coronavirus 2 by RT PCR: NEGATIVE

## 2023-11-12 LAB — MAGNESIUM: Magnesium: 1.6 mg/dL — ABNORMAL LOW (ref 1.7–2.4)

## 2023-11-12 LAB — CBG MONITORING, ED: Glucose-Capillary: 113 mg/dL — ABNORMAL HIGH (ref 70–99)

## 2023-11-12 LAB — AMMONIA: Ammonia: 20 umol/L (ref 9–35)

## 2023-11-12 MED ORDER — ONDANSETRON HCL 4 MG PO TABS: 4.0000 mg | ORAL_TABLET | Freq: Four times a day (QID) | ORAL | Status: DC | PRN

## 2023-11-12 MED ORDER — ENOXAPARIN SODIUM 40 MG/0.4ML IJ SOSY
40.0000 mg | PREFILLED_SYRINGE | INTRAMUSCULAR | Status: DC
Start: 2023-11-12 — End: 2023-11-17
  Administered 2023-11-12 – 2023-11-16 (×5): 40 mg via SUBCUTANEOUS
  Filled 2023-11-12 (×5): qty 0.4

## 2023-11-12 MED ORDER — HYDROCORTISONE 5 MG PO TABS
5.0000 mg | ORAL_TABLET | Freq: Every day | ORAL | Status: DC
  Filled 2023-11-12: qty 1

## 2023-11-12 MED ORDER — IOHEXOL 350 MG/ML SOLN
60.0000 mL | Freq: Once | INTRAVENOUS | Status: AC | PRN
  Administered 2023-11-12: 60 mL via INTRAVENOUS

## 2023-11-12 MED ORDER — SODIUM CHLORIDE 0.9 % IV BOLUS
1000.0000 mL | Freq: Once | INTRAVENOUS | Status: AC
  Administered 2023-11-12: 1000 mL via INTRAVENOUS

## 2023-11-12 MED ORDER — ACETAMINOPHEN 500 MG PO TABS: 500.0000 mg | ORAL_TABLET | Freq: Four times a day (QID) | ORAL | Status: DC | PRN

## 2023-11-12 MED ORDER — MAGNESIUM SULFATE 2 GM/50ML IV SOLN
2.0000 g | Freq: Once | INTRAVENOUS | Status: AC
  Administered 2023-11-12: 2 g via INTRAVENOUS
  Filled 2023-11-12: qty 50

## 2023-11-12 MED ORDER — PRAVASTATIN SODIUM 40 MG PO TABS
40.0000 mg | ORAL_TABLET | Freq: Every day | ORAL | Status: DC
Start: 2023-11-13 — End: 2023-11-17
  Administered 2023-11-13 – 2023-11-17 (×5): 40 mg via ORAL
  Filled 2023-11-12 (×5): qty 1

## 2023-11-12 MED ORDER — LEVOTHYROXINE SODIUM 50 MCG PO TABS
62.5000 ug | ORAL_TABLET | Freq: Every day | ORAL | Status: DC
  Administered 2023-11-13 – 2023-11-17 (×4): 62.5 ug via ORAL
  Filled 2023-11-12 (×5): qty 1

## 2023-11-12 MED ORDER — PANTOPRAZOLE SODIUM 40 MG IV SOLR
40.0000 mg | INTRAVENOUS | Status: DC
  Administered 2023-11-12: 40 mg via INTRAVENOUS
  Filled 2023-11-12: qty 10

## 2023-11-12 MED ORDER — HYDROCORTISONE 5 MG PO TABS
5.0000 mg | ORAL_TABLET | Freq: Two times a day (BID) | ORAL | Status: DC
  Administered 2023-11-12: 5 mg via ORAL
  Filled 2023-11-12 (×3): qty 1

## 2023-11-12 MED ORDER — SODIUM CHLORIDE 0.9 % IV SOLN: INTRAVENOUS | Status: AC

## 2023-11-12 MED ORDER — PANTOPRAZOLE SODIUM 40 MG PO TBEC: 40.0000 mg | DELAYED_RELEASE_TABLET | Freq: Every day | ORAL | Status: DC

## 2023-11-12 MED ORDER — ONDANSETRON HCL 4 MG/2ML IJ SOLN
4.0000 mg | Freq: Four times a day (QID) | INTRAMUSCULAR | Status: DC | PRN
  Administered 2023-11-12 – 2023-11-15 (×4): 4 mg via INTRAVENOUS
  Filled 2023-11-12 (×4): qty 2

## 2023-11-12 NOTE — ED Provider Notes (Signed)
[facility] DEPARTMENT AT [facility] Provider Note   CSN: [id number] Arrival date & time: 11/12/23  0935     History  No chief complaint on file.   Charlene Thomas is a 78 y.o. female.  HPI Patient presents 1 week after being discharged from our facility now with concern for confusion, nausea, vomiting.  Patient arrives via EMS.  She is initially then alone, but subsequently joined by her daughter.  Daughter notes the patient was recovering benignly at home until yesterday when she had several episodes of vomiting.  Reported the patient did not have any pain during that period.  She subsequently become more confused, withdrawn, less verbal.  EMS reports the patient was hemodynamically unremarkable in transport.  Chart review notable for admission last week for hyponatremia, with sodium value as low as 124.  Patient was discharged with sodium supplements which she has reportedly been taking.    Home Medications Prior to Admission medications   Medication Sig Start Date End Date Taking? Authorizing Provider  acetaminophen (TYLENOL) 500 MG tablet Take 1-2 tablets (500-1,000 mg total) by mouth every 6 (six) hours as needed for mild pain. 03/10/23   Anson Oregon, PA-C  albuterol (VENTOLIN HFA) 108 (90 Base) MCG/ACT inhaler Inhale 2 puffs into the lungs every 6 (six) hours as needed for wheezing or shortness of breath. Patient not taking: Reported on 11/08/2023 03/10/23   Alford Highland, MD  baclofen (LIORESAL) 20 MG tablet Take 20 mg by mouth 2 (two) times daily. 09/24/23   [provider]  calcium carbonate (CALCIUM 600) 600 MG TABS tablet Take 1 tablet (600 mg total) by mouth 2 (two) times daily with a meal. 03/10/23   Alford Highland, MD  cetirizine (ZYRTEC) 10 MG tablet Take 10 mg by mouth daily.    [provider]  desvenlafaxine (PRISTIQ) 50 MG 24 hr tablet Take 50 mg by mouth daily.    [provider]  diltiazem (CARDIZEM CD) 240  MG 24 hr capsule Take 240 mg by mouth daily. 01/02/23   [provider]  levothyroxine (SYNTHROID) 125 MCG tablet Take 0.5 tablets by mouth daily. 12/28/22   [provider]  losartan (COZAAR) 100 MG tablet Take 100 mg by mouth daily. 12/28/22   [provider]  metoprolol tartrate (LOPRESSOR) 25 MG tablet Take 25 mg by mouth 2 (two) times daily. 12/28/22   [provider]  modafinil (PROVIGIL) 100 MG tablet Take 100 mg by mouth daily.    [provider]  NYSTATIN powder Apply 1 Application topically 2 (two) times daily.    [provider]  omeprazole (PRILOSEC) 20 MG capsule Take 20 mg by mouth 2 (two) times daily. 02/19/16   [provider]  pravastatin (PRAVACHOL) 40 MG tablet Take 1 tablet (40 mg total) by mouth daily. 01/28/23   Delfino Lovett, MD  sodium chloride 1 g tablet Take 1 tablet (1 g total) by mouth 2 (two) times daily with a meal for 3 days. 11/09/23 11/12/23  Lorin Glass, MD  Vitamin D, Ergocalciferol, (DRISDOL) 1.25 MG (50000 UNIT) CAPS capsule Take 50,000 Units by mouth once a week. On Sundays 02/11/23   [provider]      Allergies    Topiramate, Aspirin, Celecoxib, Ezetimibe-simvastatin, Mobic [meloxicam], and Raloxifene    Review of Systems   Review of Systems  Physical Exam Updated Vital Signs BP 122/79   Pulse 95   Temp 100.3 F (37.9 C) (Oral)  Resp 18   Ht 5\' 3"  (1.6 m)   Wt 58.9 kg   SpO2 93%   BMI 23.00 kg/m  Physical Exam Vitals and nursing note reviewed.  Constitutional:      General: She is not in acute distress.    Appearance: She is well-developed.  HENT:     Head: Normocephalic and atraumatic.  Eyes:     Conjunctiva/sclera: Conjunctivae normal.  Cardiovascular:     Rate and Rhythm: Normal rate and regular rhythm.  Pulmonary:     Effort: Pulmonary effort is normal. No respiratory distress.     Breath sounds: Normal breath sounds. No stridor.  Abdominal:     General: There is  no distension.     Tenderness: There is no abdominal tenderness. There is no guarding.  Skin:    General: Skin is warm and dry.  Neurological:     Mental Status: She is alert and oriented to person, place, and time.     Cranial Nerves: No cranial nerve deficit.     Motor: Atrophy present.  Psychiatric:        Attention and Perception: She is inattentive.        Mood and Affect: Affect is flat.        Behavior: Behavior is slowed and withdrawn.        Cognition and Memory: Memory is impaired.     ED Results / Procedures / Treatments   Labs (all labs ordered are listed, but only abnormal results are displayed) Labs Reviewed  COMPREHENSIVE METABOLIC PANEL - Abnormal; Notable for the following components:      Result Value   Sodium 131 (*)    Chloride 94 (*)    Glucose, Bld 108 (*)    BUN 7 (*)    All other components within normal limits  CBC WITH DIFFERENTIAL/PLATELET - Abnormal; Notable for the following components:   Hemoglobin 11.2 (*)    HCT 33.8 (*)    RDW 15.8 (*)    Lymphs Abs 0.4 (*)    All other components within normal limits  URINALYSIS, ROUTINE W REFLEX MICROSCOPIC - Abnormal; Notable for the following components:   APPearance HAZY (*)    Leukocytes,Ua SMALL (*)    Bacteria, UA RARE (*)    All other components within normal limits  MAGNESIUM - Abnormal; Notable for the following components:   Magnesium 1.6 (*)    All other components within normal limits  CBG MONITORING, ED - Abnormal; Notable for the following components:   Glucose-Capillary 113 (*)    All other components within normal limits    EKG EKG Interpretation Date/Time:  Tuesday November 12 2023 09:52:42 EST Ventricular Rate:  90 PR Interval:  171 QRS Duration:  78 QT Interval:  352 QTC Calculation: 431 R Axis:   17  Text Interpretation: Sinus rhythm Low voltage, precordial leads Artifact in lead(s) I II III aVR aVL aVF V1 V2 V4 V5 V6 Confirmed by Gerhard Munch [phone]) on 11/12/2023  12:36:50 PM  Radiology CT Head Wo Contrast Result Date: 11/12/2023 CLINICAL DATA:  Mental status change EXAM: CT HEAD WITHOUT CONTRAST TECHNIQUE: Contiguous axial images were obtained from the base of the skull through the vertex without intravenous contrast. RADIATION DOSE REDUCTION: This exam was performed according to the departmental dose-optimization program which includes automated exposure control, adjustment of the mA and/or kV according to patient size and/or use of iterative reconstruction technique. COMPARISON:  11/08/2023 FINDINGS: Brain: No evidence of acute infarction, hemorrhage, mass,  mass effect, or midline shift. No hydrocephalus or extra-axial fluid collection. Age related cerebral atrophy, Periventricular white matter changes, likely the sequela of chronic small vessel ischemic disease. 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 Vascular: No hyperdense vessel. Atherosclerotic calcifications in the intracranial carotid and vertebral arteries. Skull: Negative for fracture or focal lesion. Sinuses/Orbits: Mucosal thickening in the anterior ethmoid air cells and inferior frontal sinuses. Air-fluid level in the left sphenoid sinus. No acute finding in the orbits. Status post bilateral lens replacements. Other: Trace fluid in the mastoid air cells. IMPRESSION: No acute intracranial process. Electronically Signed   By: Wiliam Ke M.D.   On: 11/12/2023 14:30   DG Chest Port 1 View Result Date: 11/12/2023 CLINICAL DATA:  Altered mental status and generalized weakness. EXAM: PORTABLE CHEST 1 VIEW COMPARISON:  Chest radiograph dated 11/08/2023. FINDINGS: No focal consolidation, pleural effusion, or pneumothorax. The cardiac silhouette is within normal limits. The aorta is tortuous. No acute osseous pathology. IMPRESSION: No active disease. Electronically Signed   By: Elgie Collard M.D.   On: 11/12/2023 11:09    Procedures Procedures    Medications Ordered in ED Medications   sodium chloride 0.9 % bolus 1,000 mL (has no administration in time range)  hydrocortisone (CORTEF) tablet 5 mg (has no administration in time range)    ED Course/ Medical Decision Making/ A&P                                 Medical Decision Making Elderly female with recent admission for encephalopathy, with diagnosis of hyponatremia presents with similar symptoms after an episode of nausea, vomiting yesterday.  Concern for electrolyte abnormalities, versus endocrinologic etiology versus infection.  Patient had labs x-ray CT ordered. Chart review notable for summary included below. Cardiac 105 sinus tach abnormal pulse ox 94% borderline  Amount and/or Complexity of Data Reviewed Independent Historian: EMS External Data Reviewed: notes. Labs: ordered. Decision-making details documented in ED Course. Radiology: ordered and independent interpretation performed. Decision-making details documented in ED Course. ECG/medicine tests: ordered and independent interpretation performed. Decision-making details documented in ED Course.  Risk Prescription drug management. Decision regarding hospitalization. Diagnosis or treatment significantly limited by social determinants of health.  Brief narrative: RHEMI BALBACH is a 77 y.o. female with PMH significant for dementia, HTN, palpitations, hypothyroidism, GERD, arthritis. 1/24, patient was brought to the ED with complaint of 24-hour nausea, vomiting, diarrhea, altered mental status. The day prior, patient developed diarrhea without bleeding, followed by vomiting.  In the next several hours, she had 2-3 episodes of diarrhea and few episodes of vomiting, became weak and confused and hence brought to the ED. Family also reported facial droop which had resolved by the time of EMS presentation. Also reported a fall last week.   In the ED, afebrile, hemodynamically stable. Labs with WBC count 8, hemoglobin 11.7, sodium 124, Respiratory virus panel  unremarkable CT head without any acute intracranial abnormality, showed mild diffuse atrophy and moderate chronic small vessel ischemic disease. Admitted to [facility] for further workup. 3:09 PM Patient in similar condition on repeat exam.  Labs notable for mild hyponatremia not as severe as prior but still low.  On reviewing the patient's chart, it is clear that on the day of discharge she had subtherapeutic cortisol levels as well as some suspicion for endocrinologic etiology for her encephalopathy.  No overt infection.  Patient has received fluids, will receive hydrocortisone for suspicious of  adrenal insufficiency.  Patient admitted for further monitoring, management.   Final Clinical Impression(s) / ED Diagnoses Final diagnoses:  Acute encephalopathy     Gerhard Munch, MD 11/12/23 [phone]

## 2023-11-12 NOTE — Telephone Encounter (Signed)
Appointment cx, pt sent to ED

## 2023-11-12 NOTE — H&P (Signed)
History and Physical    MARNESHA GAGEN NFA:[id number] DOB: 12-17-1945 DOA: 11/12/2023  PCP: Marguarite Arbour, MD   Patient coming from: Home    Chief Complaint: Confusion, nausea, vomiting  HPI: Charlene Thomas is a 78 y.o. female with medical history significant of dementia, hypertension, palpitations, hypothyroidism, GERD, arthritis who presented here from home with complaint of confusion, nausea, vomiting.  Patient was admitted with same symptoms on 1/24 and was discharged on 1/25. She was doing okay after discharge but she started having several episode of vomiting, became confused since yesterday evening.  Her daughter told that after the shower yesterday evening, she threw up multiple times.vomiting was projectile in nature.  She had 2 episodes of bowel incontinence but they were not diarrhea.  She denied any abdominal pain.  Daughter found her to be more confused, weak on the left side and repeating same thing again and again.  At baseline, she is confused to time, ambulates with the help of walker, oriented to place.  Her daughter then brought her to the emergency department for further evaluation. No report of fever, chills, chest pain, shortness of breath, abdominal pain, dysuria, hematochezia, and or loss of consciousness. Patient seen and examined at bedside this afternoon.  Daughter at bedside.  She was hemodynamically stable.  Patient was lying on bed, confused to time but oriented to place.  As per daughter, she is less verbal than at baseline.  She does not appear to be in any kind of distress.   ED Course: Remained hemodynamically  stable.On presentation, she was hemodynamically stable.  CT head did not show any acute findings like last time.  Lab work showed sodium of 131, magnesium of 1.6.  Blood pressure was stable.  She was afebrile. Patient being admitted for further evaluation.  Review of Systems: As per HPI otherwise 10 point review of systems negative.    Past  Medical History:  Diagnosis Date   Anginal pain (HCC)    Arthritis    Dysrhythmia    TACHYCARDIA   GERD (gastroesophageal reflux disease)    Heart palpitations    Hypertension    Hypothyroidism     Past Surgical History:  Procedure Laterality Date   ABDOMINAL HYSTERECTOMY     CATARACT EXTRACTION W/PHACO Left 05/15/2016   Procedure: CATARACT EXTRACTION PHACO AND INTRAOCULAR LENS PLACEMENT (IOC);  Surgeon: Galen Manila, MD;  Location: [facility] ORS;  Service: Ophthalmology;  Laterality: Left;  [place] 00:34 AP% 20.1 CDE 6.98 Fluid pack lot # [id number]   CATARACT EXTRACTION W/PHACO Right 06/12/2016   Procedure: CATARACT EXTRACTION PHACO AND INTRAOCULAR LENS PLACEMENT (IOC);  Surgeon: Galen Manila, MD;  Location: [facility] ORS;  Service: Ophthalmology;  Laterality: Right;  Lot# [id number] [place]: 1:05.5 AP%:45.8 CDE:12.87   CESAREAN SECTION     X 3   CHOLECYSTECTOMY     COMPRESSION HIP SCREW Left 03/08/2023   Procedure: Cannulated Screw Fixation of the Left Femoral Neck Fracture;  Surgeon: Christena Flake, MD;  Location: [facility] ORS;  Service: Orthopedics;  Laterality: Left;   GRAFTS     SKIN GRAFTS FOR BURNS   KIDNEY STONE SURGERY     THROAT SURGERY       reports that she has quit smoking. She has never used smokeless tobacco. She reports that she does not drink alcohol and does not use drugs.  Allergies  Allergen Reactions   Topiramate Other (See Comments)    Causes severe confusion   Aspirin    Celecoxib Other (See  Comments)    Gi upset   Ezetimibe-Simvastatin Other (See Comments)    Other Reaction(s): Muscle Pain   Mobic [Meloxicam] Itching   Raloxifene Other (See Comments)    Other Reaction(s): Unknown  Upset gi    Family History  Problem Relation Age of Onset   Breast cancer Neg Hx      Prior to Admission medications   Medication Sig Start Date End Date Taking? Authorizing Provider  acetaminophen (TYLENOL) 500 MG tablet Take 1-2 tablets (500-1,000 mg total) by mouth every 6  (six) hours as needed for mild pain. 03/10/23   Anson Oregon, PA-C  albuterol (VENTOLIN HFA) 108 (90 Base) MCG/ACT inhaler Inhale 2 puffs into the lungs every 6 (six) hours as needed for wheezing or shortness of breath. Patient not taking: Reported on 11/08/2023 03/10/23   Alford Highland, MD  baclofen (LIORESAL) 20 MG tablet Take 20 mg by mouth 2 (two) times daily. 09/24/23   [provider]  calcium carbonate (CALCIUM 600) 600 MG TABS tablet Take 1 tablet (600 mg total) by mouth 2 (two) times daily with a meal. 03/10/23   Alford Highland, MD  cetirizine (ZYRTEC) 10 MG tablet Take 10 mg by mouth daily.    [provider]  desvenlafaxine (PRISTIQ) 50 MG 24 hr tablet Take 50 mg by mouth daily.    [provider]  diltiazem (CARDIZEM CD) 240 MG 24 hr capsule Take 240 mg by mouth daily. 01/02/23   [provider]  levothyroxine (SYNTHROID) 125 MCG tablet Take 0.5 tablets by mouth daily. 12/28/22   [provider]  losartan (COZAAR) 100 MG tablet Take 100 mg by mouth daily. 12/28/22   [provider]  metoprolol tartrate (LOPRESSOR) 25 MG tablet Take 25 mg by mouth 2 (two) times daily. 12/28/22   [provider]  modafinil (PROVIGIL) 100 MG tablet Take 100 mg by mouth daily.    [provider]  NYSTATIN powder Apply 1 Application topically 2 (two) times daily.    [provider]  omeprazole (PRILOSEC) 20 MG capsule Take 20 mg by mouth 2 (two) times daily. 02/19/16   [provider]  pravastatin (PRAVACHOL) 40 MG tablet Take 1 tablet (40 mg total) by mouth daily. 01/28/23   Delfino Lovett, MD  sodium chloride 1 g tablet Take 1 tablet (1 g total) by mouth 2 (two) times daily with a meal for 3 days. 11/09/23 11/12/23  Lorin Glass, MD  Vitamin D, Ergocalciferol, (DRISDOL) 1.25 MG (50000 UNIT) CAPS capsule Take 50,000 Units by mouth once a week. On Sundays 02/11/23   [provider]    Physical Exam: Vitals:    11/12/23 1200 11/12/23 1230 11/12/23 1300 11/12/23 1330  BP: 109/73 112/63 110/71 122/79  Pulse: 95 (!) 103 95 95  Resp: 19 (!) 23 17 18   Temp:      TempSrc:      SpO2: 93% 93% 93% 93%  Weight:      Height:        Constitutional:Calm, comfortable, chronically deconditioned, lying on bed, not in apparent distress Vitals:   11/12/23 1200 11/12/23 1230 11/12/23 1300 11/12/23 1330  BP: 109/73 112/63 110/71 122/79  Pulse: 95 (!) 103 95 95  Resp: 19 (!) 23 17 18   Temp:      TempSrc:      SpO2: 93% 93% 93% 93%  Weight:      Height:       Eyes: PERRL, lids and conjunctivae normal ENMT: Mucous  membranes are moist.  Neck: normal, supple, no masses, no thyromegaly Respiratory: clear to auscultation bilaterally, no wheezing, no crackles. Normal respiratory effort. No accessory muscle use.  Cardiovascular: Regular rate and rhythm, no murmurs / rubs / gallops. No extremity edema.  Abdomen: no tenderness, no masses palpated. No hepatosplenomegaly. Bowel sounds positive.  Musculoskeletal: no clubbing / cyanosis. No joint deformity upper and lower extremities.  Skin: no rashes, lesions, ulcers. No induration Neurologic: CN 2-12 grossly intact.  Generalized, oriented to place only.    Foley Catheter:None  Labs on Admission: I have personally reviewed following labs and imaging studies  CBC: Recent Labs  Lab 11/08/23 1900 11/08/23 1910 11/08/23 2159 11/09/23 0402 11/12/23 1002  WBC 8.0  --  7.5 6.4 8.2  NEUTROABS 6.5  --   --   --  7.5  HGB 11.7* 12.9 11.2* 10.7* 11.2*  HCT 35.5* 38.0 34.6* 33.1* 33.8*  MCV 81.1  --  81.6 81.5 82.0  PLT 321  --  261 284 243   Basic Metabolic Panel: Recent Labs  Lab 11/08/23 1900 11/08/23 1910 11/08/23 2159 11/09/23 0402 11/12/23 1002  NA 124* 126* 126* 130* 131*  K 4.3 4.3 4.2 4.1 3.6  CL 92* 92* 97* 98 94*  CO2 22  --  20* 24 23  GLUCOSE 111* 111* 102* 97 108*  BUN 7* 7* 6* <5* 7*  CREATININE 0.85 0.80 0.75 0.81 0.79  CALCIUM 9.3  --   9.0 8.9 8.9  MG  --   --   --   --  1.6*   GFR: Estimated Creatinine Clearance: 46.4 mL/min (by C-G formula based on SCr of 0.79 mg/dL). Liver Function Tests: Recent Labs  Lab 11/08/23 1900 11/12/23 1002  AST 20 25  ALT 15 14  ALKPHOS 95 90  BILITOT 0.4 0.6  PROT 7.1 6.5  ALBUMIN 3.9 3.5   Recent Labs  Lab 11/08/23 2159  LIPASE 29   No results for input(s): "AMMONIA" in the last 168 hours. Coagulation Profile: Recent Labs  Lab 11/08/23 1900 11/09/23 0402  INR 1.1 1.1   Cardiac Enzymes: No results for input(s): "CKTOTAL", "CKMB", "CKMBINDEX", "TROPONINI" in the last 168 hours. BNP (last 3 results) No results for input(s): "PROBNP" in the last 8760 hours. HbA1C: No results for input(s): "HGBA1C" in the last 72 hours. CBG: Recent Labs  Lab 11/08/23 1703 11/12/23 1000  GLUCAP 132* 113*   Lipid Profile: No results for input(s): "CHOL", "HDL", "LDLCALC", "TRIG", "CHOLHDL", "LDLDIRECT" in the last 72 hours. Thyroid Function Tests: No results for input(s): "TSH", "T4TOTAL", "FREET4", "T3FREE", "THYROIDAB" in the last 72 hours. Anemia Panel: No results for input(s): "VITAMINB12", "FOLATE", "FERRITIN", "TIBC", "IRON", "RETICCTPCT" in the last 72 hours. Urine analysis:    Component Value Date/Time   COLORURINE YELLOW 11/12/2023 1118   APPEARANCEUR HAZY (A) 11/12/2023 1118   LABSPEC 1.018 11/12/2023 1118   PHURINE 5.0 11/12/2023 1118   GLUCOSEU NEGATIVE 11/12/2023 1118   HGBUR NEGATIVE 11/12/2023 1118   BILIRUBINUR NEGATIVE 11/12/2023 1118   KETONESUR NEGATIVE 11/12/2023 1118   PROTEINUR NEGATIVE 11/12/2023 1118   NITRITE NEGATIVE 11/12/2023 1118   LEUKOCYTESUR SMALL (A) 11/12/2023 1118    Radiological Exams on Admission: CT Head Wo Contrast Result Date: 11/12/2023 CLINICAL DATA:  Mental status change EXAM: CT HEAD WITHOUT CONTRAST TECHNIQUE: Contiguous axial images were obtained from the base of the skull through the vertex without intravenous contrast.  RADIATION DOSE REDUCTION: This exam was performed according to the departmental dose-optimization program which  includes automated exposure control, adjustment of the mA and/or kV according to patient size and/or use of iterative reconstruction technique. COMPARISON:  11/08/2023 FINDINGS: Brain: No evidence of acute infarction, hemorrhage, mass, mass effect, or midline shift. No hydrocephalus or extra-axial fluid collection. Age related cerebral atrophy, Periventricular white matter changes, likely the sequela of chronic small vessel ischemic disease. 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 Vascular: No hyperdense vessel. Atherosclerotic calcifications in the intracranial carotid and vertebral arteries. Skull: Negative for fracture or focal lesion. Sinuses/Orbits: Mucosal thickening in the anterior ethmoid air cells and inferior frontal sinuses. Air-fluid level in the left sphenoid sinus. No acute finding in the orbits. Status post bilateral lens replacements. Other: Trace fluid in the mastoid air cells. IMPRESSION: No acute intracranial process. Electronically Signed   By: Wiliam Ke M.D.   On: 11/12/2023 14:30   DG Chest Port 1 View Result Date: 11/12/2023 CLINICAL DATA:  Altered mental status and generalized weakness. EXAM: PORTABLE CHEST 1 VIEW COMPARISON:  Chest radiograph dated 11/08/2023. FINDINGS: No focal consolidation, pleural effusion, or pneumothorax. The cardiac silhouette is within normal limits. The aorta is tortuous. No acute osseous pathology. IMPRESSION: No active disease. Electronically Signed   By: Elgie Collard M.D.   On: 11/12/2023 11:09     Assessment/Plan Principal Problem:   AMS (altered mental status) Active Problems:   Hyponatremia   Dementia with behavioral disturbance (HCC)   Hypertension   Hypothyroidism   GERD (gastroesophageal reflux disease)   Dyslipidemia   Low serum cortisol level   Nausea & vomiting   Altered mental status on the background of  dementia: Unclear etiology.  Patient was admitted for the same last time.  Patient has dementia and is confused at baseline but she was thought to be more confused, less verbal, also had intermittent weakness on the left side..  CT head showed no acute findings.  Will check ammonia level, will consider MRI of the brain.  TSH checked on 1/24 was within normal limit. Continue frequent orientation, delirium precautions. Patient takes desvenlafaxine, modafinil, baclofen as needed.  Will hold this medicines for now. Patient lives with her daughter, ambulatory with cane at home.  Nausea/vomiting/diarrhea: Was admitted with this problem on last admission.  No fever or leukocytosis.  Currently not on antibiotics.  No report of abdominal pain or severe diarrhea.  Continue antiemetics, gentle IV fluids.  Abdomen is benign on examination. Will check CT abdomen/pelvis.  Will also consult GI for recurrent admission due to nausea and vomiting .  Vomiting was projectile in nature.  Low serum cortisol level: During last admission, a.m. cortisol level was checked and it was 4.5.  Will start on Cortef.  Electrolytes stable she needs to follow-up with endocrinology as an outpatient  Hyponatremia: Mild, better than last readings.  Continue to monitor  History of hypertension: Takes metoprolol, Cardizem, losartan at home.  Currently blood pressure stable.  Continue to hold this medicine for now.  Hypothyroidism: Continue Synthyroid  Hyperlipidemia: On pravastatin  GERD: Continue PPI  Debility/deconditioning: Patient lives with daughter.  Will consult PT/OT      Severity of Illness: The appropriate patient status for this patient is OBSERVATION.   DVT prophylaxis: Lovenox Code Status: Full code Family Communication: Daughter at bedside Consults called: GI     Burnadette Pop MD [facility]  11/12/2023, 3:17 PM

## 2023-11-12 NOTE — ED Triage Notes (Signed)
Pt from home via EMS. Previously seen for low sodium levels and AMS recently. Taking prescribed med for sodium levels at home. Last night onset of projectile vomiting. 0700 family noticed generalized weakness and speech difficulty upon waking up. Similar to the last time she was brought in. Also reports that the left arm was contracted into the body which is not normal for the pt, able to stretch arm out at this time. Pt normally leans to right at baseline. Cognitive deficits at baseline, disoriented to time. BG WNL.

## 2023-11-13 DIAGNOSIS — Z79899 Other long term (current) drug therapy: Secondary | ICD-10-CM | POA: Diagnosis not present

## 2023-11-13 DIAGNOSIS — Z87891 Personal history of nicotine dependence: Secondary | ICD-10-CM | POA: Diagnosis not present

## 2023-11-13 DIAGNOSIS — K219 Gastro-esophageal reflux disease without esophagitis: Secondary | ICD-10-CM | POA: Diagnosis present

## 2023-11-13 DIAGNOSIS — R159 Full incontinence of feces: Secondary | ICD-10-CM | POA: Diagnosis present

## 2023-11-13 DIAGNOSIS — Z1152 Encounter for screening for COVID-19: Secondary | ICD-10-CM | POA: Diagnosis not present

## 2023-11-13 DIAGNOSIS — Z9049 Acquired absence of other specified parts of digestive tract: Secondary | ICD-10-CM | POA: Diagnosis not present

## 2023-11-13 DIAGNOSIS — R112 Nausea with vomiting, unspecified: Secondary | ICD-10-CM

## 2023-11-13 DIAGNOSIS — I4891 Unspecified atrial fibrillation: Secondary | ICD-10-CM | POA: Diagnosis not present

## 2023-11-13 DIAGNOSIS — I10 Essential (primary) hypertension: Secondary | ICD-10-CM | POA: Diagnosis present

## 2023-11-13 DIAGNOSIS — R5381 Other malaise: Secondary | ICD-10-CM | POA: Diagnosis present

## 2023-11-13 DIAGNOSIS — Z886 Allergy status to analgesic agent status: Secondary | ICD-10-CM | POA: Diagnosis not present

## 2023-11-13 DIAGNOSIS — Z90710 Acquired absence of both cervix and uterus: Secondary | ICD-10-CM | POA: Diagnosis not present

## 2023-11-13 DIAGNOSIS — F03918 Unspecified dementia, unspecified severity, with other behavioral disturbance: Secondary | ICD-10-CM | POA: Diagnosis present

## 2023-11-13 DIAGNOSIS — R4182 Altered mental status, unspecified: Secondary | ICD-10-CM | POA: Diagnosis not present

## 2023-11-13 DIAGNOSIS — E871 Hypo-osmolality and hyponatremia: Secondary | ICD-10-CM

## 2023-11-13 DIAGNOSIS — Z79890 Hormone replacement therapy (postmenopausal): Secondary | ICD-10-CM | POA: Diagnosis not present

## 2023-11-13 DIAGNOSIS — G928 Other toxic encephalopathy: Secondary | ICD-10-CM | POA: Diagnosis present

## 2023-11-13 DIAGNOSIS — E785 Hyperlipidemia, unspecified: Secondary | ICD-10-CM | POA: Diagnosis present

## 2023-11-13 DIAGNOSIS — Z9842 Cataract extraction status, left eye: Secondary | ICD-10-CM | POA: Diagnosis not present

## 2023-11-13 DIAGNOSIS — A0811 Acute gastroenteropathy due to Norwalk agent: Secondary | ICD-10-CM | POA: Diagnosis present

## 2023-11-13 DIAGNOSIS — Z87442 Personal history of urinary calculi: Secondary | ICD-10-CM | POA: Diagnosis not present

## 2023-11-13 DIAGNOSIS — Z961 Presence of intraocular lens: Secondary | ICD-10-CM | POA: Diagnosis present

## 2023-11-13 DIAGNOSIS — E876 Hypokalemia: Secondary | ICD-10-CM

## 2023-11-13 DIAGNOSIS — E039 Hypothyroidism, unspecified: Secondary | ICD-10-CM | POA: Diagnosis present

## 2023-11-13 DIAGNOSIS — Y92009 Unspecified place in unspecified non-institutional (private) residence as the place of occurrence of the external cause: Secondary | ICD-10-CM | POA: Diagnosis not present

## 2023-11-13 DIAGNOSIS — G9340 Encephalopathy, unspecified: Secondary | ICD-10-CM | POA: Diagnosis present

## 2023-11-13 DIAGNOSIS — Z888 Allergy status to other drugs, medicaments and biological substances status: Secondary | ICD-10-CM | POA: Diagnosis not present

## 2023-11-13 DIAGNOSIS — Z9841 Cataract extraction status, right eye: Secondary | ICD-10-CM | POA: Diagnosis not present

## 2023-11-13 LAB — COMPREHENSIVE METABOLIC PANEL
ALT: 14 U/L (ref 0–44)
AST: 19 U/L (ref 15–41)
Albumin: 3.1 g/dL — ABNORMAL LOW (ref 3.5–5.0)
Alkaline Phosphatase: 77 U/L (ref 38–126)
Anion gap: 11 (ref 5–15)
BUN: 7 mg/dL — ABNORMAL LOW (ref 8–23)
CO2: 23 mmol/L (ref 22–32)
Calcium: 8.3 mg/dL — ABNORMAL LOW (ref 8.9–10.3)
Chloride: 98 mmol/L (ref 98–111)
Creatinine, Ser: 0.73 mg/dL (ref 0.44–1.00)
GFR, Estimated: >60 mL/min (ref >60)
Glucose, Bld: 99 mg/dL (ref 70–99)
Potassium: 3.1 mmol/L — ABNORMAL LOW (ref 3.5–5.1)
Sodium: 132 mmol/L — ABNORMAL LOW (ref 135–145)
Total Bilirubin: 0.4 mg/dL (ref 0.0–1.2)
Total Protein: 5.8 g/dL — ABNORMAL LOW (ref 6.5–8.1)

## 2023-11-13 LAB — MAGNESIUM: Magnesium: 2.2 mg/dL (ref 1.7–2.4)

## 2023-11-13 MED ORDER — COSYNTROPIN 0.25 MG IJ SOLR
0.2500 mg | Freq: Once | INTRAMUSCULAR | Status: AC
  Administered 2023-11-14: 0.25 mg via INTRAVENOUS
  Filled 2023-11-13: qty 0.25

## 2023-11-13 MED ORDER — POTASSIUM CHLORIDE 10 MEQ/100ML IV SOLN
10.0000 meq | INTRAVENOUS | Status: AC
  Administered 2023-11-13 (×5): 10 meq via INTRAVENOUS
  Filled 2023-11-13 (×5): qty 100

## 2023-11-13 MED ORDER — PANTOPRAZOLE SODIUM 40 MG IV SOLR
40.0000 mg | Freq: Two times a day (BID) | INTRAVENOUS | Status: DC
  Administered 2023-11-13: 40 mg via INTRAVENOUS
  Filled 2023-11-13: qty 10

## 2023-11-13 NOTE — Consult Note (Addendum)
Referring Provider: [facility], Dr. Renford Dills Primary Care Physician:  Marguarite Arbour, MD Primary Gastroenterologist:  ? Dr. Mechele Collin at [facility] remotely  Reason for Consultation:  Nausea and vomiting  HPI: Charlene Thomas is a 78 y.o. female with medical history significant of dementia, hypertension, palpitations, hypothyroidism, GERD, and arthritis who presented here from home with complaints of confusion, nausea and vomiting.  Patient was admitted with same symptoms on 1/24 and was discharged on 1/25.  She was doing okay after discharge but she started having several episode of vomiting, became confused.  Her daughter-in-law said that after the shower yesterday evening, she threw up multiple times, vomiting was projectile in nature.   TSH normal.  Ammonia level normal.  AM cortisol was low at 4.5 during last hospitalization.  I have ordered some hydrocortisone twice daily and an ACTH stim test for tomorrow. Hyponatremia with Na+ of 132 and hypokalemia with K+ of 3.1  CT scan of the abdomen and pelvis with contrast showed no acute intra-abdominal abnormalities.  MRI head without any acute issues.  She denies abdominal pain.  The daughter-in-law reports that she has had no diarrhea, but they ordered and collected a stool GI pathogen panel here.  Patient does take omeprazole 20 mg twice daily at home for several years.  Daughter-in-law tells me that patient has not had any recent GI history, anything would have been very remote.   Past Medical History:  Diagnosis Date   Anginal pain (HCC)    Arthritis    Dysrhythmia    TACHYCARDIA   GERD (gastroesophageal reflux disease)    Heart palpitations    Hypertension    Hypothyroidism     Past Surgical History:  Procedure Laterality Date   ABDOMINAL HYSTERECTOMY     CATARACT EXTRACTION W/PHACO Left 05/15/2016   Procedure: CATARACT EXTRACTION PHACO AND INTRAOCULAR LENS PLACEMENT (IOC);  Surgeon: Galen Manila, MD;  Location: [facility] ORS;  Service:  Ophthalmology;  Laterality: Left;  [place] 00:34 AP% 20.1 CDE 6.98 Fluid pack lot # [id number]   CATARACT EXTRACTION W/PHACO Right 06/12/2016   Procedure: CATARACT EXTRACTION PHACO AND INTRAOCULAR LENS PLACEMENT (IOC);  Surgeon: Galen Manila, MD;  Location: [facility] ORS;  Service: Ophthalmology;  Laterality: Right;  Lot# [id number] [place]: 1:05.5 AP%:45.8 CDE:12.87   CESAREAN SECTION     X 3   CHOLECYSTECTOMY     COMPRESSION HIP SCREW Left 03/08/2023   Procedure: Cannulated Screw Fixation of the Left Femoral Neck Fracture;  Surgeon: Christena Flake, MD;  Location: [facility] ORS;  Service: Orthopedics;  Laterality: Left;   GRAFTS     SKIN GRAFTS FOR BURNS   KIDNEY STONE SURGERY     THROAT SURGERY      Prior to Admission medications   Medication Sig Start Date End Date Taking? Authorizing Provider  calcium carbonate (CALCIUM 600) 600 MG TABS tablet Take 1 tablet (600 mg total) by mouth 2 (two) times daily with a meal. Patient taking differently: Take 600 mg by mouth in the morning. 03/10/23  Yes Wieting, Richard, MD  cetirizine (ZYRTEC) 10 MG tablet Take 10 mg by mouth in the morning.   Yes [provider]  cyanocobalamin (VITAMIN B12) 1000 MCG tablet Take 1,000 mcg by mouth at bedtime.   Yes [provider]  desvenlafaxine (PRISTIQ) 50 MG 24 hr tablet Take 50 mg by mouth in the morning.   Yes [provider]  diltiazem (CARDIZEM CD) 240 MG 24 hr capsule Take 240 mg by mouth in the morning.  01/02/23  Yes [provider]  levothyroxine (SYNTHROID) 125 MCG tablet Take 0.5 tablets by mouth daily. 12/28/22  Yes [provider]  losartan (COZAAR) 100 MG tablet Take 100 mg by mouth at bedtime. 12/28/22  Yes [provider]  metoprolol tartrate (LOPRESSOR) 25 MG tablet Take 25 mg by mouth 2 (two) times daily. 12/28/22  Yes [provider]  modafinil (PROVIGIL) 100 MG tablet Take 100 mg by mouth in the morning.   Yes [provider]  Multiple  Vitamins-Minerals (WOMENS MULTIVITAMIN GUMMIES PO) Take 2 each by mouth in the morning. Vitafusion   Yes [provider]  omeprazole (PRILOSEC) 20 MG capsule Take 20 mg by mouth 2 (two) times daily. 02/19/16  Yes [provider]  Potassium 99 MG TABS Take 1 tablet by mouth in the morning.   Yes [provider]  pravastatin (PRAVACHOL) 40 MG tablet Take 1 tablet (40 mg total) by mouth daily. Patient taking differently: Take 40 mg by mouth at bedtime. 01/28/23  Yes Delfino Lovett, MD  Vitamin D, Ergocalciferol, (DRISDOL) 1.25 MG (50000 UNIT) CAPS capsule Take 50,000 Units by mouth once a week. On Sundays 02/11/23  Yes [provider]  acetaminophen (TYLENOL) 500 MG tablet Take 1-2 tablets (500-1,000 mg total) by mouth every 6 (six) hours as needed for mild pain. Patient not taking: Reported on 11/12/2023 03/10/23   Anson Oregon, PA-C  albuterol (VENTOLIN HFA) 108 (90 Base) MCG/ACT inhaler Inhale 2 puffs into the lungs every 6 (six) hours as needed for wheezing or shortness of breath. Patient not taking: Reported on 11/12/2023 03/10/23   Alford Highland, MD  alendronate (FOSAMAX) 70 MG tablet Take 70 mg by mouth once a week. Take with a full glass of water on an empty stomach. On Mondays Patient not taking: Reported on 11/12/2023    [provider]  baclofen (LIORESAL) 20 MG tablet Take 20 mg by mouth 2 (two) times daily as needed for muscle spasms. Patient not taking: Reported on 11/12/2023 09/24/23   [provider]  ondansetron (ZOFRAN) 4 MG tablet Take 4 mg by mouth every 8 (eight) hours as needed for nausea or vomiting. Patient not taking: Reported on 11/12/2023    [provider]    Current Facility-Administered Medications  Medication Dose Route Frequency Provider Last Rate Last Admin   0.9 %  sodium chloride infusion   Intravenous Continuous Burnadette Pop, MD 75 mL/hr at 11/13/23 1002 New Bag at 11/13/23 1002   acetaminophen  (TYLENOL) tablet 500-1,000 mg  500-1,000 mg Oral Q6H PRN Burnadette Pop, MD       [START ON 11/14/2023] cosyntropin (CORTROSYN) injection 0.25 mg  0.25 mg Intravenous Once Adhikari, Amrit, MD       enoxaparin (LOVENOX) injection 40 mg  40 mg Subcutaneous Q24H Adhikari, Amrit, MD   40 mg at 11/12/23 1838   hydrocortisone (CORTEF) tablet 5 mg  5 mg Oral BID Burnadette Pop, MD   5 mg at 11/12/23 2349   levothyroxine (SYNTHROID) tablet 62.5 mcg  62.5 mcg Oral Q0600 Burnadette Pop, MD   62.5 mcg at 11/13/23 0858   ondansetron (ZOFRAN) tablet 4 mg  4 mg Oral Q6H PRN Burnadette Pop, MD       Or   ondansetron (ZOFRAN) injection 4 mg  4 mg Intravenous Q6H PRN Burnadette Pop, MD   4 mg at 11/13/23 0644   pantoprazole (PROTONIX) injection 40 mg  40 mg Intravenous Q24H Burnadette Pop, MD   40 mg  at 11/12/23 1838   potassium chloride 10 mEq in 100 mL IVPB  10 mEq Intravenous Q1 Hr x 5 Adhikari, Amrit, MD 100 mL/hr at 11/13/23 1001 10 mEq at 11/13/23 1001   pravastatin (PRAVACHOL) tablet 40 mg  40 mg Oral Daily Burnadette Pop, MD       Current Outpatient Medications  Medication Sig Dispense Refill   calcium carbonate (CALCIUM 600) 600 MG TABS tablet Take 1 tablet (600 mg total) by mouth 2 (two) times daily with a meal. (Patient taking differently: Take 600 mg by mouth in the morning.) 60 tablet 0   cetirizine (ZYRTEC) 10 MG tablet Take 10 mg by mouth in the morning.     cyanocobalamin (VITAMIN B12) 1000 MCG tablet Take 1,000 mcg by mouth at bedtime.     desvenlafaxine (PRISTIQ) 50 MG 24 hr tablet Take 50 mg by mouth in the morning.     diltiazem (CARDIZEM CD) 240 MG 24 hr capsule Take 240 mg by mouth in the morning.     levothyroxine (SYNTHROID) 125 MCG tablet Take 0.5 tablets by mouth daily.     losartan (COZAAR) 100 MG tablet Take 100 mg by mouth at bedtime.     metoprolol tartrate (LOPRESSOR) 25 MG tablet Take 25 mg by mouth 2 (two) times daily.     modafinil (PROVIGIL) 100 MG tablet Take 100 mg by  mouth in the morning.     Multiple Vitamins-Minerals (WOMENS MULTIVITAMIN GUMMIES PO) Take 2 each by mouth in the morning. Vitafusion     omeprazole (PRILOSEC) 20 MG capsule Take 20 mg by mouth 2 (two) times daily.  4   Potassium 99 MG TABS Take 1 tablet by mouth in the morning.     pravastatin (PRAVACHOL) 40 MG tablet Take 1 tablet (40 mg total) by mouth daily. (Patient taking differently: Take 40 mg by mouth at bedtime.) 30 tablet 3   Vitamin D, Ergocalciferol, (DRISDOL) 1.25 MG (50000 UNIT) CAPS capsule Take 50,000 Units by mouth once a week. On Sundays     acetaminophen (TYLENOL) 500 MG tablet Take 1-2 tablets (500-1,000 mg total) by mouth every 6 (six) hours as needed for mild pain. (Patient not taking: Reported on 11/12/2023) 60 tablet 0   albuterol (VENTOLIN HFA) 108 (90 Base) MCG/ACT inhaler Inhale 2 puffs into the lungs every 6 (six) hours as needed for wheezing or shortness of breath. (Patient not taking: Reported on 11/12/2023) 18 g 0   alendronate (FOSAMAX) 70 MG tablet Take 70 mg by mouth once a week. Take with a full glass of water on an empty stomach. On Mondays (Patient not taking: Reported on 11/12/2023)     baclofen (LIORESAL) 20 MG tablet Take 20 mg by mouth 2 (two) times daily as needed for muscle spasms. (Patient not taking: Reported on 11/12/2023)     ondansetron (ZOFRAN) 4 MG tablet Take 4 mg by mouth every 8 (eight) hours as needed for nausea or vomiting. (Patient not taking: Reported on 11/12/2023)      Allergies as of 11/12/2023 - Review Complete 11/12/2023  Allergen Reaction Noted   Topiramate Other (See Comments) 01/27/2023   Aspirin  06/06/2016   Celecoxib Other (See Comments) 01/19/2014   Ezetimibe-simvastatin Other (See Comments) 01/19/2014   Mobic [meloxicam] Itching 04/20/2015   Raloxifene Other (See Comments) 01/19/2014    Family History  Problem Relation Age of Onset   Breast cancer Neg Hx     Social History   Socioeconomic History   Marital status:  Widowed    Spouse name: Not on file   Number of children: Not on file   Years of education: Not on file   Highest education level: Not on file  Occupational History   Not on file  Tobacco Use   Smoking status: Former   Smokeless tobacco: Never  Substance and Sexual Activity   Alcohol use: No   Drug use: No   Sexual activity: Not on file  Other Topics Concern   Not on file  Social History Narrative   Not on file   Social Drivers of Health   Financial Resource Strain: Not on file  Food Insecurity: Patient Unable To Answer (11/12/2023)   Hunger Vital Sign    Worried About Running Out of Food in the Last Year: Patient unable to answer    Ran Out of Food in the Last Year: Patient unable to answer  Transportation Needs: Patient Unable To Answer (11/12/2023)   PRAPARE - Transportation    Lack of Transportation (Medical): Patient unable to answer    Lack of Transportation (Non-Medical): Patient unable to answer  Physical Activity: Not on file  Stress: Not on file  Social Connections: Patient Unable To Answer (11/12/2023)   Social Connection and Isolation Panel [NHANES]    Frequency of Communication with Friends and Family: Patient unable to answer    Frequency of Social Gatherings with Friends and Family: Patient unable to answer    Attends Religious Services: Patient unable to answer    Active Member of Clubs or Organizations: Patient unable to answer    Attends Banker Meetings: Patient unable to answer    Marital Status: Patient unable to answer  Intimate Partner Violence: Patient Unable To Answer (11/12/2023)   Humiliation, Afraid, Rape, and Kick questionnaire    Fear of Current or Ex-Partner: Patient unable to answer    Emotionally Abused: Patient unable to answer    Physically Abused: Patient unable to answer    Sexually Abused: Patient unable to answer    Review of Systems: ROS is O/W negative except as mentioned in HPI.  Physical Exam: Vital signs in last  24 hours: Temp:  [98 F (36.7 C)-99.4 F (37.4 C)] 98.1 F (36.7 C) (01/29 0530) Pulse Rate:  [84-103] 84 (01/29 0800) Resp:  [14-23] 15 (01/29 0800) BP: (100-163)/(63-100) 122/76 (01/29 0800) SpO2:  [90 %-100 %] 98 % (01/29 0800)   General: Somnolent,, arouses somewhat, nontoxic-appearing Head:  Normocephalic and atraumatic. Eyes:  Sclera clear, no icterus.   Conjunctiva pink. Ears:  Normal auditory acuity. Mouth:  No deformity or lesions.   Lungs:  Clear throughout to auscultation.  No wheezes, crackles, or rhonchi.  Heart:  Regular rate and rhythm; no murmurs, clicks, rubs, or gallops. Abdomen:  Soft, non-distended.  BS present.  Non-tender. Msk:  Symmetrical without gross deformities. Pulses:  Normal pulses noted. Extremities:  Without clubbing or edema. Neurologic:  Sleepy but dose arouse and answers basic questions. Skin:  Intact without significant lesions or rashes.  Lab Results: Recent Labs    11/12/23 1002  WBC 8.2  HGB 11.2*  HCT 33.8*  PLT 243   BMET Recent Labs    11/12/23 1002 11/13/23 0629  NA 131* 132*  K 3.6 3.1*  CL 94* 98  CO2 23 23  GLUCOSE 108* 99  BUN 7* 7*  CREATININE 0.79 0.73  CALCIUM 8.9 8.3*   LFT Recent Labs    11/13/23 0629  PROT 5.8*  ALBUMIN 3.1*  AST 19  ALT 14  ALKPHOS 77  BILITOT 0.4   Studies/Results: CT ABDOMEN PELVIS W CONTRAST Result Date: 11/12/2023 CLINICAL DATA:  Bowel obstruction suspected Previously seen for low sodium levels and AMS recently. Taking prescribed med for sodium levels at home EXAM: CT ABDOMEN AND PELVIS WITH CONTRAST TECHNIQUE: Multidetector CT imaging of the abdomen and pelvis was performed using the standard protocol following bolus administration of intravenous contrast. RADIATION DOSE REDUCTION: This exam was performed according to the departmental dose-optimization program which includes automated exposure control, adjustment of the mA and/or kV according to patient size and/or use of iterative  reconstruction technique. CONTRAST:  60mL OMNIPAQUE IOHEXOL 350 MG/ML SOLN COMPARISON:  CT abdomen pelvis 01/26/2023, CT renal 04/18/2021 FINDINGS: Lower chest: No acute abnormality. Hepatobiliary: Subcentimeter hypodensities too small to characterize. Status post cholecystectomy. No biliary dilatation. Pancreas: No focal lesion. Normal pancreatic contour. No surrounding inflammatory changes. No main pancreatic ductal dilatation. Spleen: Normal in size without focal abnormality. Adrenals/Urinary Tract: No adrenal nodule bilaterally. Bilateral kidneys enhance symmetrically. No hydronephrosis. No hydroureter. The urinary bladder is unremarkable. On delayed imaging, there is no urothelial wall thickening and there are no filling defects in the opacified portions of the bilateral collecting systems or ureters. Stomach/Bowel: Stomach is within normal limits. No evidence of bowel wall thickening or dilatation. Colonic diverticulosis. The appendix is not definitely identified with no inflammatory changes in the right lower quadrant to suggest acute appendicitis. Vascular/Lymphatic: No abdominal aorta or iliac aneurysm. Severe atherosclerotic plaque of the aorta and its branches. No abdominal, pelvic, or inguinal lymphadenopathy. Reproductive: Status post hysterectomy. No adnexal masses. Other: No intraperitoneal free fluid. No intraperitoneal free gas. No organized fluid collection. Musculoskeletal: No abdominal wall hernia or abnormality. Diffusely decreased bone density. No suspicious lytic or blastic osseous lesions. No acute displaced fracture. Grade 1 anterolisthesis of L5 on S1. Multilevel degenerative changes spine. Screw fixation of the left femoral neck. IMPRESSION: 1. No acute intra-abdominal or intrapelvic abnormality.Colonic diverticulosis with no acute diverticulitis. 2.  Aortic Atherosclerosis (ICD10-I70.0). Electronically Signed   By: Tish Frederickson M.D.   On: 11/12/2023 20:14   MR BRAIN WO  CONTRAST Result Date: 11/12/2023 CLINICAL DATA:  Encephalopathy EXAM: MRI HEAD WITHOUT CONTRAST TECHNIQUE: Multiplanar, multiecho pulse sequences of the brain and surrounding structures were obtained without intravenous contrast. COMPARISON:  11/12/2023 CT head, 07/29/2023 MRI head FINDINGS: Brain: No restricted diffusion to suggest acute or subacute infarct. No acute hemorrhage, mass, mass effect, or midline shift. No hydrocephalus or extra-axial collection. Pituitary and craniocervical junction within normal limits. No hemosiderin deposition to suggest remote hemorrhage. Age related cerebral atrophy. Ex vacuo dilatation of the ventricles. Confluent T2 hyperintense signal in the periventricular white matter, likely the sequela of moderate to severe chronic small vessel ischemic disease. 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 Vascular: Normal arterial flow voids. Skull and upper cervical spine: Normal marrow signal. Sinuses/Orbits: Mucosal thickening in the ethmoid air cells. Air-fluid level in the left sphenoid sinus. Status post bilateral lens replacements. No acute finding in the orbits. Other: Trace fluid in the bilateral mastoid air cells. IMPRESSION: 1. No acute intracranial process. No evidence of acute or subacute infarct. 2. Air-fluid level in the left sphenoid sinus, which can be seen in the setting of acute sinusitis. Electronically Signed   By: Wiliam Ke M.D.   On: 11/12/2023 18:16   CT Head Wo Contrast Result Date: 11/12/2023 CLINICAL DATA:  Mental status change EXAM: CT HEAD WITHOUT CONTRAST TECHNIQUE: Contiguous axial images were obtained from the  base of the skull through the vertex without intravenous contrast. RADIATION DOSE REDUCTION: This exam was performed according to the departmental dose-optimization program which includes automated exposure control, adjustment of the mA and/or kV according to patient size and/or use of iterative reconstruction technique. COMPARISON:   11/08/2023 FINDINGS: Brain: No evidence of acute infarction, hemorrhage, mass, mass effect, or midline shift. No hydrocephalus or extra-axial fluid collection. Age related cerebral atrophy, Periventricular white matter changes, likely the sequela of chronic small vessel ischemic disease. 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 Vascular: No hyperdense vessel. Atherosclerotic calcifications in the intracranial carotid and vertebral arteries. Skull: Negative for fracture or focal lesion. Sinuses/Orbits: Mucosal thickening in the anterior ethmoid air cells and inferior frontal sinuses. Air-fluid level in the left sphenoid sinus. No acute finding in the orbits. Status post bilateral lens replacements. Other: Trace fluid in the mastoid air cells. IMPRESSION: No acute intracranial process. Electronically Signed   By: Wiliam Ke M.D.   On: 11/12/2023 14:30   DG Chest Port 1 View Result Date: 11/12/2023 CLINICAL DATA:  Altered mental status and generalized weakness. EXAM: PORTABLE CHEST 1 VIEW COMPARISON:  Chest radiograph dated 11/08/2023. FINDINGS: No focal consolidation, pleural effusion, or pneumothorax. The cardiac silhouette is within normal limits. The aorta is tortuous. No acute osseous pathology. IMPRESSION: No active disease. Electronically Signed   By: Elgie Collard M.D.   On: 11/12/2023 11:09   IMPRESSION:  *78 year old female with complaints of nausea and vomiting.  Some dementia at baseline, but apparently had increased confusion and weakness with these episodes of vomiting.  Recently admitted for the same for 1 day but improved and discharged quickly and did ok for a couple of days but then symptoms recurred.  CT scan abdomen and pelvis negative.  TSH normal.  Ammonia level normal.  AM cortisol was low at 4.5 during last hospitalization so has been placed on steroids.  ? If these symptoms are due to adrenal insufficiency vs GERD or other UGI source. *Hyponatremia with Na+ of 132 and  hypokalemia with K+ of 3.1  PLAN: -EGD on 1/30. -Will increase pantoprazole from 40 mg IV daily to BID for now. -Antiemetics prn. -Agree with steroids and ACTH stim test.   Princella Pellegrini. Zehr  11/13/2023, 10:19 AM  I have taken an interval history, thoroughly reviewed the chart and examined the patient. I agree with the Advanced Practitioner's note, impression and recommendations, and have recorded additional findings, impressions and recommendations below. I performed a substantive portion of this encounter (>50% time spent), including a complete performance of the medical decision making.  My additional thoughts are as follows:  Not clear if this vomiting is from a primary GI condition or perhaps ancillary some other systemic condition.  Low cortisol level noted, workup in progress. Could have been a recent viral gastroenteritis with some protracted stuttering symptoms. Chronic hyponatremia, somewhat worse lately, and that appears at least partially due to vomiting related volume contraction.  Upper endoscopy tomorrow to rule out a source of gastric outlet obstruction, ulcer or other upper GI mucosal disease.  No mass or SBO on CTAP.  Daughter-in-law is with her today, and says that her son is power of attorney and can be contacted for consent due to the patient's dementia.   Charlie Pitter III Office:[phone]

## 2023-11-13 NOTE — H&P (View-Only) (Signed)
Referring Provider: [facility], Dr. Renford Dills Primary Care Physician:  Marguarite Arbour, MD Primary Gastroenterologist:  ? Dr. Mechele Collin at [facility] remotely  Reason for Consultation:  Nausea and vomiting  HPI: Charlene Thomas is a 78 y.o. female with medical history significant of dementia, hypertension, palpitations, hypothyroidism, GERD, and arthritis who presented here from home with complaints of confusion, nausea and vomiting.  Patient was admitted with same symptoms on 1/24 and was discharged on 1/25.  She was doing okay after discharge but she started having several episode of vomiting, became confused.  Her daughter-in-law said that after the shower yesterday evening, she threw up multiple times, vomiting was projectile in nature.   TSH normal.  Ammonia level normal.  AM cortisol was low at 4.5 during last hospitalization.  I have ordered some hydrocortisone twice daily and an ACTH stim test for tomorrow. Hyponatremia with Na+ of 132 and hypokalemia with K+ of 3.1  CT scan of the abdomen and pelvis with contrast showed no acute intra-abdominal abnormalities.  MRI head without any acute issues.  She denies abdominal pain.  The daughter-in-law reports that she has had no diarrhea, but they ordered and collected a stool GI pathogen panel here.  Patient does take omeprazole 20 mg twice daily at home for several years.  Daughter-in-law tells me that patient has not had any recent GI history, anything would have been very remote.   Past Medical History:  Diagnosis Date   Anginal pain (HCC)    Arthritis    Dysrhythmia    TACHYCARDIA   GERD (gastroesophageal reflux disease)    Heart palpitations    Hypertension    Hypothyroidism     Past Surgical History:  Procedure Laterality Date   ABDOMINAL HYSTERECTOMY     CATARACT EXTRACTION W/PHACO Left 05/15/2016   Procedure: CATARACT EXTRACTION PHACO AND INTRAOCULAR LENS PLACEMENT (IOC);  Surgeon: Galen Manila, MD;  Location: [facility] ORS;  Service:  Ophthalmology;  Laterality: Left;  [place] 00:34 AP% 20.1 CDE 6.98 Fluid pack lot # [id number]   CATARACT EXTRACTION W/PHACO Right 06/12/2016   Procedure: CATARACT EXTRACTION PHACO AND INTRAOCULAR LENS PLACEMENT (IOC);  Surgeon: Galen Manila, MD;  Location: [facility] ORS;  Service: Ophthalmology;  Laterality: Right;  Lot# [id number] [place]: 1:05.5 AP%:45.8 CDE:12.87   CESAREAN SECTION     X 3   CHOLECYSTECTOMY     COMPRESSION HIP SCREW Left 03/08/2023   Procedure: Cannulated Screw Fixation of the Left Femoral Neck Fracture;  Surgeon: Christena Flake, MD;  Location: [facility] ORS;  Service: Orthopedics;  Laterality: Left;   GRAFTS     SKIN GRAFTS FOR BURNS   KIDNEY STONE SURGERY     THROAT SURGERY      Prior to Admission medications   Medication Sig Start Date End Date Taking? Authorizing Provider  calcium carbonate (CALCIUM 600) 600 MG TABS tablet Take 1 tablet (600 mg total) by mouth 2 (two) times daily with a meal. Patient taking differently: Take 600 mg by mouth in the morning. 03/10/23  Yes Wieting, Richard, MD  cetirizine (ZYRTEC) 10 MG tablet Take 10 mg by mouth in the morning.   Yes [provider]  cyanocobalamin (VITAMIN B12) 1000 MCG tablet Take 1,000 mcg by mouth at bedtime.   Yes [provider]  desvenlafaxine (PRISTIQ) 50 MG 24 hr tablet Take 50 mg by mouth in the morning.   Yes [provider]  diltiazem (CARDIZEM CD) 240 MG 24 hr capsule Take 240 mg by mouth in the morning.  01/02/23  Yes [provider]  levothyroxine (SYNTHROID) 125 MCG tablet Take 0.5 tablets by mouth daily. 12/28/22  Yes [provider]  losartan (COZAAR) 100 MG tablet Take 100 mg by mouth at bedtime. 12/28/22  Yes [provider]  metoprolol tartrate (LOPRESSOR) 25 MG tablet Take 25 mg by mouth 2 (two) times daily. 12/28/22  Yes [provider]  modafinil (PROVIGIL) 100 MG tablet Take 100 mg by mouth in the morning.   Yes [provider]  Multiple  Vitamins-Minerals (WOMENS MULTIVITAMIN GUMMIES PO) Take 2 each by mouth in the morning. Vitafusion   Yes [provider]  omeprazole (PRILOSEC) 20 MG capsule Take 20 mg by mouth 2 (two) times daily. 02/19/16  Yes [provider]  Potassium 99 MG TABS Take 1 tablet by mouth in the morning.   Yes [provider]  pravastatin (PRAVACHOL) 40 MG tablet Take 1 tablet (40 mg total) by mouth daily. Patient taking differently: Take 40 mg by mouth at bedtime. 01/28/23  Yes Delfino Lovett, MD  Vitamin D, Ergocalciferol, (DRISDOL) 1.25 MG (50000 UNIT) CAPS capsule Take 50,000 Units by mouth once a week. On Sundays 02/11/23  Yes [provider]  acetaminophen (TYLENOL) 500 MG tablet Take 1-2 tablets (500-1,000 mg total) by mouth every 6 (six) hours as needed for mild pain. Patient not taking: Reported on 11/12/2023 03/10/23   Anson Oregon, PA-C  albuterol (VENTOLIN HFA) 108 (90 Base) MCG/ACT inhaler Inhale 2 puffs into the lungs every 6 (six) hours as needed for wheezing or shortness of breath. Patient not taking: Reported on 11/12/2023 03/10/23   Alford Highland, MD  alendronate (FOSAMAX) 70 MG tablet Take 70 mg by mouth once a week. Take with a full glass of water on an empty stomach. On Mondays Patient not taking: Reported on 11/12/2023    [provider]  baclofen (LIORESAL) 20 MG tablet Take 20 mg by mouth 2 (two) times daily as needed for muscle spasms. Patient not taking: Reported on 11/12/2023 09/24/23   [provider]  ondansetron (ZOFRAN) 4 MG tablet Take 4 mg by mouth every 8 (eight) hours as needed for nausea or vomiting. Patient not taking: Reported on 11/12/2023    [provider]    Current Facility-Administered Medications  Medication Dose Route Frequency Provider Last Rate Last Admin   0.9 %  sodium chloride infusion   Intravenous Continuous Burnadette Pop, MD 75 mL/hr at 11/13/23 1002 New Bag at 11/13/23 1002   acetaminophen  (TYLENOL) tablet 500-1,000 mg  500-1,000 mg Oral Q6H PRN Burnadette Pop, MD       [START ON 11/14/2023] cosyntropin (CORTROSYN) injection 0.25 mg  0.25 mg Intravenous Once Adhikari, Amrit, MD       enoxaparin (LOVENOX) injection 40 mg  40 mg Subcutaneous Q24H Adhikari, Amrit, MD   40 mg at 11/12/23 1838   hydrocortisone (CORTEF) tablet 5 mg  5 mg Oral BID Burnadette Pop, MD   5 mg at 11/12/23 2349   levothyroxine (SYNTHROID) tablet 62.5 mcg  62.5 mcg Oral Q0600 Burnadette Pop, MD   62.5 mcg at 11/13/23 0858   ondansetron (ZOFRAN) tablet 4 mg  4 mg Oral Q6H PRN Burnadette Pop, MD       Or   ondansetron (ZOFRAN) injection 4 mg  4 mg Intravenous Q6H PRN Burnadette Pop, MD   4 mg at 11/13/23 0644   pantoprazole (PROTONIX) injection 40 mg  40 mg Intravenous Q24H Burnadette Pop, MD   40 mg  at 11/12/23 1838   potassium chloride 10 mEq in 100 mL IVPB  10 mEq Intravenous Q1 Hr x 5 Adhikari, Amrit, MD 100 mL/hr at 11/13/23 1001 10 mEq at 11/13/23 1001   pravastatin (PRAVACHOL) tablet 40 mg  40 mg Oral Daily Burnadette Pop, MD       Current Outpatient Medications  Medication Sig Dispense Refill   calcium carbonate (CALCIUM 600) 600 MG TABS tablet Take 1 tablet (600 mg total) by mouth 2 (two) times daily with a meal. (Patient taking differently: Take 600 mg by mouth in the morning.) 60 tablet 0   cetirizine (ZYRTEC) 10 MG tablet Take 10 mg by mouth in the morning.     cyanocobalamin (VITAMIN B12) 1000 MCG tablet Take 1,000 mcg by mouth at bedtime.     desvenlafaxine (PRISTIQ) 50 MG 24 hr tablet Take 50 mg by mouth in the morning.     diltiazem (CARDIZEM CD) 240 MG 24 hr capsule Take 240 mg by mouth in the morning.     levothyroxine (SYNTHROID) 125 MCG tablet Take 0.5 tablets by mouth daily.     losartan (COZAAR) 100 MG tablet Take 100 mg by mouth at bedtime.     metoprolol tartrate (LOPRESSOR) 25 MG tablet Take 25 mg by mouth 2 (two) times daily.     modafinil (PROVIGIL) 100 MG tablet Take 100 mg by  mouth in the morning.     Multiple Vitamins-Minerals (WOMENS MULTIVITAMIN GUMMIES PO) Take 2 each by mouth in the morning. Vitafusion     omeprazole (PRILOSEC) 20 MG capsule Take 20 mg by mouth 2 (two) times daily.  4   Potassium 99 MG TABS Take 1 tablet by mouth in the morning.     pravastatin (PRAVACHOL) 40 MG tablet Take 1 tablet (40 mg total) by mouth daily. (Patient taking differently: Take 40 mg by mouth at bedtime.) 30 tablet 3   Vitamin D, Ergocalciferol, (DRISDOL) 1.25 MG (50000 UNIT) CAPS capsule Take 50,000 Units by mouth once a week. On Sundays     acetaminophen (TYLENOL) 500 MG tablet Take 1-2 tablets (500-1,000 mg total) by mouth every 6 (six) hours as needed for mild pain. (Patient not taking: Reported on 11/12/2023) 60 tablet 0   albuterol (VENTOLIN HFA) 108 (90 Base) MCG/ACT inhaler Inhale 2 puffs into the lungs every 6 (six) hours as needed for wheezing or shortness of breath. (Patient not taking: Reported on 11/12/2023) 18 g 0   alendronate (FOSAMAX) 70 MG tablet Take 70 mg by mouth once a week. Take with a full glass of water on an empty stomach. On Mondays (Patient not taking: Reported on 11/12/2023)     baclofen (LIORESAL) 20 MG tablet Take 20 mg by mouth 2 (two) times daily as needed for muscle spasms. (Patient not taking: Reported on 11/12/2023)     ondansetron (ZOFRAN) 4 MG tablet Take 4 mg by mouth every 8 (eight) hours as needed for nausea or vomiting. (Patient not taking: Reported on 11/12/2023)      Allergies as of 11/12/2023 - Review Complete 11/12/2023  Allergen Reaction Noted   Topiramate Other (See Comments) 01/27/2023   Aspirin  06/06/2016   Celecoxib Other (See Comments) 01/19/2014   Ezetimibe-simvastatin Other (See Comments) 01/19/2014   Mobic [meloxicam] Itching 04/20/2015   Raloxifene Other (See Comments) 01/19/2014    Family History  Problem Relation Age of Onset   Breast cancer Neg Hx     Social History   Socioeconomic History   Marital status:  Widowed    Spouse name: Not on file   Number of children: Not on file   Years of education: Not on file   Highest education level: Not on file  Occupational History   Not on file  Tobacco Use   Smoking status: Former   Smokeless tobacco: Never  Substance and Sexual Activity   Alcohol use: No   Drug use: No   Sexual activity: Not on file  Other Topics Concern   Not on file  Social History Narrative   Not on file   Social Drivers of Health   Financial Resource Strain: Not on file  Food Insecurity: Patient Unable To Answer (11/12/2023)   Hunger Vital Sign    Worried About Running Out of Food in the Last Year: Patient unable to answer    Ran Out of Food in the Last Year: Patient unable to answer  Transportation Needs: Patient Unable To Answer (11/12/2023)   PRAPARE - Transportation    Lack of Transportation (Medical): Patient unable to answer    Lack of Transportation (Non-Medical): Patient unable to answer  Physical Activity: Not on file  Stress: Not on file  Social Connections: Patient Unable To Answer (11/12/2023)   Social Connection and Isolation Panel [NHANES]    Frequency of Communication with Friends and Family: Patient unable to answer    Frequency of Social Gatherings with Friends and Family: Patient unable to answer    Attends Religious Services: Patient unable to answer    Active Member of Clubs or Organizations: Patient unable to answer    Attends Banker Meetings: Patient unable to answer    Marital Status: Patient unable to answer  Intimate Partner Violence: Patient Unable To Answer (11/12/2023)   Humiliation, Afraid, Rape, and Kick questionnaire    Fear of Current or Ex-Partner: Patient unable to answer    Emotionally Abused: Patient unable to answer    Physically Abused: Patient unable to answer    Sexually Abused: Patient unable to answer    Review of Systems: ROS is O/W negative except as mentioned in HPI.  Physical Exam: Vital signs in last  24 hours: Temp:  [98 F (36.7 C)-99.4 F (37.4 C)] 98.1 F (36.7 C) (01/29 0530) Pulse Rate:  [84-103] 84 (01/29 0800) Resp:  [14-23] 15 (01/29 0800) BP: (100-163)/(63-100) 122/76 (01/29 0800) SpO2:  [90 %-100 %] 98 % (01/29 0800)   General: Somnolent,, arouses somewhat, nontoxic-appearing Head:  Normocephalic and atraumatic. Eyes:  Sclera clear, no icterus.   Conjunctiva pink. Ears:  Normal auditory acuity. Mouth:  No deformity or lesions.   Lungs:  Clear throughout to auscultation.  No wheezes, crackles, or rhonchi.  Heart:  Regular rate and rhythm; no murmurs, clicks, rubs, or gallops. Abdomen:  Soft, non-distended.  BS present.  Non-tender. Msk:  Symmetrical without gross deformities. Pulses:  Normal pulses noted. Extremities:  Without clubbing or edema. Neurologic:  Sleepy but dose arouse and answers basic questions. Skin:  Intact without significant lesions or rashes.  Lab Results: Recent Labs    11/12/23 1002  WBC 8.2  HGB 11.2*  HCT 33.8*  PLT 243   BMET Recent Labs    11/12/23 1002 11/13/23 0629  NA 131* 132*  K 3.6 3.1*  CL 94* 98  CO2 23 23  GLUCOSE 108* 99  BUN 7* 7*  CREATININE 0.79 0.73  CALCIUM 8.9 8.3*   LFT Recent Labs    11/13/23 0629  PROT 5.8*  ALBUMIN 3.1*  AST 19  ALT 14  ALKPHOS 77  BILITOT 0.4   Studies/Results: CT ABDOMEN PELVIS W CONTRAST Result Date: 11/12/2023 CLINICAL DATA:  Bowel obstruction suspected Previously seen for low sodium levels and AMS recently. Taking prescribed med for sodium levels at home EXAM: CT ABDOMEN AND PELVIS WITH CONTRAST TECHNIQUE: Multidetector CT imaging of the abdomen and pelvis was performed using the standard protocol following bolus administration of intravenous contrast. RADIATION DOSE REDUCTION: This exam was performed according to the departmental dose-optimization program which includes automated exposure control, adjustment of the mA and/or kV according to patient size and/or use of iterative  reconstruction technique. CONTRAST:  60mL OMNIPAQUE IOHEXOL 350 MG/ML SOLN COMPARISON:  CT abdomen pelvis 01/26/2023, CT renal 04/18/2021 FINDINGS: Lower chest: No acute abnormality. Hepatobiliary: Subcentimeter hypodensities too small to characterize. Status post cholecystectomy. No biliary dilatation. Pancreas: No focal lesion. Normal pancreatic contour. No surrounding inflammatory changes. No main pancreatic ductal dilatation. Spleen: Normal in size without focal abnormality. Adrenals/Urinary Tract: No adrenal nodule bilaterally. Bilateral kidneys enhance symmetrically. No hydronephrosis. No hydroureter. The urinary bladder is unremarkable. On delayed imaging, there is no urothelial wall thickening and there are no filling defects in the opacified portions of the bilateral collecting systems or ureters. Stomach/Bowel: Stomach is within normal limits. No evidence of bowel wall thickening or dilatation. Colonic diverticulosis. The appendix is not definitely identified with no inflammatory changes in the right lower quadrant to suggest acute appendicitis. Vascular/Lymphatic: No abdominal aorta or iliac aneurysm. Severe atherosclerotic plaque of the aorta and its branches. No abdominal, pelvic, or inguinal lymphadenopathy. Reproductive: Status post hysterectomy. No adnexal masses. Other: No intraperitoneal free fluid. No intraperitoneal free gas. No organized fluid collection. Musculoskeletal: No abdominal wall hernia or abnormality. Diffusely decreased bone density. No suspicious lytic or blastic osseous lesions. No acute displaced fracture. Grade 1 anterolisthesis of L5 on S1. Multilevel degenerative changes spine. Screw fixation of the left femoral neck. IMPRESSION: 1. No acute intra-abdominal or intrapelvic abnormality.Colonic diverticulosis with no acute diverticulitis. 2.  Aortic Atherosclerosis (ICD10-I70.0). Electronically Signed   By: Tish Frederickson M.D.   On: 11/12/2023 20:14   MR BRAIN WO  CONTRAST Result Date: 11/12/2023 CLINICAL DATA:  Encephalopathy EXAM: MRI HEAD WITHOUT CONTRAST TECHNIQUE: Multiplanar, multiecho pulse sequences of the brain and surrounding structures were obtained without intravenous contrast. COMPARISON:  11/12/2023 CT head, 07/29/2023 MRI head FINDINGS: Brain: No restricted diffusion to suggest acute or subacute infarct. No acute hemorrhage, mass, mass effect, or midline shift. No hydrocephalus or extra-axial collection. Pituitary and craniocervical junction within normal limits. No hemosiderin deposition to suggest remote hemorrhage. Age related cerebral atrophy. Ex vacuo dilatation of the ventricles. Confluent T2 hyperintense signal in the periventricular white matter, likely the sequela of moderate to severe chronic small vessel ischemic disease. 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 Vascular: Normal arterial flow voids. Skull and upper cervical spine: Normal marrow signal. Sinuses/Orbits: Mucosal thickening in the ethmoid air cells. Air-fluid level in the left sphenoid sinus. Status post bilateral lens replacements. No acute finding in the orbits. Other: Trace fluid in the bilateral mastoid air cells. IMPRESSION: 1. No acute intracranial process. No evidence of acute or subacute infarct. 2. Air-fluid level in the left sphenoid sinus, which can be seen in the setting of acute sinusitis. Electronically Signed   By: Wiliam Ke M.D.   On: 11/12/2023 18:16   CT Head Wo Contrast Result Date: 11/12/2023 CLINICAL DATA:  Mental status change EXAM: CT HEAD WITHOUT CONTRAST TECHNIQUE: Contiguous axial images were obtained from the  base of the skull through the vertex without intravenous contrast. RADIATION DOSE REDUCTION: This exam was performed according to the departmental dose-optimization program which includes automated exposure control, adjustment of the mA and/or kV according to patient size and/or use of iterative reconstruction technique. COMPARISON:   11/08/2023 FINDINGS: Brain: No evidence of acute infarction, hemorrhage, mass, mass effect, or midline shift. No hydrocephalus or extra-axial fluid collection. Age related cerebral atrophy, Periventricular white matter changes, likely the sequela of chronic small vessel ischemic disease. 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 Vascular: No hyperdense vessel. Atherosclerotic calcifications in the intracranial carotid and vertebral arteries. Skull: Negative for fracture or focal lesion. Sinuses/Orbits: Mucosal thickening in the anterior ethmoid air cells and inferior frontal sinuses. Air-fluid level in the left sphenoid sinus. No acute finding in the orbits. Status post bilateral lens replacements. Other: Trace fluid in the mastoid air cells. IMPRESSION: No acute intracranial process. Electronically Signed   By: Wiliam Ke M.D.   On: 11/12/2023 14:30   DG Chest Port 1 View Result Date: 11/12/2023 CLINICAL DATA:  Altered mental status and generalized weakness. EXAM: PORTABLE CHEST 1 VIEW COMPARISON:  Chest radiograph dated 11/08/2023. FINDINGS: No focal consolidation, pleural effusion, or pneumothorax. The cardiac silhouette is within normal limits. The aorta is tortuous. No acute osseous pathology. IMPRESSION: No active disease. Electronically Signed   By: Elgie Collard M.D.   On: 11/12/2023 11:09   IMPRESSION:  *78 year old female with complaints of nausea and vomiting.  Some dementia at baseline, but apparently had increased confusion and weakness with these episodes of vomiting.  Recently admitted for the same for 1 day but improved and discharged quickly and did ok for a couple of days but then symptoms recurred.  CT scan abdomen and pelvis negative.  TSH normal.  Ammonia level normal.  AM cortisol was low at 4.5 during last hospitalization so has been placed on steroids.  ? If these symptoms are due to adrenal insufficiency vs GERD or other UGI source. *Hyponatremia with Na+ of 132 and  hypokalemia with K+ of 3.1  PLAN: -EGD on 1/30. -Will increase pantoprazole from 40 mg IV daily to BID for now. -Antiemetics prn. -Agree with steroids and ACTH stim test.   Princella Pellegrini. Zehr  11/13/2023, 10:19 AM  I have taken an interval history, thoroughly reviewed the chart and examined the patient. I agree with the Advanced Practitioner's note, impression and recommendations, and have recorded additional findings, impressions and recommendations below. I performed a substantive portion of this encounter (>50% time spent), including a complete performance of the medical decision making.  My additional thoughts are as follows:  Not clear if this vomiting is from a primary GI condition or perhaps ancillary some other systemic condition.  Low cortisol level noted, workup in progress. Could have been a recent viral gastroenteritis with some protracted stuttering symptoms. Chronic hyponatremia, somewhat worse lately, and that appears at least partially due to vomiting related volume contraction.  Upper endoscopy tomorrow to rule out a source of gastric outlet obstruction, ulcer or other upper GI mucosal disease.  No mass or SBO on CTAP.  Daughter-in-law is with her today, and says that her son is power of attorney and can be contacted for consent due to the patient's dementia.   Charlie Pitter III Office:[phone]

## 2023-11-13 NOTE — Evaluation (Addendum)
Physical Therapy Evaluation Patient Details Name: Charlene Thomas MRN: [id number] DOB: Jul 03, 1946 Today's Date: 11/13/2023  History of Present Illness  78 y.o. female who presented here from home with complaint of confusion, nausea, vomiting, diarrhea. PMH significant of dementia, hypertension, palpitations, hypothyroidism, GERD, arthritis  Clinical Impression   Pt admitted secondary to problem above with deficits below. PTA patient was living at home with daughter. Ambulating with quad cane "because the rolling walkers get away from her and she falls more."  Pt currently requires mod assist for bed mobility and transfers to/from [facility] (continues to have diarrhea). Felt too weak to ambulate.  Anticipate patient will benefit from PT to address problems listed below. Will continue to follow acutely to maximize functional mobility independence and safety. May benefit from wheelchair for in/out of home using ramp. Depending on progress, may need medical transport for discharge home.           If plan is discharge home, recommend the following: A lot of help with walking and/or transfers;A lot of help with bathing/dressing/bathroom;Direct supervision/assist for medications management;Direct supervision/assist for financial management;Help with stairs or ramp for entrance;Supervision due to cognitive status   Can travel by private vehicle        Equipment Recommendations Wheelchair (measurements PT)  Recommendations for Other Services       Functional Status Assessment Patient has had a recent decline in their functional status and demonstrates the ability to make significant improvements in function in a reasonable and predictable amount of time.     Precautions / Restrictions Precautions Precautions: Fall      Mobility  Bed Mobility Overal bed mobility: Needs Assistance Bed Mobility: Supine to Sit, Sit to Supine     Supine to sit: Mod assist, HOB elevated (ED stretcher) Sit to  supine: Mod assist, HOB elevated   General bed mobility comments: daughter provided assist (as she does at home)    Transfers Overall transfer level: Needs assistance Equipment used: None Transfers: Sit to/from Stand, Bed to chair/wheelchair/BSC Sit to Stand: Min assist   Step pivot transfers: Mod assist       General transfer comment: bed to BSC min assist with pt holding onto arm of BSC; BSC to bed mod assist with pt holding onto daughter's arms "bear hug-style"; difficulty advancing feet in gripper socks    Ambulation/Gait               General Gait Details: deferred; pt feeling too weak  Stairs            Wheelchair Mobility     Tilt Bed    Modified Rankin (Stroke Patients Only)       Balance Overall balance assessment: Needs assistance Sitting-balance support: Bilateral upper extremity supported, Feet unsupported Sitting balance-Leahy Scale: Poor Sitting balance - Comments: initial rt, posterior lean   Standing balance support: Bilateral upper extremity supported, During functional activity Standing balance-Leahy Scale: Poor                               Pertinent Vitals/Pain Pain Assessment Pain Assessment: No/denies pain    Home Living Family/patient expects to be discharged to:: Private residence Living Arrangements: Children (Daughter) Available Help at Discharge: Family;Available 24 hours/day Type of Home: House Home Access: Ramped entrance       Home Layout: Two level;Able to live on main level with bedroom/bathroom Home Equipment: Rolling Walker (2 wheels);Rollator (4 wheels);Cane - single point;Grab bars -  toilet;Grab bars - tub/shower;Cane - quad;Shower seat Additional Comments: daughter present and provided information    Prior Function Prior Level of Function : History of Falls (last six months);Needs assist             Mobility Comments: fell ~ 1 week ago; got up unassisted (not witnessed)j ADLs Comments:  assist with bathing and dressing     Extremity/Trunk Assessment   Upper Extremity Assessment Upper Extremity Assessment: Defer to OT evaluation    Lower Extremity Assessment Lower Extremity Assessment: Generalized weakness (grossly 3+ to 4)    Cervical / Trunk Assessment Cervical / Trunk Assessment: Normal  Communication   Communication Communication: No apparent difficulties  Cognition Arousal: Alert Behavior During Therapy: Flat affect Overall Cognitive Status: History of cognitive impairments - at baseline                                 General Comments: daughter present        General Comments General comments (skin integrity, edema, etc.): Pt needing to use BSC with again diarrhea.    Exercises General Exercises - Lower Extremity Heel Slides: AROM, Both, 5 reps, Supine (prior to OOB)   Assessment/Plan    PT Assessment Patient needs continued PT services  PT Problem List Decreased strength;Decreased activity tolerance;Decreased balance;Decreased mobility;Decreased cognition;Decreased knowledge of use of DME;Decreased safety awareness;Decreased knowledge of precautions       PT Treatment Interventions DME instruction;Gait training;Functional mobility training;Therapeutic activities;Therapeutic exercise;Balance training;Cognitive remediation;Patient/family education    PT Goals (Current goals can be found in the Care Plan section)  Acute Rehab PT Goals Patient Stated Goal: none stated; daughter for pt to get stronger and return home PT Goal Formulation: With patient/family Time For Goal Achievement: 11/27/23 Potential to Achieve Goals: Good    Frequency Min 1X/week     Co-evaluation               AM-PAC PT "6 Clicks" Mobility  Outcome Measure Help needed turning from your back to your side while in a flat bed without using bedrails?: A Lot Help needed moving from lying on your back to sitting on the side of a flat bed without using  bedrails?: A Lot Help needed moving to and from a bed to a chair (including a wheelchair)?: A Lot Help needed standing up from a chair using your arms (e.g., wheelchair or bedside chair)?: A Lot Help needed to walk in hospital room?: Total Help needed climbing 3-5 steps with a railing? : Total 6 Click Score: 10    End of Session   Activity Tolerance: Patient limited by fatigue Patient left: in bed;with call bell/phone within reach;with nursing/sitter in room;with family/visitor present Nurse Communication: Mobility status PT Visit Diagnosis: Unsteadiness on feet (R26.81);Muscle weakness (generalized) (M62.81);Difficulty in walking, not elsewhere classified (R26.2)    Time: 4540-9811 PT Time Calculation (min) (ACUTE ONLY): 28 min   Charges:   PT Evaluation $PT Eval Low Complexity: 1 Low PT Treatments $Gait Training: 8-22 mins PT General Charges $$ ACUTE PT VISIT: 1 Visit          Jerolyn Center, PT Acute Rehabilitation Services  Office [phone]   Zena Amos 11/13/2023, 9:14 AM

## 2023-11-13 NOTE — Progress Notes (Signed)
    Durable Medical Equipment  (From admission, onward)           Start     Ordered   11/13/23 1613  For home use only DME standard manual wheelchair with seat cushion  Once       Comments: Patient suffers from generalized weakness, dementia which impairs their ability to perform daily activities like toileting in the home.  A walker will not resolve issue with performing activities of daily living. A wheelchair will allow patient to safely perform daily activities. Patient can safely propel the wheelchair in the home or has a caregiver who can provide assistance. Length of need 12 months . Accessories: elevating leg rests (ELRs), wheel locks, extensions and anti-tippers.   11/13/23 1613

## 2023-11-13 NOTE — Progress Notes (Signed)
Transition of Care [facility]) - Inpatient Brief Assessment   Patient Details  Name: Charlene Thomas MRN: [id number] Date of Birth: Nov 06, 1945  Transition of Care [facility]) CM/SW Contact:    Charlene Bridgeman, RN Phone Number: 11/13/2023, 4:19 PM   Clinical Narrative: Cm met with the patient and daughter at the bedside to discuss TOC needs.   The patient admitted to the hospital for confusion, nausea/vomiting.  Patient's daughter, Charlene Thomas, lives with her at the home and provides care.  Patient was recently active with [facility] and the patient's daughter was offered Medicare choice regarding home health and the daughter was pleased with [facility] and wanted services continued.  HH orders placed and [facility] was called and Kandee Keen, accepted for services.  DME in the home includes quad cane, RW, BSC.  Patient needs wheelchair and daughter requested delivery of Wc to the home.  Daughter was offered Medicare choice regarding DME and daughter did not have a preference.  I called [employer] and requested delivery of WC to the home.  No other TOC needs.  Patient is currently on 2 L/min [place] and patient is normally on room air at home.   Transition of Care Asessment: Insurance and Status: (P) Insurance coverage has been reviewed Patient has primary care physician: (P) Yes Home environment has been reviewed: (P) from home with daughter Prior level of function:: (P) family assistance - uses [employer] Home Services: (P) No current home services Social Drivers of Health Review: (P) SDOH reviewed interventions complete Readmission risk has been reviewed: (P) Yes Transition of care needs: (P) transition of care needs identified, TOC will continue to follow

## 2023-11-13 NOTE — Progress Notes (Signed)
PROGRESS NOTE  Charlene Thomas  YQM:[id number] DOB: October 13, 1946 DOA: 11/12/2023 PCP: Marguarite Arbour, MD   Brief Narrative: Charlene Thomas is a 78 y.o. female with medical history significant of dementia, hypertension, palpitations, hypothyroidism, GERD, arthritis who presented here from home with complaint of confusion, nausea, vomiting.  Patient was admitted with same symptoms on 1/24 and was discharged on 1/25. She was doing okay after discharge but she started having several episode of vomiting, became confused .  Vomiting was projectile.  After admission, she also started having loose stools.  GI pathogen panel sent.  GI consulted, plan for EGD tomorrow  Assessment & Plan:  Principal Problem:   AMS (altered mental status) Active Problems:   Hyponatremia   Dementia with behavioral disturbance (HCC)   Hypertension   Hypothyroidism   GERD (gastroesophageal reflux disease)   Dyslipidemia   Low serum cortisol level   Nausea & vomiting  Altered mental status on the background of dementia: Unclear etiology.  Patient was admitted for the same last time.  Patient has dementia and is confused at baseline but she was thought to be more confused, less verbal, also had intermittent weakness on the left side..  CT head showed no acute findings.  Normal ammonia level,.  MRI of the brain did not show any acute findings. TSH checked on 1/24 was within normal limit. Continue frequent orientation, delirium precautions. Patient takes desvenlafaxine, modafinil, baclofen as needed.  Will hold this medicines for now. Patient lives with her daughter, ambulatory with cane at home.  She is confused at baseline.  I think she is close to her baseline now   Nausea/vomiting/diarrhea: Was admitted with this problem on last admission.  No fever or leukocytosis.  Currently not on antibiotics.  No report of abdominal pain .  Continue antiemetics, gentle IV fluids.  Abdomen is benign on examination.  CT  abdomen/pelvis did not show any acute findings.  GI consulted.  Started on Protonix.  Plan for EGD.  GI pathogen panel will be followed  Low serum cortisol level: During last admission, a.m. cortisol level was checked and it was 4.5.Briefly started  Cortef, but now held, plan for ACTH stimulation test.     Hyponatremia: Mild, better than last readings.  Continue to monitor.  Potassium supplemented   History of hypertension: Takes metoprolol, Cardizem, losartan at home.  Currently blood pressure stable.  Continue to hold this medicine for now.   Hypothyroidism: Continue Synthyroid   Hyperlipidemia: On pravastatin   GERD: Continue PPI   Debility/deconditioning: Patient lives with daughter.  Consulted PT/OT, recommended home with home health on discharge         DVT prophylaxis:enoxaparin (LOVENOX) injection 40 mg Start: 11/12/23 1530     Code Status: Full Code  Family Communication: Daughter at bedside on 1/29  Patient status:obs  Patient is from :home  Anticipated discharge BM:WUXL   Estimated DC date: 1 to 2 days   Consultants: GI  Procedures: Plan for EGD  Antimicrobials:  Anti-infectives (From admission, onward)    None       Subjective: Patient seen and examined at bedside today.  She was lying in bed.  Appears overall comfortable.  Hemodynamically stable.  Daughter at bedside.  Daughter states that she had 2 episode of nausea since yesterday and 4 episodes of loose stools.  GI pathogen panel collected.  Her abdomen is benign, no abdominal pain  Objective: Vitals:   11/13/23 1000 11/13/23 1100 11/13/23 1200 11/13/23 1208  BP:  119/76 119/69 131/78   Pulse: 85 81 82   Resp: 15 15 16    Temp:    98.7 F (37.1 C)  TempSrc:    Oral  SpO2: 92% 96% 97%   Weight:      Height:       No intake or output data in the 24 hours ending 11/13/23 1323 Filed Weights   11/12/23 0946  Weight: 58.9 kg    Examination:  General exam: Overall comfortable, not in  distress, deconditioned, lying in bed HEENT: PERRL Respiratory system:  no wheezes or crackles  Cardiovascular system: S1 & S2 heard, RRR.  Gastrointestinal system: Abdomen is nondistended, soft and nontender. Central nervous system: Alert and awake, oriented to place only, follows commands Extremities: No edema, no clubbing ,no cyanosis Skin: No rashes, no ulcers,no icterus     Data Reviewed: I have personally reviewed following labs and imaging studies  CBC: Recent Labs  Lab 11/08/23 1900 11/08/23 1910 11/08/23 2159 11/09/23 0402 11/12/23 1002  WBC 8.0  --  7.5 6.4 8.2  NEUTROABS 6.5  --   --   --  7.5  HGB 11.7* 12.9 11.2* 10.7* 11.2*  HCT 35.5* 38.0 34.6* 33.1* 33.8*  MCV 81.1  --  81.6 81.5 82.0  PLT 321  --  261 284 243   Basic Metabolic Panel: Recent Labs  Lab 11/08/23 1900 11/08/23 1910 11/08/23 2159 11/09/23 0402 11/12/23 1002 11/13/23 0628 11/13/23 0629  NA 124* 126* 126* 130* 131*  --  132*  K 4.3 4.3 4.2 4.1 3.6  --  3.1*  CL 92* 92* 97* 98 94*  --  98  CO2 22  --  20* 24 23  --  23  GLUCOSE 111* 111* 102* 97 108*  --  99  BUN 7* 7* 6* <5* 7*  --  7*  CREATININE 0.85 0.80 0.75 0.81 0.79  --  0.73  CALCIUM 9.3  --  9.0 8.9 8.9  --  8.3*  MG  --   --   --   --  1.6* 2.2  --      Recent Results (from the past 240 hours)  Resp panel by RT-PCR (RSV, Flu A&B, Covid) Anterior Nasal Swab     Status: None   Collection Time: 11/08/23  7:58 PM   Specimen: Anterior Nasal Swab  Result Value Ref Range Status   SARS Coronavirus 2 by RT PCR NEGATIVE NEGATIVE Final   Influenza A by PCR NEGATIVE NEGATIVE Final   Influenza B by PCR NEGATIVE NEGATIVE Final    Comment: (NOTE) The Xpert Xpress SARS-CoV-2/FLU/RSV plus assay is intended as an aid in the diagnosis of influenza from Nasopharyngeal swab specimens and should not be used as a sole basis for treatment. Nasal washings and aspirates are unacceptable for Xpert Xpress SARS-CoV-2/FLU/RSV testing.  Fact Sheet  for Patients: BloggerCourse.com  Fact Sheet for Healthcare Providers: SeriousBroker.it  This test is not yet approved or cleared by the [place] FDA and has been authorized for detection and/or diagnosis of SARS-CoV-2 by FDA under an Emergency Use Authorization (EUA). This EUA will remain in effect (meaning this test can be used) for the duration of the COVID-19 declaration under Section 564(b)(1) of the Act, 21 U.S.C. section 360bbb-3(b)(1), unless the authorization is terminated or revoked.     Resp Syncytial Virus by PCR NEGATIVE NEGATIVE Final    Comment: (NOTE) Fact Sheet for Patients: BloggerCourse.com  Fact Sheet for Healthcare Providers: SeriousBroker.it  This test is  not yet approved or cleared by the [place] and has been authorized for detection and/or diagnosis of SARS-CoV-2 by FDA under an Emergency Use Authorization (EUA). This EUA will remain in effect (meaning this test can be used) for the duration of the COVID-19 declaration under Section 564(b)(1) of the Act, 21 U.S.C. section 360bbb-3(b)(1), unless the authorization is terminated or revoked.  Performed at [facility], [address] [place], [place] [address]   Resp panel by RT-PCR (RSV, Flu A&B, Covid) Anterior Nasal Swab     Status: None   Collection Time: 11/12/23  8:45 PM   Specimen: Anterior Nasal Swab  Result Value Ref Range Status   SARS Coronavirus 2 by RT PCR NEGATIVE NEGATIVE Final   Influenza A by PCR NEGATIVE NEGATIVE Final   Influenza B by PCR NEGATIVE NEGATIVE Final    Comment: (NOTE) The Xpert Xpress SARS-CoV-2/FLU/RSV plus assay is intended as an aid in the diagnosis of influenza from Nasopharyngeal swab specimens and should not be used as a sole basis for treatment. Nasal washings and aspirates are unacceptable for Xpert Xpress SARS-CoV-2/FLU/RSV testing.  Fact  Sheet for Patients: BloggerCourse.com  Fact Sheet for Healthcare Providers: SeriousBroker.it  This test is not yet approved or cleared by the [place] FDA and has been authorized for detection and/or diagnosis of SARS-CoV-2 by FDA under an Emergency Use Authorization (EUA). This EUA will remain in effect (meaning this test can be used) for the duration of the COVID-19 declaration under Section 564(b)(1) of the Act, 21 U.S.C. section 360bbb-3(b)(1), unless the authorization is terminated or revoked.     Resp Syncytial Virus by PCR NEGATIVE NEGATIVE Final    Comment: (NOTE) Fact Sheet for Patients: BloggerCourse.com  Fact Sheet for Healthcare Providers: SeriousBroker.it  This test is not yet approved or cleared by the [place] FDA and has been authorized for detection and/or diagnosis of SARS-CoV-2 by FDA under an Emergency Use Authorization (EUA). This EUA will remain in effect (meaning this test can be used) for the duration of the COVID-19 declaration under Section 564(b)(1) of the Act, 21 U.S.C. section 360bbb-3(b)(1), unless the authorization is terminated or revoked.  Performed at [facility], [address], [place], [place] [address]      Radiology Studies: CT ABDOMEN PELVIS W CONTRAST Result Date: 11/12/2023 CLINICAL DATA:  Bowel obstruction suspected Previously seen for low sodium levels and AMS recently. Taking prescribed med for sodium levels at home EXAM: CT ABDOMEN AND PELVIS WITH CONTRAST TECHNIQUE: Multidetector CT imaging of the abdomen and pelvis was performed using the standard protocol following bolus administration of intravenous contrast. RADIATION DOSE REDUCTION: This exam was performed according to the departmental dose-optimization program which includes automated exposure control, adjustment of the mA and/or kV according to patient size  and/or use of iterative reconstruction technique. CONTRAST:  60mL OMNIPAQUE IOHEXOL 350 MG/ML SOLN COMPARISON:  CT abdomen pelvis 01/26/2023, CT renal 04/18/2021 FINDINGS: Lower chest: No acute abnormality. Hepatobiliary: Subcentimeter hypodensities too small to characterize. Status post cholecystectomy. No biliary dilatation. Pancreas: No focal lesion. Normal pancreatic contour. No surrounding inflammatory changes. No main pancreatic ductal dilatation. Spleen: Normal in size without focal abnormality. Adrenals/Urinary Tract: No adrenal nodule bilaterally. Bilateral kidneys enhance symmetrically. No hydronephrosis. No hydroureter. The urinary bladder is unremarkable. On delayed imaging, there is no urothelial wall thickening and there are no filling defects in the opacified portions of the bilateral collecting systems or ureters. Stomach/Bowel: Stomach is within normal limits. No evidence of  bowel wall thickening or dilatation. Colonic diverticulosis. The appendix is not definitely identified with no inflammatory changes in the right lower quadrant to suggest acute appendicitis. Vascular/Lymphatic: No abdominal aorta or iliac aneurysm. Severe atherosclerotic plaque of the aorta and its branches. No abdominal, pelvic, or inguinal lymphadenopathy. Reproductive: Status post hysterectomy. No adnexal masses. Other: No intraperitoneal free fluid. No intraperitoneal free gas. No organized fluid collection. Musculoskeletal: No abdominal wall hernia or abnormality. Diffusely decreased bone density. No suspicious lytic or blastic osseous lesions. No acute displaced fracture. Grade 1 anterolisthesis of L5 on S1. Multilevel degenerative changes spine. Screw fixation of the left femoral neck. IMPRESSION: 1. No acute intra-abdominal or intrapelvic abnormality.Colonic diverticulosis with no acute diverticulitis. 2.  Aortic Atherosclerosis (ICD10-I70.0). Electronically Signed   By: Tish Frederickson M.D.   On: 11/12/2023 20:14   MR  BRAIN WO CONTRAST Result Date: 11/12/2023 CLINICAL DATA:  Encephalopathy EXAM: MRI HEAD WITHOUT CONTRAST TECHNIQUE: Multiplanar, multiecho pulse sequences of the brain and surrounding structures were obtained without intravenous contrast. COMPARISON:  11/12/2023 CT head, 07/29/2023 MRI head FINDINGS: Brain: No restricted diffusion to suggest acute or subacute infarct. No acute hemorrhage, mass, mass effect, or midline shift. No hydrocephalus or extra-axial collection. Pituitary and craniocervical junction within normal limits. No hemosiderin deposition to suggest remote hemorrhage. Age related cerebral atrophy. Ex vacuo dilatation of the ventricles. Confluent T2 hyperintense signal in the periventricular white matter, likely the sequela of moderate to severe chronic small vessel ischemic disease. 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 Vascular: Normal arterial flow voids. Skull and upper cervical spine: Normal marrow signal. Sinuses/Orbits: Mucosal thickening in the ethmoid air cells. Air-fluid level in the left sphenoid sinus. Status post bilateral lens replacements. No acute finding in the orbits. Other: Trace fluid in the bilateral mastoid air cells. IMPRESSION: 1. No acute intracranial process. No evidence of acute or subacute infarct. 2. Air-fluid level in the left sphenoid sinus, which can be seen in the setting of acute sinusitis. Electronically Signed   By: Wiliam Ke M.D.   On: 11/12/2023 18:16   CT Head Wo Contrast Result Date: 11/12/2023 CLINICAL DATA:  Mental status change EXAM: CT HEAD WITHOUT CONTRAST TECHNIQUE: Contiguous axial images were obtained from the base of the skull through the vertex without intravenous contrast. RADIATION DOSE REDUCTION: This exam was performed according to the departmental dose-optimization program which includes automated exposure control, adjustment of the mA and/or kV according to patient size and/or use of iterative reconstruction technique.  COMPARISON:  11/08/2023 FINDINGS: Brain: No evidence of acute infarction, hemorrhage, mass, mass effect, or midline shift. No hydrocephalus or extra-axial fluid collection. Age related cerebral atrophy, Periventricular white matter changes, likely the sequela of chronic small vessel ischemic disease. 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 Vascular: No hyperdense vessel. Atherosclerotic calcifications in the intracranial carotid and vertebral arteries. Skull: Negative for fracture or focal lesion. Sinuses/Orbits: Mucosal thickening in the anterior ethmoid air cells and inferior frontal sinuses. Air-fluid level in the left sphenoid sinus. No acute finding in the orbits. Status post bilateral lens replacements. Other: Trace fluid in the mastoid air cells. IMPRESSION: No acute intracranial process. Electronically Signed   By: Wiliam Ke M.D.   On: 11/12/2023 14:30   DG Chest Port 1 View Result Date: 11/12/2023 CLINICAL DATA:  Altered mental status and generalized weakness. EXAM: PORTABLE CHEST 1 VIEW COMPARISON:  Chest radiograph dated 11/08/2023. FINDINGS: No focal consolidation, pleural effusion, or pneumothorax. The cardiac silhouette is within normal limits. The aorta  is tortuous. No acute osseous pathology. IMPRESSION: No active disease. Electronically Signed   By: Elgie Collard M.D.   On: 11/12/2023 11:09    Scheduled Meds:  [START ON 11/14/2023] cosyntropin  0.25 mg Intravenous Once   enoxaparin (LOVENOX) injection  40 mg Subcutaneous Q24H   hydrocortisone  5 mg Oral BID   levothyroxine  62.5 mcg Oral Q0600   pantoprazole (PROTONIX) IV  40 mg Intravenous Q12H   pravastatin  40 mg Oral Daily   Continuous Infusions:  sodium chloride 75 mL/hr at 11/13/23 1002   potassium chloride 10 mEq (11/13/23 1231)     LOS: 0 days   Burnadette Pop, MD [facility] P1/29/2025, 1:23 PM

## 2023-11-14 ENCOUNTER — Encounter (HOSPITAL_COMMUNITY)
Admission: EM | Disposition: A | Discharge status: Home / Self Care | Payer: Self-pay | Source: Home / Self Care | Attending: Internal Medicine

## 2023-11-14 ENCOUNTER — Inpatient Hospital Stay (HOSPITAL_COMMUNITY): Payer: Medicare HMO | Admitting: Anesthesiology

## 2023-11-14 ENCOUNTER — Encounter (HOSPITAL_COMMUNITY): Payer: Self-pay | Admitting: Internal Medicine

## 2023-11-14 DIAGNOSIS — R112 Nausea with vomiting, unspecified: Secondary | ICD-10-CM | POA: Diagnosis not present

## 2023-11-14 DIAGNOSIS — I4891 Unspecified atrial fibrillation: Secondary | ICD-10-CM | POA: Diagnosis not present

## 2023-11-14 DIAGNOSIS — E039 Hypothyroidism, unspecified: Secondary | ICD-10-CM

## 2023-11-14 DIAGNOSIS — K219 Gastro-esophageal reflux disease without esophagitis: Secondary | ICD-10-CM | POA: Diagnosis not present

## 2023-11-14 DIAGNOSIS — I10 Essential (primary) hypertension: Secondary | ICD-10-CM | POA: Diagnosis not present

## 2023-11-14 DIAGNOSIS — R4182 Altered mental status, unspecified: Secondary | ICD-10-CM | POA: Diagnosis not present

## 2023-11-14 HISTORY — PX: ESOPHAGOGASTRODUODENOSCOPY (EGD) WITH PROPOFOL: SHX5813

## 2023-11-14 LAB — GASTROINTESTINAL PANEL BY PCR, STOOL (REPLACES STOOL CULTURE)

## 2023-11-14 LAB — BASIC METABOLIC PANEL
Anion gap: 12 (ref 5–15)
BUN: 5 mg/dL — ABNORMAL LOW (ref 8–23)
CO2: 24 mmol/L (ref 22–32)
Calcium: 8.5 mg/dL — ABNORMAL LOW (ref 8.9–10.3)
Chloride: 95 mmol/L — ABNORMAL LOW (ref 98–111)
Creatinine, Ser: 0.61 mg/dL (ref 0.44–1.00)
GFR, Estimated: >60 mL/min (ref >60)
Glucose, Bld: 105 mg/dL — ABNORMAL HIGH (ref 70–99)
Potassium: 3.2 mmol/L — ABNORMAL LOW (ref 3.5–5.1)
Sodium: 131 mmol/L — ABNORMAL LOW (ref 135–145)

## 2023-11-14 LAB — ACTH STIMULATION, 3 TIME POINTS
Cortisol, 30 Min: 34.6 ug/dL
Cortisol, 60 Min: 46.5 ug/dL
Cortisol, Base: 34.2 ug/dL

## 2023-11-14 SURGERY — ESOPHAGOGASTRODUODENOSCOPY (EGD) WITH PROPOFOL
Anesthesia: Monitor Anesthesia Care

## 2023-11-14 MED ORDER — PANTOPRAZOLE SODIUM 40 MG PO TBEC
40.0000 mg | DELAYED_RELEASE_TABLET | Freq: Every day | ORAL | Status: DC
  Administered 2023-11-15 – 2023-11-17 (×3): 40 mg via ORAL
  Filled 2023-11-14 (×3): qty 1

## 2023-11-14 MED ORDER — GERHARDT'S BUTT CREAM
TOPICAL_CREAM | Freq: Four times a day (QID) | CUTANEOUS | Status: DC
  Administered 2023-11-15: 1 via TOPICAL
  Filled 2023-11-14 (×2): qty 60

## 2023-11-14 MED ORDER — LACTATED RINGERS IV SOLN: INTRAVENOUS | Status: DC | PRN

## 2023-11-14 MED ORDER — POTASSIUM CHLORIDE CRYS ER 20 MEQ PO TBCR
40.0000 meq | EXTENDED_RELEASE_TABLET | ORAL | Status: AC
Start: 2023-11-14 — End: 2023-11-14
  Administered 2023-11-14 (×2): 40 meq via ORAL
  Filled 2023-11-14 (×2): qty 2

## 2023-11-14 MED ORDER — PROPOFOL 10 MG/ML IV BOLUS
INTRAVENOUS | Status: DC | PRN
  Administered 2023-11-14: 20 mg via INTRAVENOUS
  Administered 2023-11-14: 30 mg via INTRAVENOUS
  Administered 2023-11-14: 20 mg via INTRAVENOUS

## 2023-11-14 MED ORDER — LOPERAMIDE HCL 2 MG PO CAPS
2.0000 mg | ORAL_CAPSULE | Freq: Four times a day (QID) | ORAL | Status: DC | PRN
  Administered 2023-11-14: 2 mg via ORAL
  Filled 2023-11-14: qty 1

## 2023-11-14 MED ORDER — LIDOCAINE 2% (20 MG/ML) 5 ML SYRINGE
INTRAMUSCULAR | Status: DC | PRN
  Administered 2023-11-14: 100 mg via INTRAVENOUS

## 2023-11-14 SURGICAL SUPPLY — 14 items

## 2023-11-14 NOTE — Transfer of Care (Signed)
Immediate Anesthesia Transfer of Care Note  Patient: Charlene Thomas  Procedure(s) Performed: ESOPHAGOGASTRODUODENOSCOPY (EGD) WITH PROPOFOL  Patient Location: PACU  Anesthesia Type:MAC  Level of Consciousness: awake, alert , and oriented  Airway & Oxygen Therapy: Patient Spontanous Breathing and Patient connected to nasal cannula oxygen  Post-op Assessment: Report given to RN and Post -op Vital signs reviewed and stable  Post vital signs: Reviewed and stable  Last Vitals:  Vitals Value Taken Time  BP 138/88 11/14/23 1009  Temp 98 11/14/23 1009  Pulse 89 11/14/23 1009  Resp 14 11/14/23 1009  SpO2 96 11/14/23 1009   Pt denies pain   Complications: No notable events documented.

## 2023-11-14 NOTE — Progress Notes (Signed)
Pt's son let nursing staff know that they no longer have a safe discharge plan in place for patient. Pt's daughter that lives with her had an emergency and is now out of state for multiple days. Pt's son is requesting to talk with case management about options, case management is out for the day but was sent a message to make them aware.

## 2023-11-14 NOTE — Anesthesia Postprocedure Evaluation (Signed)
Anesthesia Post Note  Patient: Charlene Thomas  Procedure(s) Performed: ESOPHAGOGASTRODUODENOSCOPY (EGD) WITH PROPOFOL     Patient location during evaluation: Nursing Unit Anesthesia Type: MAC Level of consciousness: awake and alert Pain management: pain level controlled Vital Signs Assessment: post-procedure vital signs reviewed and stable Respiratory status: spontaneous breathing, nonlabored ventilation, respiratory function stable and patient connected to nasal cannula oxygen Cardiovascular status: stable and blood pressure returned to baseline Postop Assessment: no apparent nausea or vomiting Anesthetic complications: no   There were no known notable events for this encounter.  Last Vitals:  Vitals:   11/14/23 1351 11/14/23 1627  BP: (!) 129/91 (!) 140/82  Pulse: 99 88  Resp: 15 17  Temp:  36.8 C  SpO2: 96% 99%    Last Pain:  Vitals:   11/14/23 1627  TempSrc: Oral  PainSc:                  Collene Schlichter

## 2023-11-14 NOTE — Anesthesia Preprocedure Evaluation (Addendum)
Anesthesia Evaluation  Patient identified by MRN, date of birth, ID band Patient awake    Reviewed: Allergy & Precautions, NPO status , Patient's Chart, lab work & pertinent test results, reviewed documented beta blocker date and time   Airway Mallampati: II  TM Distance: >3 FB Neck ROM: Full    Dental  (+) Teeth Intact, Dental Advisory Given Permanent bridges :   Pulmonary former smoker   Pulmonary exam normal breath sounds clear to auscultation       Cardiovascular hypertension, Pt. on medications and Pt. on home beta blockers + dysrhythmias Atrial Fibrillation  Rhythm:Irregular Rate:Abnormal     Neuro/Psych  PSYCHIATRIC DISORDERS     Dementia negative neurological ROS     GI/Hepatic Neg liver ROS,GERD  Medicated,,Nausea and vomiting   Endo/Other  Hypothyroidism    Renal/GU negative Renal ROS     Musculoskeletal  (+) Arthritis ,    Abdominal   Peds  Hematology  (+) Blood dyscrasia, anemia   Anesthesia Other Findings Day of surgery medications reviewed with the patient.  Reproductive/Obstetrics                              Anesthesia Physical Anesthesia Plan  ASA: 3  Anesthesia Plan: MAC   Post-op Pain Management: Minimal or no pain anticipated   Induction: Intravenous  PONV Risk Score and Plan: 2 and TIVA and Treatment may vary due to age or medical condition  Airway Management Planned: Simple Face Mask and Natural Airway  Additional Equipment:   Intra-op Plan:   Post-operative Plan:   Informed Consent: I have reviewed the patients History and Physical, chart, labs and discussed the procedure including the risks, benefits and alternatives for the proposed anesthesia with the patient or authorized representative who has indicated his/her understanding and acceptance.     Dental advisory given and Consent reviewed with POA  Plan Discussed with: CRNA  Anesthesia Plan  Comments: (Son at bedside for consent )        Anesthesia Quick Evaluation

## 2023-11-14 NOTE — Progress Notes (Signed)
PROGRESS NOTE  Charlene Thomas  ZOX:[id number] DOB: 1946-03-03 DOA: 11/12/2023 PCP: Marguarite Arbour, MD   Brief Narrative: Charlene Thomas is a 78 y.o. female with medical history significant of dementia, hypertension, palpitations, hypothyroidism, GERD, arthritis who presented here from home with complaint of confusion, nausea, vomiting.  Patient was admitted with same symptoms on 1/24 and was discharged on 1/25. She was doing okay after discharge but she started having several episode of vomiting, became confused .  Vomiting was projectile.  After admission, she also started having loose stools.  GI pathogen panel sent.  GI consulted,S/P EGD with normal findings.  Noro virus came out to be  positive.  Plan to advance the diet slowly with possible discharge home tomorrow if she tolerates solid diet  Assessment & Plan:  Principal Problem:   AMS (altered mental status) Active Problems:   Hyponatremia   Dementia with behavioral disturbance (HCC)   Hypertension   Hypothyroidism   GERD (gastroesophageal reflux disease)   Dyslipidemia   Low serum cortisol level   Nausea & vomiting  Altered mental status on the background of dementia: Unclear etiology.  Patient was admitted for the same last time.  Patient has dementia and is confused at baseline but she was thought to be more confused, less verbal, also had intermittent weakness on the left side..  CT head showed no acute findings.  Normal ammonia level,.  MRI of the brain did not show any acute findings. TSH checked on 1/24 was within normal limit. Continue frequent orientation, delirium precautions. Patient takes desvenlafaxine, modafinil, baclofen as needed.  Will hold this medicines for now. Patient lives with her daughter, ambulatory with cane at home.  She is confused at baseline.  She is close to her baseline now   Nausea/vomiting/diarrhea/Noro virus: Was admitted with this problem on last admission.  No fever or leukocytosis.   Currently not on antibiotics.  No report of abdominal pain . Abdomen was benign on examination.  CT abdomen/pelvis did not show any acute findings.  GI consulted.  Started on Protonix.  GI consulted,S/P EGD with normal findings.  Noro virus came out to be  positive.  Her symptoms are mostly from the virus.  Started on clear liquid diet.  Gradually advance the diet as tolerated.  Low serum cortisol level: During last admission, a.m. cortisol level was checked and it was 4.5.Briefly started  Cortef, but now held, pending ACTH stimulation test.     Hyponatremia: Mild.  Continue to monitor.  Potassium supplemented   History of hypertension: Takes metoprolol, Cardizem, losartan at home.  Currently blood pressure stable.  Continue to hold this medicine for now.   Hypothyroidism: Continue Synthyroid   Hyperlipidemia: On pravastatin   GERD: Continue PPI   Debility/deconditioning: Patient lives with daughter.  Consulted PT/OT, recommended home with home health on discharge         DVT prophylaxis:enoxaparin (LOVENOX) injection 40 mg Start: 11/12/23 1530     Code Status: Full Code  Family Communication: Daughter at bedside on 1/29.  Son at bedside at 1/30  Patient status:obs  Patient is from :home  Anticipated discharge WJ:XBJY   Estimated DC date: 1-2 days   Consultants: GI  Procedures: Plan for EGD  Antimicrobials:  Anti-infectives (From admission, onward)    None       Subjective: Patient seen and examined at bedside today.  She just came from EGD.  She looks comfortable.  Denies any abdominal pain.  Alert and awake, oriented  to place.  On baseline mentation.  Not in acute distress.  Son at bedside  Objective: Vitals:   11/14/23 1010 11/14/23 1012 11/14/23 1020 11/14/23 1031  BP: 138/88 138/88 (!) 129/101 (!) 120/100  Pulse: 95 90 86 87  Resp: 16 16 13 14   Temp:      TempSrc:      SpO2: 94% 97% 94% 96%  Weight:      Height:        Intake/Output Summary (Last  24 hours) at 11/14/2023 1127 Last data filed at 11/14/2023 1009 Gross per 24 hour  Intake 2003.73 ml  Output --  Net 2003.73 ml   Filed Weights   11/12/23 0946  Weight: 58.9 kg    Examination:   General exam: Overall comfortable, not in distress, deconditioned, lying in bed HEENT: PERRL Respiratory system:  no wheezes or crackles  Cardiovascular system: S1 & S2 heard, RRR.  Gastrointestinal system: Abdomen is nondistended, soft and nontender. Central nervous system: Alert and awake, oriented to place only Extremities: No edema, no clubbing ,no cyanosis Skin: No rashes, no ulcers,no icterus      Data Reviewed: I have personally reviewed following labs and imaging studies  CBC: Recent Labs  Lab 11/08/23 1900 11/08/23 1910 11/08/23 2159 11/09/23 0402 11/12/23 1002  WBC 8.0  --  7.5 6.4 8.2  NEUTROABS 6.5  --   --   --  7.5  HGB 11.7* 12.9 11.2* 10.7* 11.2*  HCT 35.5* 38.0 34.6* 33.1* 33.8*  MCV 81.1  --  81.6 81.5 82.0  PLT 321  --  261 284 243   Basic Metabolic Panel: Recent Labs  Lab 11/08/23 2159 11/09/23 0402 11/12/23 1002 11/13/23 0628 11/13/23 0629 11/14/23 0733  NA 126* 130* 131*  --  132* 131*  K 4.2 4.1 3.6  --  3.1* 3.2*  CL 97* 98 94*  --  98 95*  CO2 20* 24 23  --  23 24  GLUCOSE 102* 97 108*  --  99 105*  BUN 6* <5* 7*  --  7* 5*  CREATININE 0.75 0.81 0.79  --  0.73 0.61  CALCIUM 9.0 8.9 8.9  --  8.3* 8.5*  MG  --   --  1.6* 2.2  --   --      Recent Results (from the past 240 hours)  Resp panel by RT-PCR (RSV, Flu A&B, Covid) Anterior Nasal Swab     Status: None   Collection Time: 11/08/23  7:58 PM   Specimen: Anterior Nasal Swab  Result Value Ref Range Status   SARS Coronavirus 2 by RT PCR NEGATIVE NEGATIVE Final   Influenza A by PCR NEGATIVE NEGATIVE Final   Influenza B by PCR NEGATIVE NEGATIVE Final    Comment: (NOTE) The Xpert Xpress SARS-CoV-2/FLU/RSV plus assay is intended as an aid in the diagnosis of influenza from  Nasopharyngeal swab specimens and should not be used as a sole basis for treatment. Nasal washings and aspirates are unacceptable for Xpert Xpress SARS-CoV-2/FLU/RSV testing.  Fact Sheet for Patients: BloggerCourse.com  Fact Sheet for Healthcare Providers: SeriousBroker.it  This test is not yet approved or cleared by the [place] FDA and has been authorized for detection and/or diagnosis of SARS-CoV-2 by FDA under an Emergency Use Authorization (EUA). This EUA will remain in effect (meaning this test can be used) for the duration of the COVID-19 declaration under Section 564(b)(1) of the Act, 21 U.S.C. section 360bbb-3(b)(1), unless the authorization is terminated or revoked.  Resp Syncytial Virus by PCR NEGATIVE NEGATIVE Final    Comment: (NOTE) Fact Sheet for Patients: BloggerCourse.com  Fact Sheet for Healthcare Providers: SeriousBroker.it  This test is not yet approved or cleared by the [place] FDA and has been authorized for detection and/or diagnosis of SARS-CoV-2 by FDA under an Emergency Use Authorization (EUA). This EUA will remain in effect (meaning this test can be used) for the duration of the COVID-19 declaration under Section 564(b)(1) of the Act, 21 U.S.C. section 360bbb-3(b)(1), unless the authorization is terminated or revoked.  Performed at [facility], [address] [place], [place] [address]   Resp panel by RT-PCR (RSV, Flu A&B, Covid) Anterior Nasal Swab     Status: None   Collection Time: 11/12/23  8:45 PM   Specimen: Anterior Nasal Swab  Result Value Ref Range Status   SARS Coronavirus 2 by RT PCR NEGATIVE NEGATIVE Final   Influenza A by PCR NEGATIVE NEGATIVE Final   Influenza B by PCR NEGATIVE NEGATIVE Final    Comment: (NOTE) The Xpert Xpress SARS-CoV-2/FLU/RSV plus assay is intended as an aid in the diagnosis of influenza  from Nasopharyngeal swab specimens and should not be used as a sole basis for treatment. Nasal washings and aspirates are unacceptable for Xpert Xpress SARS-CoV-2/FLU/RSV testing.  Fact Sheet for Patients: BloggerCourse.com  Fact Sheet for Healthcare Providers: SeriousBroker.it  This test is not yet approved or cleared by the [place] FDA and has been authorized for detection and/or diagnosis of SARS-CoV-2 by FDA under an Emergency Use Authorization (EUA). This EUA will remain in effect (meaning this test can be used) for the duration of the COVID-19 declaration under Section 564(b)(1) of the Act, 21 U.S.C. section 360bbb-3(b)(1), unless the authorization is terminated or revoked.     Resp Syncytial Virus by PCR NEGATIVE NEGATIVE Final    Comment: (NOTE) Fact Sheet for Patients: BloggerCourse.com  Fact Sheet for Healthcare Providers: SeriousBroker.it  This test is not yet approved or cleared by the [place] FDA and has been authorized for detection and/or diagnosis of SARS-CoV-2 by FDA under an Emergency Use Authorization (EUA). This EUA will remain in effect (meaning this test can be used) for the duration of the COVID-19 declaration under Section 564(b)(1) of the Act, 21 U.S.C. section 360bbb-3(b)(1), unless the authorization is terminated or revoked.  Performed at [facility], [address] [place], [place] [address]   Gastrointestinal Panel by PCR , Stool     Status: Abnormal   Collection Time: 11/13/23  8:59 AM   Specimen: Stool  Result Value Ref Range Status   Campylobacter species NOT DETECTED NOT DETECTED Final   Plesimonas shigelloides NOT DETECTED NOT DETECTED Final   Salmonella species NOT DETECTED NOT DETECTED Final   Yersinia enterocolitica NOT DETECTED NOT DETECTED Final   Vibrio species NOT DETECTED NOT DETECTED Final   Vibrio cholerae NOT  DETECTED NOT DETECTED Final   Enteroaggregative E coli (EAEC) NOT DETECTED NOT DETECTED Final   Enteropathogenic E coli (EPEC) NOT DETECTED NOT DETECTED Final   Enterotoxigenic E coli (ETEC) NOT DETECTED NOT DETECTED Final   Shiga like toxin producing E coli (STEC) NOT DETECTED NOT DETECTED Final   Shigella/Enteroinvasive E coli (EIEC) NOT DETECTED NOT DETECTED Final   Cryptosporidium NOT DETECTED NOT DETECTED Final   Cyclospora cayetanensis NOT DETECTED NOT DETECTED Final   Entamoeba histolytica NOT DETECTED NOT DETECTED Final   Giardia lamblia NOT DETECTED NOT DETECTED Final   Adenovirus F40/41 NOT  DETECTED NOT DETECTED Final   Astrovirus NOT DETECTED NOT DETECTED Final   Norovirus GI/GII DETECTED (A) NOT DETECTED Final    Comment: RESULT CALLED TO, READ BACK BY AND VERIFIED WITH:  [facility] ONEIL AT 0032 11/14/23 JG    Rotavirus A NOT DETECTED NOT DETECTED Final   Sapovirus (I, II, IV, and V) NOT DETECTED NOT DETECTED Final    Comment: Performed at [facility], [address] [place], [place] [address]     Radiology Studies: CT ABDOMEN PELVIS W CONTRAST Result Date: 11/12/2023 CLINICAL DATA:  Bowel obstruction suspected Previously seen for low sodium levels and AMS recently. Taking prescribed med for sodium levels at home EXAM: CT ABDOMEN AND PELVIS WITH CONTRAST TECHNIQUE: Multidetector CT imaging of the abdomen and pelvis was performed using the standard protocol following bolus administration of intravenous contrast. RADIATION DOSE REDUCTION: This exam was performed according to the departmental dose-optimization program which includes automated exposure control, adjustment of the mA and/or kV according to patient size and/or use of iterative reconstruction technique. CONTRAST:  60mL OMNIPAQUE IOHEXOL 350 MG/ML SOLN COMPARISON:  CT abdomen pelvis 01/26/2023, CT renal 04/18/2021 FINDINGS: Lower chest: No acute abnormality. Hepatobiliary: Subcentimeter hypodensities too small to  characterize. Status post cholecystectomy. No biliary dilatation. Pancreas: No focal lesion. Normal pancreatic contour. No surrounding inflammatory changes. No main pancreatic ductal dilatation. Spleen: Normal in size without focal abnormality. Adrenals/Urinary Tract: No adrenal nodule bilaterally. Bilateral kidneys enhance symmetrically. No hydronephrosis. No hydroureter. The urinary bladder is unremarkable. On delayed imaging, there is no urothelial wall thickening and there are no filling defects in the opacified portions of the bilateral collecting systems or ureters. Stomach/Bowel: Stomach is within normal limits. No evidence of bowel wall thickening or dilatation. Colonic diverticulosis. The appendix is not definitely identified with no inflammatory changes in the right lower quadrant to suggest acute appendicitis. Vascular/Lymphatic: No abdominal aorta or iliac aneurysm. Severe atherosclerotic plaque of the aorta and its branches. No abdominal, pelvic, or inguinal lymphadenopathy. Reproductive: Status post hysterectomy. No adnexal masses. Other: No intraperitoneal free fluid. No intraperitoneal free gas. No organized fluid collection. Musculoskeletal: No abdominal wall hernia or abnormality. Diffusely decreased bone density. No suspicious lytic or blastic osseous lesions. No acute displaced fracture. Grade 1 anterolisthesis of L5 on S1. Multilevel degenerative changes spine. Screw fixation of the left femoral neck. IMPRESSION: 1. No acute intra-abdominal or intrapelvic abnormality.Colonic diverticulosis with no acute diverticulitis. 2.  Aortic Atherosclerosis (ICD10-I70.0). Electronically Signed   By: Tish Frederickson M.D.   On: 11/12/2023 20:14   MR BRAIN WO CONTRAST Result Date: 11/12/2023 CLINICAL DATA:  Encephalopathy EXAM: MRI HEAD WITHOUT CONTRAST TECHNIQUE: Multiplanar, multiecho pulse sequences of the brain and surrounding structures were obtained without intravenous contrast. COMPARISON:   11/12/2023 CT head, 07/29/2023 MRI head FINDINGS: Brain: No restricted diffusion to suggest acute or subacute infarct. No acute hemorrhage, mass, mass effect, or midline shift. No hydrocephalus or extra-axial collection. Pituitary and craniocervical junction within normal limits. No hemosiderin deposition to suggest remote hemorrhage. Age related cerebral atrophy. Ex vacuo dilatation of the ventricles. Confluent T2 hyperintense signal in the periventricular white matter, likely the sequela of moderate to severe chronic small vessel ischemic disease. 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 Vascular: Normal arterial flow voids. Skull and upper cervical spine: Normal marrow signal. Sinuses/Orbits: Mucosal thickening in the ethmoid air cells. Air-fluid level in the left sphenoid sinus. Status post bilateral lens replacements. No acute finding in the orbits. Other: Trace fluid in the bilateral mastoid  air cells. IMPRESSION: 1. No acute intracranial process. No evidence of acute or subacute infarct. 2. Air-fluid level in the left sphenoid sinus, which can be seen in the setting of acute sinusitis. Electronically Signed   By: Wiliam Ke M.D.   On: 11/12/2023 18:16   CT Head Wo Contrast Result Date: 11/12/2023 CLINICAL DATA:  Mental status change EXAM: CT HEAD WITHOUT CONTRAST TECHNIQUE: Contiguous axial images were obtained from the base of the skull through the vertex without intravenous contrast. RADIATION DOSE REDUCTION: This exam was performed according to the departmental dose-optimization program which includes automated exposure control, adjustment of the mA and/or kV according to patient size and/or use of iterative reconstruction technique. COMPARISON:  11/08/2023 FINDINGS: Brain: No evidence of acute infarction, hemorrhage, mass, mass effect, or midline shift. No hydrocephalus or extra-axial fluid collection. Age related cerebral atrophy, Periventricular white matter changes, likely the sequela  of chronic small vessel ischemic disease. 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 Vascular: No hyperdense vessel. Atherosclerotic calcifications in the intracranial carotid and vertebral arteries. Skull: Negative for fracture or focal lesion. Sinuses/Orbits: Mucosal thickening in the anterior ethmoid air cells and inferior frontal sinuses. Air-fluid level in the left sphenoid sinus. No acute finding in the orbits. Status post bilateral lens replacements. Other: Trace fluid in the mastoid air cells. IMPRESSION: No acute intracranial process. Electronically Signed   By: Wiliam Ke M.D.   On: 11/12/2023 14:30    Scheduled Meds:  cosyntropin  0.25 mg Intravenous Once   enoxaparin (LOVENOX) injection  40 mg Subcutaneous Q24H   levothyroxine  62.5 mcg Oral Q0600   pantoprazole (PROTONIX) IV  40 mg Intravenous Q12H   pravastatin  40 mg Oral Daily   Continuous Infusions:     LOS: 1 day   Burnadette Pop, MD [facility] P1/30/2025, 11:27 AM

## 2023-11-14 NOTE — Interval H&P Note (Signed)
History and Physical Interval Note:  11/14/2023 9:30 AM  Charlene Thomas  has presented today for surgery, with the diagnosis of Nausea and vomiting.  The various methods of treatment have been discussed with the patient and family. After consideration of risks, benefits and other options for treatment, the patient has consented to  Procedure(s): ESOPHAGOGASTRODUODENOSCOPY (EGD) WITH PROPOFOL (N/A) as a surgical intervention.  The patient's history has been reviewed, patient examined, no change in status, stable for surgery.  I have reviewed the patient's chart and labs.  Questions were answered to the patient's satisfaction.      Charlie Pitter III

## 2023-11-14 NOTE — Op Note (Signed)
[facility] Patient Name: Charlene Thomas Procedure Date : 11/14/2023 MRN: [id number] Attending MD: Starr Lake. Myrtie Neither , MD, [id number] Date of Birth: 1945-11-20 CSN: [id number] Age: 78 Admit Type: Inpatient Procedure:                Upper GI endoscopy Indications:              Nausea with vomiting                           see inpatient consult note for details Providers:                Sherilyn Cooter L. Myrtie Neither, MD, Suzy Bouchard, RN, Rozetta Nunnery, Technician Referring MD:             [facility] Medicines:                Monitored Anesthesia Care Complications:            No immediate complications. Estimated Blood Loss:     Estimated blood loss: none. Procedure:                Pre-Anesthesia Assessment:                           - Prior to the procedure, a History and Physical                            was performed, and patient medications and                            allergies were reviewed. The patient's tolerance of                            previous anesthesia was also reviewed. The risks                            and benefits of the procedure and the sedation                            options and risks were discussed with the patient.                            All questions were answered, and informed consent                            was obtained. Prior Anticoagulants: The patient has                            taken no anticoagulant or antiplatelet agents. ASA                            Grade Assessment: III - A patient with severe  systemic disease. After reviewing the risks and                            benefits, the patient was deemed in satisfactory                            condition to undergo the procedure.                           After obtaining informed consent, the endoscope was                            passed under direct vision. Throughout the                            procedure, the  patient's blood pressure, pulse, and                            oxygen saturations were monitored continuously. The                            [id number]-[id number] ([id number]) Olympus endoscope was introduced                            through the mouth, and advanced to the second part                            of duodenum. The upper GI endoscopy was                            accomplished without difficulty. The patient                            tolerated the procedure well. Scope In: Scope Out: Findings:      The esophagus was normal.      The stomach was normal.      The cardia and gastric fundus were normal on retroflexion.      The examined duodenum was normal. Impression:               - Normal esophagus.                           - Normal stomach.                           - Normal examined duodenum.                           - No specimens collected. Recommendation:           - Patient has a contact number available for                            emergencies. The signs and symptoms of potential  delayed complications were discussed with the                            patient. Return to normal activities tomorrow.                            Written discharge instructions were provided to the                            patient.                           - Clear liquid diet.                           - Advance diet as tolerated.                           - Return patient to hospital ward for ongoing care.                           - As-needed antiemetics                           Symptoms most likely from recent gastroenteritis                            (family reports other members with similar recent                            and current symptoms)                           Result discussed with family                           Inpatient GI service signing off - call as needed.                            Post-infectious symptoms can last for weeks                             afterwards but are expected to slowly abate over                            that time. Procedure Code(s):        --- Professional ---                           [phone], Esophagogastroduodenoscopy, flexible,                            transoral; diagnostic, including collection of                            specimen(s) by brushing or washing, when performed                            (  separate procedure) Diagnosis Code(s):        --- Professional ---                           R11.2, Nausea with vomiting, unspecified CPT copyright 2022 [employer]. All rights reserved. The codes documented in this report are preliminary and upon coder review may  be revised to meet current compliance requirements. [name] L. Myrtie Neither, MD 11/14/2023 10:10:43 AM This report has been signed electronically. Number of Addenda: 0

## 2023-11-14 NOTE — Progress Notes (Signed)
Physical Therapy Treatment Patient Details Name: Charlene Thomas MRN: [id number] DOB: 05-30-1946 Today's Date: 11/14/2023   History of Present Illness 78 y.o. female who presented here from home with complaint of confusion, nausea, vomiting, diarrhea. PMH significant of dementia, hypertension, palpitations, hypothyroidism, GERD, arthritis    PT Comments  Patient resting in bed and agreeable to therapy but reporting recent uncontrolled BM and needing assist for pericare. Pt able to roll with Min assist and Total care provided for pericare. Noted pt's bottom to be red and raw, pt reports sore and RN notified and asked if pt can have Gerhardt's Butt cream ordered. Pt completed supine>sit with Min assist, experienced bout of emesis and diahrea at EOB, Pt was able to stand with Min assist and pericare provided again, Mod assist to guide walker for step pivot transfer bed>chair. EOS pt remained in recliner, Alarm on and call bell within reach and lab tech present in room. Will continue to progress pt as able during acute stay.    If plan is discharge home, recommend the following: A lot of help with walking and/or transfers;A lot of help with bathing/dressing/bathroom;Direct supervision/assist for medications management;Direct supervision/assist for financial management;Help with stairs or ramp for entrance;Supervision due to cognitive status   Can travel by private vehicle        Equipment Recommendations  Wheelchair (measurements PT);Wheelchair cushion (measurements PT)    Recommendations for Other Services       Precautions / Restrictions Precautions Precautions: Fall Restrictions Weight Bearing Restrictions Per Provider Order: No     Mobility  Bed Mobility Overal bed mobility: Needs Assistance Bed Mobility: Supine to Sit, Rolling Rolling: Min assist, Used rails   Supine to sit: HOB elevated, Min assist, Used rails     General bed mobility comments: Min assist for rolling for  pericare, min assist with cues for use of bed rail to raise trunk upright. pt with slight posterior lean in sitting and CGA provided for safety.     Transfers Overall transfer level: Needs assistance Equipment used: None Transfers: Sit to/from Stand, Bed to chair/wheelchair/BSC Sit to Stand: Min assist   Step pivot transfers: Mod assist       General transfer comment: RW provided and min assist for power up and to steady stand. Mod assist to guide weight shift for lateral steps bed>chair.    Ambulation/Gait               General Gait Details: deferred due to incontinence and emesis   Stairs             Wheelchair Mobility     Tilt Bed    Modified Rankin (Stroke Patients Only)       Balance Overall balance assessment: Needs assistance Sitting-balance support: Bilateral upper extremity supported, Feet unsupported Sitting balance-Leahy Scale: Poor Sitting balance - Comments: initial rt, posterior lean   Standing balance support: Bilateral upper extremity supported, During functional activity Standing balance-Leahy Scale: Poor                              Cognition Arousal: Alert Behavior During Therapy: Flat affect Overall Cognitive Status: History of cognitive impairments - at baseline                                 General Comments: daughter present        Exercises General Exercises -  Lower Extremity Heel Slides: AROM, Both, 5 reps, Supine (prior to OOB)    General Comments        Pertinent Vitals/Pain      Home Living                          Prior Function            PT Goals (current goals can now be found in the care plan section) Acute Rehab PT Goals Patient Stated Goal: none stated; daughter for pt to get stronger and return home PT Goal Formulation: With patient/family Time For Goal Achievement: 11/27/23 Potential to Achieve Goals: Good Progress towards PT goals: Progressing toward  goals    Frequency    Min 1X/week      PT Plan      Co-evaluation              AM-PAC PT "6 Clicks" Mobility   Outcome Measure  Help needed turning from your back to your side while in a flat bed without using bedrails?: A Lot Help needed moving from lying on your back to sitting on the side of a flat bed without using bedrails?: A Lot Help needed moving to and from a bed to a chair (including a wheelchair)?: A Lot Help needed standing up from a chair using your arms (e.g., wheelchair or bedside chair)?: A Lot Help needed to walk in hospital room?: Total Help needed climbing 3-5 steps with a railing? : Total 6 Click Score: 10    End of Session   Activity Tolerance: Patient limited by fatigue Patient left: in bed;with call bell/phone within reach;with nursing/sitter in room;with family/visitor present Nurse Communication: Mobility status PT Visit Diagnosis: Unsteadiness on feet (R26.81);Muscle weakness (generalized) (M62.81);Difficulty in walking, not elsewhere classified (R26.2)     Time: 1420-1450 PT Time Calculation (min) (ACUTE ONLY): 30 min  Charges:    $Therapeutic Activity: 23-37 mins PT General Charges $$ ACUTE PT VISIT: 1 Visit                     Wynn Maudlin, DPT Acute Rehabilitation Services Office [phone]  11/14/23 4:17 PM

## 2023-11-14 NOTE — Plan of Care (Signed)
  Problem: Clinical Measurements: Goal: Ability to maintain clinical measurements within normal limits will improve Outcome: Progressing Goal: Respiratory complications will improve Outcome: Progressing   Problem: Activity: Goal: Risk for activity intolerance will decrease Outcome: Progressing   Problem: Pain Managment: Goal: General experience of comfort will improve and/or be controlled Outcome: Progressing   Problem: Skin Integrity: Goal: Risk for impaired skin integrity will decrease Outcome: Progressing

## 2023-11-15 ENCOUNTER — Encounter (HOSPITAL_COMMUNITY): Payer: Self-pay | Admitting: Gastroenterology

## 2023-11-15 DIAGNOSIS — R4182 Altered mental status, unspecified: Secondary | ICD-10-CM | POA: Diagnosis not present

## 2023-11-15 LAB — BASIC METABOLIC PANEL WITH GFR
Anion gap: 11 (ref 5–15)
BUN: 9 mg/dL (ref 8–23)
CO2: 23 mmol/L (ref 22–32)
Calcium: 8.8 mg/dL — ABNORMAL LOW (ref 8.9–10.3)
Chloride: 96 mmol/L — ABNORMAL LOW (ref 98–111)
Creatinine, Ser: 0.68 mg/dL (ref 0.44–1.00)
GFR, Estimated: >60 mL/min
Glucose, Bld: 89 mg/dL (ref 70–99)
Potassium: 3.6 mmol/L (ref 3.5–5.1)
Sodium: 130 mmol/L — ABNORMAL LOW (ref 135–145)

## 2023-11-15 MED ORDER — SODIUM CHLORIDE 0.9 % IV SOLN: INTRAVENOUS | Status: AC

## 2023-11-15 MED ORDER — METOPROLOL TARTRATE 25 MG PO TABS
25.0000 mg | ORAL_TABLET | Freq: Two times a day (BID) | ORAL | Status: DC
  Administered 2023-11-15 – 2023-11-17 (×5): 25 mg via ORAL
  Filled 2023-11-15 (×5): qty 1

## 2023-11-15 MED ORDER — POTASSIUM CHLORIDE CRYS ER 20 MEQ PO TBCR
40.0000 meq | EXTENDED_RELEASE_TABLET | Freq: Once | ORAL | Status: AC
  Administered 2023-11-15: 40 meq via ORAL
  Filled 2023-11-15: qty 2

## 2023-11-15 NOTE — Progress Notes (Signed)
PROGRESS NOTE  AINARA Thomas  ZOX:[id number] DOB: 1946-04-07 DOA: 11/12/2023 PCP: Marguarite Arbour, MD   Brief Narrative: Charlene Thomas is a 78 y.o. female with medical history significant of dementia, hypertension, palpitations, hypothyroidism, GERD, arthritis who presented here from home with complaint of confusion, nausea, vomiting.  Patient was admitted with same symptoms on 1/24 and was discharged on 1/25. She was doing okay after discharge but she started having several episode of vomiting, became confused .  Vomiting was projectile.  After admission, she also started having loose stools.  GI pathogen panel sent.  GI consulted,S/P EGD with normal findings.  Noro virus came out to be  positive.  Plan to advance the diet slowly , started on soft diet.  PT has recommended SNF on discharge.  Waiting for bed   Assessment & Plan:  Principal Problem:   AMS (altered mental status) Active Problems:   Hyponatremia   Dementia with behavioral disturbance (HCC)   Hypertension   Hypothyroidism   GERD (gastroesophageal reflux disease)   Dyslipidemia   Low serum cortisol level   Nausea & vomiting  Altered mental status on the background of dementia: Unclear etiology.  Patient was admitted for the same last time.  Patient has dementia and is confused at baseline but she was thought to be more confused, less verbal, also had intermittent weakness on the left side..  CT head showed no acute findings.  Normal ammonia level,.  MRI of the brain did not show any acute findings. TSH checked on 1/24 was within normal limit. Continue frequent orientation, delirium precautions. Patient takes desvenlafaxine, modafinil, baclofen as needed.  Will hold this medicines for now. Patient lives with her daughter, ambulatory with cane at home.  She is confused at baseline.  She is close to her baseline now   Nausea/vomiting/diarrhea/Noro virus: Was admitted with this problem on last admission.  No fever or  leukocytosis.  Currently not on antibiotics.  No report of abdominal pain . Abdomen was benign on examination.  CT abdomen/pelvis did not show any acute findings.  GI consulted.  Started on Protonix.  GI consulted,S/P EGD with normal findings.  Noro virus came out to be  positive.  Her symptoms are mostly from the virus.  Abdomen is benign on examination.  Advanced diet to soft.  Continue antiemetics if needed.  Low serum cortisol level: During last admission, a.m. cortisol level was checked and it was 4.5.Briefly started  Cortef, but now held.  ACTH stimulation test normal.   Hyponatremia: Mild.  Continue to monitor.  Potassium supplemented   History of hypertension: Takes metoprolol, Cardizem, losartan at home.  Blood pressure trended up, will restart metoprolol   Hypothyroidism: Continue Synthyroid   Hyperlipidemia: On pravastatin   GERD: Continue PPI   Debility/deconditioning: Patient lives with daughter.  Consulted PT/OT, recommended SNF on discharge.TOC following         DVT prophylaxis:enoxaparin (LOVENOX) injection 40 mg Start: 11/12/23 1530     Code Status: Full Code  Family Communication: Daughter at bedside on 1/29.  Son at bedside at 1/30  Patient status:obs  Patient is from :home  Anticipated discharge to:SNF   Estimated DC date: 1-2 days   Consultants: GI  Procedures: Plan for EGD  Antimicrobials:  Anti-infectives (From admission, onward)    None       Subjective: Patient seen and examined at bedside today.  Hemodynamically stable.  She was very comfortable during my evaluation.  Lying in bed.  She denied  any nausea or vomiting or diarrhea during my evaluation.  No abdominal pain.  But I got a report later that she became nauseous and had to be given Zofran.  Objective: Vitals:   11/14/23 2146 11/15/23 0500 11/15/23 0758 11/15/23 1000  BP: (!) 144/85 (!) 145/80 (!) 132/105 (!) 145/98  Pulse: 96 98 89   Resp: 17 17 19    Temp: 98.3 F (36.8 C)  98.2 F (36.8 C) 98.6 F (37 C)   TempSrc: Oral Oral Oral   SpO2: 99% 99% 96%   Weight:      Height:       No intake or output data in the 24 hours ending 11/15/23 1157  Filed Weights   11/12/23 0946  Weight: 58.9 kg    Examination:   General exam: Overall comfortable, not in distress, deconditioned HEENT: PERRL Respiratory system:  no wheezes or crackles  Cardiovascular system: S1 & S2 heard, RRR.  Gastrointestinal system: Abdomen is nondistended, soft and nontender. Central nervous system: Alert and awake, oriented to place only Extremities: No edema, no clubbing ,no cyanosis Skin: No rashes, no ulcers,no icterus        Data Reviewed: I have personally reviewed following labs and imaging studies  CBC: Recent Labs  Lab 11/08/23 1900 11/08/23 1910 11/08/23 2159 11/09/23 0402 11/12/23 1002  WBC 8.0  --  7.5 6.4 8.2  NEUTROABS 6.5  --   --   --  7.5  HGB 11.7* 12.9 11.2* 10.7* 11.2*  HCT 35.5* 38.0 34.6* 33.1* 33.8*  MCV 81.1  --  81.6 81.5 82.0  PLT 321  --  261 284 243   Basic Metabolic Panel: Recent Labs  Lab 11/09/23 0402 11/12/23 1002 11/13/23 0628 11/13/23 0629 11/14/23 0733 11/15/23 0709  NA 130* 131*  --  132* 131* 130*  K 4.1 3.6  --  3.1* 3.2* 3.6  CL 98 94*  --  98 95* 96*  CO2 24 23  --  23 24 23   GLUCOSE 97 108*  --  99 105* 89  BUN <5* 7*  --  7* 5* 9  CREATININE 0.81 0.79  --  0.73 0.61 0.68  CALCIUM 8.9 8.9  --  8.3* 8.5* 8.8*  MG  --  1.6* 2.2  --   --   --      Recent Results (from the past 240 hours)  Resp panel by RT-PCR (RSV, Flu A&B, Covid) Anterior Nasal Swab     Status: None   Collection Time: 11/08/23  7:58 PM   Specimen: Anterior Nasal Swab  Result Value Ref Range Status   SARS Coronavirus 2 by RT PCR NEGATIVE NEGATIVE Final   Influenza A by PCR NEGATIVE NEGATIVE Final   Influenza B by PCR NEGATIVE NEGATIVE Final    Comment: (NOTE) The Xpert Xpress SARS-CoV-2/FLU/RSV plus assay is intended as an aid in the diagnosis  of influenza from Nasopharyngeal swab specimens and should not be used as a sole basis for treatment. Nasal washings and aspirates are unacceptable for Xpert Xpress SARS-CoV-2/FLU/RSV testing.  Fact Sheet for Patients: BloggerCourse.com  Fact Sheet for Healthcare Providers: SeriousBroker.it  This test is not yet approved or cleared by the [place] FDA and has been authorized for detection and/or diagnosis of SARS-CoV-2 by FDA under an Emergency Use Authorization (EUA). This EUA will remain in effect (meaning this test can be used) for the duration of the COVID-19 declaration under Section 564(b)(1) of the Act, 21 U.S.C. section 360bbb-3(b)(1), unless the  authorization is terminated or revoked.     Resp Syncytial Virus by PCR NEGATIVE NEGATIVE Final    Comment: (NOTE) Fact Sheet for Patients: BloggerCourse.com  Fact Sheet for Healthcare Providers: SeriousBroker.it  This test is not yet approved or cleared by the [place] FDA and has been authorized for detection and/or diagnosis of SARS-CoV-2 by FDA under an Emergency Use Authorization (EUA). This EUA will remain in effect (meaning this test can be used) for the duration of the COVID-19 declaration under Section 564(b)(1) of the Act, 21 U.S.C. section 360bbb-3(b)(1), unless the authorization is terminated or revoked.  Performed at [facility], [address], [place], [place] [address]   Resp panel by RT-PCR (RSV, Flu A&B, Covid) Anterior Nasal Swab     Status: None   Collection Time: 11/12/23  8:45 PM   Specimen: Anterior Nasal Swab  Result Value Ref Range Status   SARS Coronavirus 2 by RT PCR NEGATIVE NEGATIVE Final   Influenza A by PCR NEGATIVE NEGATIVE Final   Influenza B by PCR NEGATIVE NEGATIVE Final    Comment: (NOTE) The Xpert Xpress SARS-CoV-2/FLU/RSV plus assay is intended as an aid in the  diagnosis of influenza from Nasopharyngeal swab specimens and should not be used as a sole basis for treatment. Nasal washings and aspirates are unacceptable for Xpert Xpress SARS-CoV-2/FLU/RSV testing.  Fact Sheet for Patients: BloggerCourse.com  Fact Sheet for Healthcare Providers: SeriousBroker.it  This test is not yet approved or cleared by the [place] FDA and has been authorized for detection and/or diagnosis of SARS-CoV-2 by FDA under an Emergency Use Authorization (EUA). This EUA will remain in effect (meaning this test can be used) for the duration of the COVID-19 declaration under Section 564(b)(1) of the Act, 21 U.S.C. section 360bbb-3(b)(1), unless the authorization is terminated or revoked.     Resp Syncytial Virus by PCR NEGATIVE NEGATIVE Final    Comment: (NOTE) Fact Sheet for Patients: BloggerCourse.com  Fact Sheet for Healthcare Providers: SeriousBroker.it  This test is not yet approved or cleared by the [place] FDA and has been authorized for detection and/or diagnosis of SARS-CoV-2 by FDA under an Emergency Use Authorization (EUA). This EUA will remain in effect (meaning this test can be used) for the duration of the COVID-19 declaration under Section 564(b)(1) of the Act, 21 U.S.C. section 360bbb-3(b)(1), unless the authorization is terminated or revoked.  Performed at [facility], [address] [place], [place] [address]   Gastrointestinal Panel by PCR , Stool     Status: Abnormal   Collection Time: 11/13/23  8:59 AM   Specimen: Stool  Result Value Ref Range Status   Campylobacter species NOT DETECTED NOT DETECTED Final   Plesimonas shigelloides NOT DETECTED NOT DETECTED Final   Salmonella species NOT DETECTED NOT DETECTED Final   Yersinia enterocolitica NOT DETECTED NOT DETECTED Final   Vibrio species NOT DETECTED NOT DETECTED Final    Vibrio cholerae NOT DETECTED NOT DETECTED Final   Enteroaggregative E coli (EAEC) NOT DETECTED NOT DETECTED Final   Enteropathogenic E coli (EPEC) NOT DETECTED NOT DETECTED Final   Enterotoxigenic E coli (ETEC) NOT DETECTED NOT DETECTED Final   Shiga like toxin producing E coli (STEC) NOT DETECTED NOT DETECTED Final   Shigella/Enteroinvasive E coli (EIEC) NOT DETECTED NOT DETECTED Final   Cryptosporidium NOT DETECTED NOT DETECTED Final   Cyclospora cayetanensis NOT DETECTED NOT DETECTED Final   Entamoeba histolytica NOT DETECTED NOT DETECTED Final   Giardia lamblia NOT  DETECTED NOT DETECTED Final   Adenovirus F40/41 NOT DETECTED NOT DETECTED Final   Astrovirus NOT DETECTED NOT DETECTED Final   Norovirus GI/GII DETECTED (A) NOT DETECTED Final    Comment: RESULT CALLED TO, READ BACK BY AND VERIFIED WITH:  [facility] ONEIL AT 0032 11/14/23 JG    Rotavirus A NOT DETECTED NOT DETECTED Final   Sapovirus (I, II, IV, and V) NOT DETECTED NOT DETECTED Final    Comment: Performed at [facility], [address], [place], [place] [address]     Radiology Studies: No results found.   Scheduled Meds:  enoxaparin (LOVENOX) injection  40 mg Subcutaneous Q24H   Gerhardt's butt cream   Topical QID   levothyroxine  62.5 mcg Oral Q0600   pantoprazole  40 mg Oral Daily   pravastatin  40 mg Oral Daily   Continuous Infusions:     LOS: 2 days   Burnadette Pop, MD [facility] P1/31/2025, 11:57 AM

## 2023-11-15 NOTE — Plan of Care (Signed)
  Problem: Clinical Measurements: Goal: Ability to maintain clinical measurements within normal limits will improve Outcome: Progressing Goal: Respiratory complications will improve Outcome: Progressing   Problem: Activity: Goal: Risk for activity intolerance will decrease Outcome: Progressing   Problem: Pain Managment: Goal: General experience of comfort will improve and/or be controlled Outcome: Progressing   Problem: Safety: Goal: Ability to remain free from injury will improve Outcome: Progressing

## 2023-11-15 NOTE — TOC Progression Note (Signed)
Transition of Care [facility]) - Progression Note    Patient Details  Name: Charlene Thomas MRN: [id number] Date of Birth: 08-04-1946  Transition of [facility]) CM/SW Contact  Janae Bridgeman, RN Phone Number: 11/15/2023, 9:17 AM  Clinical Narrative:    Patient's daughter, Marcelino Duster called and states that she was called out of town to [place] for a family emergency and will not return until Monday/Tuesday.  I called and spoke with the patient's son and he requests SNF work up since no family are available at this time for care in the home and patient is unable to return to home alone without care.  I updated the MD and will work on SNF work up.       Expected Discharge Plan and Services                                               Social Determinants of Health (SDOH) Interventions SDOH Screenings   Food Insecurity: Patient Unable To Answer (11/12/2023)  Housing: Patient Unable To Answer (11/12/2023)  Transportation Needs: Patient Unable To Answer (11/12/2023)  Utilities: Patient Unable To Answer (11/12/2023)  Social Connections: Patient Unable To Answer (11/12/2023)  Tobacco Use: Medium Risk (11/12/2023)    Readmission Risk Interventions    11/13/2023    4:18 PM  Readmission Risk Prevention Plan  Transportation Screening Complete  PCP or Specialist Appt within 5-7 Days Complete  Home Care Screening Complete  Medication Review (RN CM) Complete

## 2023-11-15 NOTE — Progress Notes (Signed)
Physical Therapy Treatment Patient Details Name: Charlene Thomas MRN: [id number] DOB: [birth year] Today's Date: 11/15/2023   History of Present Illness Pt is an 78 y/o F admitted on 11/12/23 after presenting with c/o confusion, N&V. Pt tested + for Norovirus. PMH: dementia, HTN, palpitations, hypothyroidism, GERD, arthritis    PT Comments  Pt seen for PT tx with pt agreeable. Pt requires min assist to exit L side of bed with cuing for use of bed rails, HOB elevated. Pt eager to get OOB to recliner but once sitting EOB c/o nausea with + emesis. Pt left sitting EOB with bed alarm set, call bell in reach, nurse made aware. Per case manager, pt's daughter not currently able to assist pt & pt not able to d/c home alone. Recommend post acute rehab <3 hours therapy/day to maximize independence & decrease fall risk prior to d/c home.    If plan is discharge home, recommend the following: A lot of help with walking and/or transfers;A lot of help with bathing/dressing/bathroom;Direct supervision/assist for medications management;Direct supervision/assist for financial management;Help with stairs or ramp for entrance;Supervision due to cognitive status   Can travel by private vehicle        Equipment Recommendations  Wheelchair (measurements PT);Wheelchair cushion (measurements PT)    Recommendations for Other Services       Precautions / Restrictions Precautions Precautions: Fall Restrictions Weight Bearing Restrictions Per Provider Order: No     Mobility  Bed Mobility Overal bed mobility: Needs Assistance Bed Mobility: Supine to Sit     Supine to sit: Min assist, HOB elevated, Used rails     General bed mobility comments: cuing to use bed rail, min assist to come to upright sitting EOB, exit L side of bed    Transfers                        Ambulation/Gait                   Stairs             Wheelchair Mobility     Tilt Bed    Modified Rankin  (Stroke Patients Only)       Balance Overall balance assessment: Needs assistance Sitting-balance support: Feet supported, Single extremity supported Sitting balance-Leahy Scale: Fair Sitting balance - Comments: supervision static sitting EOB                                    Cognition Arousal: Alert Behavior During Therapy: Flat affect Overall Cognitive Status: History of cognitive impairments - at baseline                                 General Comments: pleasant, follows simple commands throughout session        Exercises      General Comments        Pertinent Vitals/Pain Pain Assessment Pain Assessment: No/denies pain    Home Living                          Prior Function            PT Goals (current goals can now be found in the care plan section) Acute Rehab PT Goals Patient Stated Goal: none stated; daughter for pt to get stronger  and return home PT Goal Formulation: With patient/family Time For Goal Achievement: 11/27/23 Potential to Achieve Goals: Good Progress towards PT goals: Progressing toward goals    Frequency    Min 1X/week      PT Plan      Co-evaluation              AM-PAC PT "6 Clicks" Mobility   Outcome Measure  Help needed turning from your back to your side while in a flat bed without using bedrails?: A Little Help needed moving from lying on your back to sitting on the side of a flat bed without using bedrails?: A Lot Help needed moving to and from a bed to a chair (including a wheelchair)?: A Lot Help needed standing up from a chair using your arms (e.g., wheelchair or bedside chair)?: A Lot Help needed to walk in hospital room?: Total Help needed climbing 3-5 steps with a railing? : Total 6 Click Score: 11    End of Session   Activity Tolerance:  (limited by N&V) Patient left: with call bell/phone within reach;with bed alarm set;in bed (sitting EOB)   PT Visit Diagnosis:  Unsteadiness on feet (R26.81);Muscle weakness (generalized) (M62.81);Difficulty in walking, not elsewhere classified (R26.2)     Time: 1610-9604 PT Time Calculation (min) (ACUTE ONLY): 13 min  Charges:    $Therapeutic Activity: 8-22 mins PT General Charges $$ ACUTE PT VISIT: 1 Visit                     Aleda Grana, PT, DPT 11/15/23, 10:15 AM   Sandi Mariscal 11/15/2023, 10:13 AM

## 2023-11-16 DIAGNOSIS — R4182 Altered mental status, unspecified: Secondary | ICD-10-CM | POA: Diagnosis not present

## 2023-11-16 LAB — BASIC METABOLIC PANEL
Anion gap: 14 (ref 5–15)
BUN: 8 mg/dL (ref 8–23)
CO2: 21 mmol/L — ABNORMAL LOW (ref 22–32)
Calcium: 8.8 mg/dL — ABNORMAL LOW (ref 8.9–10.3)
Chloride: 98 mmol/L (ref 98–111)
Creatinine, Ser: 0.7 mg/dL (ref 0.44–1.00)
GFR, Estimated: >60 mL/min (ref >60)
Glucose, Bld: 78 mg/dL (ref 70–99)
Potassium: 3.8 mmol/L (ref 3.5–5.1)
Sodium: 133 mmol/L — ABNORMAL LOW (ref 135–145)

## 2023-11-16 NOTE — Plan of Care (Signed)

## 2023-11-16 NOTE — Progress Notes (Signed)
PROGRESS NOTE  Charlene Thomas  MWN:[id number] DOB: 21-Aug-1946 DOA: 11/12/2023 PCP: Marguarite Arbour, MD   Brief Narrative: Charlene Thomas is a 78 y.o. female with medical history significant of dementia, hypertension, palpitations, hypothyroidism, GERD, arthritis who presented here from home with complaint of confusion, nausea, vomiting.  Patient was admitted with same symptoms on 1/24 and was discharged on 1/25. She was doing okay after discharge but she started having several episode of vomiting, became confused .  Vomiting was projectile.  After admission, she also started having loose stools.  GI pathogen panel sent.  GI consulted,S/P EGD with normal findings.  Noro virus came out to be  positive.  Plan to advance the diet slowly , started on soft diet.  PT has recommended SNF on discharge.  Waiting for bed   11/16/2023: As per collateral information, family wants to take patient home with support.  Likely discharge back home tomorrow.  Assessment & Plan:  Principal Problem:   AMS (altered mental status) Active Problems:   Hypertension   Hypothyroidism   GERD (gastroesophageal reflux disease)   Dementia with behavioral disturbance (HCC)   Hyponatremia   Dyslipidemia   Low serum cortisol level   Nausea & vomiting  Altered mental status on the background of dementia: Unclear etiology.  Patient was admitted for the same last time.  Patient has dementia and is confused at baseline but she was thought to be more confused, less verbal, also had intermittent weakness on the left side..  CT head showed no acute findings.  Normal ammonia level,.  MRI of the brain did not show any acute findings. TSH checked on 1/24 was within normal limit. Continue frequent orientation, delirium precautions. Patient takes desvenlafaxine, modafinil, baclofen as needed.  Will hold this medicines for now. Patient lives with her daughter, ambulatory with cane at home.  She is confused at baseline.  She is close  to her baseline now 11/16/2023: Stable.  Pursue disposition.   Nausea/vomiting/diarrhea/Noro virus: Was admitted with this problem on last admission.  No fever or leukocytosis.  Currently not on antibiotics.  No report of abdominal pain . Abdomen was benign on examination.  CT abdomen/pelvis did not show any acute findings.  GI consulted.  Started on Protonix.  GI consulted,S/P EGD with normal findings.  Noro virus came out to be  positive.  Her symptoms are mostly from the virus.  Abdomen is benign on examination.  Advanced diet to soft.  Continue antiemetics if needed. 11/16/2023: Resolved.  Low serum cortisol level: During last admission, a.m. cortisol level was checked and it was 4.5.Briefly started  Cortef, but now held.  ACTH stimulation test normal.   Hyponatremia: -Sodium is 133 today.  Continue to monitor closely.   History of hypertension: Takes metoprolol, Cardizem, losartan at home.  Blood pressure trended up, will restart metoprolol To 125: Goal blood pressure should be less than 130/80 mmHg if tolerated.   Hypothyroidism: Continue Synthyroid   Hyperlipidemia: On pravastatin   GERD: Continue PPI   Debility/deconditioning: Patient lives with daughter.  Consulted PT/OT, recommended SNF on discharge.TOC following         DVT prophylaxis:enoxaparin (LOVENOX) injection 40 mg Start: 11/12/23 1530     Code Status: Full Code  Family Communication: Daughter at bedside on 1/29.  Son at bedside at 1/30  Patient status: Inpatient  Patient is from :home  Anticipated discharge to:SNF   Estimated DC date: 1-2 days   Consultants: GI  Procedures: Plan for EGD  Antimicrobials:  Anti-infectives (From admission, onward)    None       Subjective: Patient seen and examined at bedside today. No new complaints Awaiting disposition  Objective: Vitals:   11/15/23 1308 11/15/23 2103 11/16/23 0449 11/16/23 1009  BP: 137/68 (!) 147/72 116/66 133/86  Pulse: 74 71 64 96   Resp:  17 17 18   Temp:  97.6 F (36.4 C) 98.1 F (36.7 C) 97.7 F (36.5 C)  TempSrc:    Oral  SpO2:  97% 95% 96%  Weight:      Height:        Intake/Output Summary (Last 24 hours) at 11/16/2023 1218 Last data filed at 11/15/2023 1853 Gross per 24 hour  Intake 555.03 ml  Output --  Net 555.03 ml    Filed Weights   11/12/23 0946  Weight: 58.9 kg    Examination:   General exam: Not in any distress.  Awake and alert.   HEENT: Patient is pale.   Respiratory system: Clear to auscultation.    Cardiovascular system: S1 & S2 .  Gastrointestinal system: Abdomen is soft and nontender. Central nervous system: Awake and alert.     Data Reviewed: I have personally reviewed following labs and imaging studies  CBC: Recent Labs  Lab 11/12/23 1002  WBC 8.2  NEUTROABS 7.5  HGB 11.2*  HCT 33.8*  MCV 82.0  PLT 243   Basic Metabolic Panel: Recent Labs  Lab 11/12/23 1002 11/13/23 0628 11/13/23 0629 11/14/23 0733 11/15/23 0709 11/16/23 0741  NA 131*  --  132* 131* 130* 133*  K 3.6  --  3.1* 3.2* 3.6 3.8  CL 94*  --  98 95* 96* 98  CO2 23  --  23 24 23  21*  GLUCOSE 108*  --  99 105* 89 78  BUN 7*  --  7* 5* 9 8  CREATININE 0.79  --  0.73 0.61 0.68 0.70  CALCIUM 8.9  --  8.3* 8.5* 8.8* 8.8*  MG 1.6* 2.2  --   --   --   --      Recent Results (from the past 240 hours)  Resp panel by RT-PCR (RSV, Flu A&B, Covid) Anterior Nasal Swab     Status: None   Collection Time: 11/08/23  7:58 PM   Specimen: Anterior Nasal Swab  Result Value Ref Range Status   SARS Coronavirus 2 by RT PCR NEGATIVE NEGATIVE Final   Influenza A by PCR NEGATIVE NEGATIVE Final   Influenza B by PCR NEGATIVE NEGATIVE Final    Comment: (NOTE) The Xpert Xpress SARS-CoV-2/FLU/RSV plus assay is intended as an aid in the diagnosis of influenza from Nasopharyngeal swab specimens and should not be used as a sole basis for treatment. Nasal washings and aspirates are unacceptable for Xpert Xpress  SARS-CoV-2/FLU/RSV testing.  Fact Sheet for Patients: BloggerCourse.com  Fact Sheet for Healthcare Providers: SeriousBroker.it  This test is not yet approved or cleared by the [place] FDA and has been authorized for detection and/or diagnosis of SARS-CoV-2 by FDA under an Emergency Use Authorization (EUA). This EUA will remain in effect (meaning this test can be used) for the duration of the COVID-19 declaration under Section 564(b)(1) of the Act, 21 U.S.C. section 360bbb-3(b)(1), unless the authorization is terminated or revoked.     Resp Syncytial Virus by PCR NEGATIVE NEGATIVE Final    Comment: (NOTE) Fact Sheet for Patients: BloggerCourse.com  Fact Sheet for Healthcare Providers: SeriousBroker.it  This test is not yet approved or  cleared by the [place] and has been authorized for detection and/or diagnosis of SARS-CoV-2 by FDA under an Emergency Use Authorization (EUA). This EUA will remain in effect (meaning this test can be used) for the duration of the COVID-19 declaration under Section 564(b)(1) of the Act, 21 U.S.C. section 360bbb-3(b)(1), unless the authorization is terminated or revoked.  Performed at [facility], [address], [place], [place] [address]   Resp panel by RT-PCR (RSV, Flu A&B, Covid) Anterior Nasal Swab     Status: None   Collection Time: 11/12/23  8:45 PM   Specimen: Anterior Nasal Swab  Result Value Ref Range Status   SARS Coronavirus 2 by RT PCR NEGATIVE NEGATIVE Final   Influenza A by PCR NEGATIVE NEGATIVE Final   Influenza B by PCR NEGATIVE NEGATIVE Final    Comment: (NOTE) The Xpert Xpress SARS-CoV-2/FLU/RSV plus assay is intended as an aid in the diagnosis of influenza from Nasopharyngeal swab specimens and should not be used as a sole basis for treatment. Nasal washings and aspirates are unacceptable for Xpert  Xpress SARS-CoV-2/FLU/RSV testing.  Fact Sheet for Patients: BloggerCourse.com  Fact Sheet for Healthcare Providers: SeriousBroker.it  This test is not yet approved or cleared by the [place] FDA and has been authorized for detection and/or diagnosis of SARS-CoV-2 by FDA under an Emergency Use Authorization (EUA). This EUA will remain in effect (meaning this test can be used) for the duration of the COVID-19 declaration under Section 564(b)(1) of the Act, 21 U.S.C. section 360bbb-3(b)(1), unless the authorization is terminated or revoked.     Resp Syncytial Virus by PCR NEGATIVE NEGATIVE Final    Comment: (NOTE) Fact Sheet for Patients: BloggerCourse.com  Fact Sheet for Healthcare Providers: SeriousBroker.it  This test is not yet approved or cleared by the [place] FDA and has been authorized for detection and/or diagnosis of SARS-CoV-2 by FDA under an Emergency Use Authorization (EUA). This EUA will remain in effect (meaning this test can be used) for the duration of the COVID-19 declaration under Section 564(b)(1) of the Act, 21 U.S.C. section 360bbb-3(b)(1), unless the authorization is terminated or revoked.  Performed at [facility], [address] [place], [place] [address]   Gastrointestinal Panel by PCR , Stool     Status: Abnormal   Collection Time: 11/13/23  8:59 AM   Specimen: Stool  Result Value Ref Range Status   Campylobacter species NOT DETECTED NOT DETECTED Final   Plesimonas shigelloides NOT DETECTED NOT DETECTED Final   Salmonella species NOT DETECTED NOT DETECTED Final   Yersinia enterocolitica NOT DETECTED NOT DETECTED Final   Vibrio species NOT DETECTED NOT DETECTED Final   Vibrio cholerae NOT DETECTED NOT DETECTED Final   Enteroaggregative E coli (EAEC) NOT DETECTED NOT DETECTED Final   Enteropathogenic E coli (EPEC) NOT DETECTED NOT  DETECTED Final   Enterotoxigenic E coli (ETEC) NOT DETECTED NOT DETECTED Final   Shiga like toxin producing E coli (STEC) NOT DETECTED NOT DETECTED Final   Shigella/Enteroinvasive E coli (EIEC) NOT DETECTED NOT DETECTED Final   Cryptosporidium NOT DETECTED NOT DETECTED Final   Cyclospora cayetanensis NOT DETECTED NOT DETECTED Final   Entamoeba histolytica NOT DETECTED NOT DETECTED Final   Giardia lamblia NOT DETECTED NOT DETECTED Final   Adenovirus F40/41 NOT DETECTED NOT DETECTED Final   Astrovirus NOT DETECTED NOT DETECTED Final   Norovirus GI/GII DETECTED (A) NOT DETECTED Final    Comment: RESULT CALLED TO, READ BACK BY AND VERIFIED  WITH:  [facility] ONEIL AT 0032 11/14/23 JG    Rotavirus A NOT DETECTED NOT DETECTED Final   Sapovirus (I, II, IV, and V) NOT DETECTED NOT DETECTED Final    Comment: Performed at [facility], [address], [place], [place] [address]     Radiology Studies: No results found.   Scheduled Meds:  enoxaparin (LOVENOX) injection  40 mg Subcutaneous Q24H   Gerhardt's butt cream   Topical QID   levothyroxine  62.5 mcg Oral Q0600   metoprolol tartrate  25 mg Oral BID   pantoprazole  40 mg Oral Daily   pravastatin  40 mg Oral Daily   Continuous Infusions:     LOS: 3 days   Barnetta Chapel, MD [facility] P2/10/2023, 12:18 PM

## 2023-11-17 DIAGNOSIS — R112 Nausea with vomiting, unspecified: Secondary | ICD-10-CM | POA: Diagnosis not present

## 2023-11-17 MED ORDER — LEVOTHYROXINE SODIUM 125 MCG PO TABS: 62.5000 ug | ORAL_TABLET | Freq: Every day | ORAL | 0 refills | Status: AC

## 2023-11-17 MED ORDER — GERHARDT'S BUTT CREAM: 1.0000 | TOPICAL_CREAM | Freq: Four times a day (QID) | CUTANEOUS | Status: AC

## 2023-11-17 MED ORDER — CALCIUM CARBONATE 600 MG PO TABS: 600.0000 mg | ORAL_TABLET | Freq: Every morning | ORAL | Status: AC

## 2023-11-17 NOTE — Discharge Summary (Signed)
 Physician Discharge Summary  Patient ID: Charlene Thomas MRN: [id number] DOB/AGE: 1946/04/16 78 y.o.  Admit date: 11/12/2023 Discharge date: 11/17/2023  Admission Diagnoses:  Discharge Diagnoses:  Principal Problem:   AMS (altered mental status) Active Problems:   Hypertension   Hypothyroidism   GERD (gastroesophageal reflux disease)   Dementia with behavioral disturbance (HCC)   Hyponatremia   Dyslipidemia   Low serum cortisol level   Nausea & vomiting   Discharged Condition: stable  Hospital Course: Patient is an 78 year old female, with past medical history significant for dementia, hypertension, palpitations, hypothyroidism, GERD, and arthritis.  Patient presented with confusion, nausea, vomiting.  Patient was admitted with same symptoms on 11/08/2023 and was discharged on 11/09/2023.  Patient was doing okay after discharge, but she started having several episode of vomiting, and became confused .  Vomiting was projectile.  After admission, developed loose stools.  GI pathogen panel sent.  GI consulted, S/P EGD with normal findings.  Noro virus came out to be  positive.  Diet was slowly advanced.  Patient be discharged to the care of the primary care provider.  Altered mental status on the background of dementia: Unclear etiology.  Patient was admitted for the same last time.  Patient has dementia and is confused at baseline but she was thought to be more confused, less verbal, also had intermittent weakness on the left side..  CT head showed no acute findings.  Normal ammonia level,.  MRI of the brain did not show any acute findings. TSH checked on 1/24 was within normal limit. Continue frequent orientation, delirium precautions. Patient takes desvenlafaxine, modafinil, baclofen as needed.  Will hold this medicines for now. Patient lives with her daughter, ambulatory with cane at home.  She is confused at baseline.  She is close to her baseline now 11/16/2023: Stable.  Pursue  disposition.   Nausea/vomiting/diarrhea/Noro virus: Was admitted with this problem on last admission.  No fever or leukocytosis.  Currently not on antibiotics.  No report of abdominal pain . Abdomen was benign on examination.  CT abdomen/pelvis did not show any acute findings.  GI consulted.  Started on Protonix.  GI consulted,S/P EGD with normal findings.  Noro virus came out to be  positive.  Her symptoms are mostly from the virus.  Abdomen is benign on examination.  Advanced diet to soft.  Continue antiemetics if needed. 11/16/2023: Resolved.   Low serum cortisol level: During last admission, a.m. cortisol level was checked and it was 4.5.Briefly started  Cortef, but now held.  ACTH stimulation test normal.   Hyponatremia: -Continue to monitor closely.   History of hypertension: Takes metoprolol, Cardizem, losartan at home.  Blood pressure trended up, will restart metoprolol To 125: Goal blood pressure should be less than 130/80 mmHg if tolerated.   Hypothyroidism: Continue Synthyroid   Hyperlipidemia: On pravastatin   GERD: Continue PPI   Debility/deconditioning: Patient lives with daughter.  Consulted PT/OT, recommended SNF on discharge.  Consults: GI  Significant Diagnostic Studies:  EGD revealed: The esophagus was normal.      The stomach was normal.      The cardia and gastric fundus were normal on retroflexion.      The examined duodenum was normal. Impression:       - Normal esophagus.                           - Normal stomach.                           -  Normal examined duodenum.                           - No specimens collected.  Discharge Exam: Blood pressure (!) 145/96, pulse 82, temperature 98 F (36.7 C), temperature source Oral, resp. rate 17, height 5\' 3"  (1.6 m), weight 58.9 kg, SpO2 98%.   Disposition: Discharge disposition: 01-Home or Self Care       Discharge Instructions     Diet - low sodium heart healthy   Complete by: As directed     Increase activity slowly   Complete by: As directed       Allergies as of 11/17/2023       Reactions   Topiramate Other (See Comments)   Causes severe confusion   Aspirin    Celecoxib Other (See Comments)   Gi upset   Ezetimibe-simvastatin Other (See Comments)   Other Reaction(s): Muscle Pain   Mobic [meloxicam] Itching   Raloxifene Other (See Comments)   Other Reaction(s): Unknown Upset gi        Medication List     STOP taking these medications    acetaminophen 500 MG tablet Commonly known as: TYLENOL   albuterol 108 (90 Base) MCG/ACT inhaler Commonly known as: VENTOLIN HFA   alendronate 70 MG tablet Commonly known as: FOSAMAX   baclofen 20 MG tablet Commonly known as: LIORESAL   cetirizine 10 MG tablet Commonly known as: ZYRTEC   desvenlafaxine 50 MG 24 hr tablet Commonly known as: PRISTIQ   diltiazem 240 MG 24 hr capsule Commonly known as: CARDIZEM CD   modafinil 100 MG tablet Commonly known as: PROVIGIL   omeprazole 20 MG capsule Commonly known as: PRILOSEC   ondansetron 4 MG tablet Commonly known as: ZOFRAN   Potassium 99 MG Tabs   sodium chloride 1 g tablet       TAKE these medications    calcium carbonate 600 MG Tabs tablet Commonly known as: Calcium 600 Take 1 tablet (600 mg total) by mouth in the morning.   cyanocobalamin 1000 MCG tablet Commonly known as: VITAMIN B12 Take 1,000 mcg by mouth at bedtime.   Gerhardt's butt cream Crea Apply 1 Application topically 4 (four) times daily.   levothyroxine 125 MCG tablet Commonly known as: SYNTHROID Take 0.5 tablets (62.5 mcg total) by mouth daily at 6 (six) AM. Start taking on: November 18, 2023 What changed: when to take this   losartan 100 MG tablet Commonly known as: COZAAR Take 100 mg by mouth at bedtime.   metoprolol tartrate 25 MG tablet Commonly known as: LOPRESSOR Take 25 mg by mouth 2 (two) times daily.   pravastatin 40 MG tablet Commonly known as: PRAVACHOL Take 1  tablet (40 mg total) by mouth daily. What changed: when to take this   Vitamin D (Ergocalciferol) 1.25 MG (50000 UNIT) Caps capsule Commonly known as: DRISDOL Take 50,000 Units by mouth once a week. On Sundays   WOMENS MULTIVITAMIN GUMMIES PO Take 2 each by mouth in the morning. Vitafusion               Durable Medical Equipment  (From admission, onward)           Start     Ordered   11/13/23 1613  For home use only DME standard manual wheelchair with seat cushion  Once       Comments: Patient suffers from generalized weakness, dementia which impairs their ability to perform daily activities  like toileting in the home.  A walker will not resolve issue with performing activities of daily living. A wheelchair will allow patient to safely perform daily activities. Patient can safely propel the wheelchair in the home or has a caregiver who can provide assistance. Length of need 12 months . Accessories: elevating leg rests (ELRs), wheel locks, extensions and anti-tippers.   11/13/23 1613            Follow-up Information     [facility] Follow up.   Specialty: Home Health Services Why: [facility] will re-start home health services.  They will call you in the next 24-48 hours to re-start services for PT/OT. Contact information: [address] [place] [address] [phone]         [facility] Follow up.   Why: [employer] was called to deliver a wheelchair to your home. Contact information: [address] [place] [address] [phone]                 Time spent: 35 minutes.  SignedBarnetta Chapel 11/17/2023, 10:58 AM

## 2023-11-17 NOTE — Plan of Care (Signed)

## 2023-11-22 DIAGNOSIS — E871 Hypo-osmolality and hyponatremia: Secondary | ICD-10-CM | POA: Diagnosis not present

## 2023-11-22 DIAGNOSIS — Z87442 Personal history of urinary calculi: Secondary | ICD-10-CM | POA: Diagnosis not present

## 2023-11-22 DIAGNOSIS — E039 Hypothyroidism, unspecified: Secondary | ICD-10-CM | POA: Diagnosis not present

## 2023-11-22 DIAGNOSIS — F03918 Unspecified dementia, unspecified severity, with other behavioral disturbance: Secondary | ICD-10-CM | POA: Diagnosis not present

## 2023-11-22 DIAGNOSIS — K219 Gastro-esophageal reflux disease without esophagitis: Secondary | ICD-10-CM | POA: Diagnosis not present

## 2023-11-22 DIAGNOSIS — I10 Essential (primary) hypertension: Secondary | ICD-10-CM | POA: Diagnosis not present

## 2023-11-22 DIAGNOSIS — F05 Delirium due to known physiological condition: Secondary | ICD-10-CM | POA: Diagnosis not present

## 2023-11-22 DIAGNOSIS — M1990 Unspecified osteoarthritis, unspecified site: Secondary | ICD-10-CM | POA: Diagnosis not present

## 2023-11-22 DIAGNOSIS — E785 Hyperlipidemia, unspecified: Secondary | ICD-10-CM | POA: Diagnosis not present

## 2023-11-25 DIAGNOSIS — E785 Hyperlipidemia, unspecified: Secondary | ICD-10-CM | POA: Diagnosis not present

## 2023-11-25 DIAGNOSIS — R7309 Other abnormal glucose: Secondary | ICD-10-CM | POA: Diagnosis not present

## 2023-11-25 DIAGNOSIS — I10 Essential (primary) hypertension: Secondary | ICD-10-CM | POA: Diagnosis not present

## 2023-11-25 DIAGNOSIS — F0390 Unspecified dementia without behavioral disturbance: Secondary | ICD-10-CM | POA: Diagnosis not present

## 2023-11-25 DIAGNOSIS — E079 Disorder of thyroid, unspecified: Secondary | ICD-10-CM | POA: Diagnosis not present

## 2023-11-25 DIAGNOSIS — Z79899 Other long term (current) drug therapy: Secondary | ICD-10-CM | POA: Diagnosis not present

## 2023-11-25 DIAGNOSIS — Z0000 Encounter for general adult medical examination without abnormal findings: Secondary | ICD-10-CM | POA: Diagnosis not present

## 2023-11-26 DIAGNOSIS — Z87442 Personal history of urinary calculi: Secondary | ICD-10-CM | POA: Diagnosis not present

## 2023-11-26 DIAGNOSIS — F05 Delirium due to known physiological condition: Secondary | ICD-10-CM | POA: Diagnosis not present

## 2023-11-26 DIAGNOSIS — F03918 Unspecified dementia, unspecified severity, with other behavioral disturbance: Secondary | ICD-10-CM | POA: Diagnosis not present

## 2023-11-26 DIAGNOSIS — E871 Hypo-osmolality and hyponatremia: Secondary | ICD-10-CM | POA: Diagnosis not present

## 2023-11-26 DIAGNOSIS — I10 Essential (primary) hypertension: Secondary | ICD-10-CM | POA: Diagnosis not present

## 2023-11-26 DIAGNOSIS — E039 Hypothyroidism, unspecified: Secondary | ICD-10-CM | POA: Diagnosis not present

## 2023-11-26 DIAGNOSIS — E785 Hyperlipidemia, unspecified: Secondary | ICD-10-CM | POA: Diagnosis not present

## 2023-11-26 DIAGNOSIS — M1990 Unspecified osteoarthritis, unspecified site: Secondary | ICD-10-CM | POA: Diagnosis not present

## 2023-11-26 DIAGNOSIS — K219 Gastro-esophageal reflux disease without esophagitis: Secondary | ICD-10-CM | POA: Diagnosis not present

## 2023-11-27 DIAGNOSIS — Z87442 Personal history of urinary calculi: Secondary | ICD-10-CM | POA: Diagnosis not present

## 2023-11-27 DIAGNOSIS — E039 Hypothyroidism, unspecified: Secondary | ICD-10-CM | POA: Diagnosis not present

## 2023-11-27 DIAGNOSIS — K219 Gastro-esophageal reflux disease without esophagitis: Secondary | ICD-10-CM | POA: Diagnosis not present

## 2023-11-27 DIAGNOSIS — E785 Hyperlipidemia, unspecified: Secondary | ICD-10-CM | POA: Diagnosis not present

## 2023-11-27 DIAGNOSIS — E871 Hypo-osmolality and hyponatremia: Secondary | ICD-10-CM | POA: Diagnosis not present

## 2023-11-27 DIAGNOSIS — F05 Delirium due to known physiological condition: Secondary | ICD-10-CM | POA: Diagnosis not present

## 2023-11-27 DIAGNOSIS — M1990 Unspecified osteoarthritis, unspecified site: Secondary | ICD-10-CM | POA: Diagnosis not present

## 2023-11-27 DIAGNOSIS — I10 Essential (primary) hypertension: Secondary | ICD-10-CM | POA: Diagnosis not present

## 2023-11-27 DIAGNOSIS — F03918 Unspecified dementia, unspecified severity, with other behavioral disturbance: Secondary | ICD-10-CM | POA: Diagnosis not present

## 2023-11-28 DIAGNOSIS — F05 Delirium due to known physiological condition: Secondary | ICD-10-CM | POA: Diagnosis not present

## 2023-11-28 DIAGNOSIS — K219 Gastro-esophageal reflux disease without esophagitis: Secondary | ICD-10-CM | POA: Diagnosis not present

## 2023-11-28 DIAGNOSIS — M1990 Unspecified osteoarthritis, unspecified site: Secondary | ICD-10-CM | POA: Diagnosis not present

## 2023-11-28 DIAGNOSIS — I10 Essential (primary) hypertension: Secondary | ICD-10-CM | POA: Diagnosis not present

## 2023-11-28 DIAGNOSIS — E785 Hyperlipidemia, unspecified: Secondary | ICD-10-CM | POA: Diagnosis not present

## 2023-11-28 DIAGNOSIS — F03918 Unspecified dementia, unspecified severity, with other behavioral disturbance: Secondary | ICD-10-CM | POA: Diagnosis not present

## 2023-11-28 DIAGNOSIS — E039 Hypothyroidism, unspecified: Secondary | ICD-10-CM | POA: Diagnosis not present

## 2023-11-28 DIAGNOSIS — Z87442 Personal history of urinary calculi: Secondary | ICD-10-CM | POA: Diagnosis not present

## 2023-11-28 DIAGNOSIS — E871 Hypo-osmolality and hyponatremia: Secondary | ICD-10-CM | POA: Diagnosis not present

## 2023-12-03 DIAGNOSIS — F05 Delirium due to known physiological condition: Secondary | ICD-10-CM | POA: Diagnosis not present

## 2023-12-03 DIAGNOSIS — F03918 Unspecified dementia, unspecified severity, with other behavioral disturbance: Secondary | ICD-10-CM | POA: Diagnosis not present

## 2023-12-03 DIAGNOSIS — E871 Hypo-osmolality and hyponatremia: Secondary | ICD-10-CM | POA: Diagnosis not present

## 2023-12-03 DIAGNOSIS — E039 Hypothyroidism, unspecified: Secondary | ICD-10-CM | POA: Diagnosis not present

## 2023-12-03 DIAGNOSIS — E785 Hyperlipidemia, unspecified: Secondary | ICD-10-CM | POA: Diagnosis not present

## 2023-12-03 DIAGNOSIS — M1990 Unspecified osteoarthritis, unspecified site: Secondary | ICD-10-CM | POA: Diagnosis not present

## 2023-12-03 DIAGNOSIS — K219 Gastro-esophageal reflux disease without esophagitis: Secondary | ICD-10-CM | POA: Diagnosis not present

## 2023-12-03 DIAGNOSIS — I10 Essential (primary) hypertension: Secondary | ICD-10-CM | POA: Diagnosis not present

## 2023-12-03 DIAGNOSIS — Z87442 Personal history of urinary calculi: Secondary | ICD-10-CM | POA: Diagnosis not present

## 2023-12-04 DIAGNOSIS — I10 Essential (primary) hypertension: Secondary | ICD-10-CM | POA: Diagnosis not present

## 2023-12-04 DIAGNOSIS — F03918 Unspecified dementia, unspecified severity, with other behavioral disturbance: Secondary | ICD-10-CM | POA: Diagnosis not present

## 2023-12-04 DIAGNOSIS — K219 Gastro-esophageal reflux disease without esophagitis: Secondary | ICD-10-CM | POA: Diagnosis not present

## 2023-12-04 DIAGNOSIS — F05 Delirium due to known physiological condition: Secondary | ICD-10-CM | POA: Diagnosis not present

## 2023-12-04 DIAGNOSIS — E039 Hypothyroidism, unspecified: Secondary | ICD-10-CM | POA: Diagnosis not present

## 2023-12-04 DIAGNOSIS — E785 Hyperlipidemia, unspecified: Secondary | ICD-10-CM | POA: Diagnosis not present

## 2023-12-06 DIAGNOSIS — Z87442 Personal history of urinary calculi: Secondary | ICD-10-CM | POA: Diagnosis not present

## 2023-12-06 DIAGNOSIS — M1990 Unspecified osteoarthritis, unspecified site: Secondary | ICD-10-CM | POA: Diagnosis not present

## 2023-12-06 DIAGNOSIS — F05 Delirium due to known physiological condition: Secondary | ICD-10-CM | POA: Diagnosis not present

## 2023-12-06 DIAGNOSIS — E785 Hyperlipidemia, unspecified: Secondary | ICD-10-CM | POA: Diagnosis not present

## 2023-12-06 DIAGNOSIS — K219 Gastro-esophageal reflux disease without esophagitis: Secondary | ICD-10-CM | POA: Diagnosis not present

## 2023-12-06 DIAGNOSIS — I10 Essential (primary) hypertension: Secondary | ICD-10-CM | POA: Diagnosis not present

## 2023-12-06 DIAGNOSIS — F03918 Unspecified dementia, unspecified severity, with other behavioral disturbance: Secondary | ICD-10-CM | POA: Diagnosis not present

## 2023-12-06 DIAGNOSIS — E871 Hypo-osmolality and hyponatremia: Secondary | ICD-10-CM | POA: Diagnosis not present

## 2023-12-06 DIAGNOSIS — E039 Hypothyroidism, unspecified: Secondary | ICD-10-CM | POA: Diagnosis not present

## 2023-12-09 DIAGNOSIS — F03918 Unspecified dementia, unspecified severity, with other behavioral disturbance: Secondary | ICD-10-CM | POA: Diagnosis not present

## 2023-12-09 DIAGNOSIS — K219 Gastro-esophageal reflux disease without esophagitis: Secondary | ICD-10-CM | POA: Diagnosis not present

## 2023-12-09 DIAGNOSIS — E039 Hypothyroidism, unspecified: Secondary | ICD-10-CM | POA: Diagnosis not present

## 2023-12-09 DIAGNOSIS — I10 Essential (primary) hypertension: Secondary | ICD-10-CM | POA: Diagnosis not present

## 2023-12-09 DIAGNOSIS — M1990 Unspecified osteoarthritis, unspecified site: Secondary | ICD-10-CM | POA: Diagnosis not present

## 2023-12-09 DIAGNOSIS — E785 Hyperlipidemia, unspecified: Secondary | ICD-10-CM | POA: Diagnosis not present

## 2023-12-09 DIAGNOSIS — E871 Hypo-osmolality and hyponatremia: Secondary | ICD-10-CM | POA: Diagnosis not present

## 2023-12-09 DIAGNOSIS — F05 Delirium due to known physiological condition: Secondary | ICD-10-CM | POA: Diagnosis not present

## 2023-12-09 DIAGNOSIS — Z87442 Personal history of urinary calculi: Secondary | ICD-10-CM | POA: Diagnosis not present

## 2023-12-12 DIAGNOSIS — E039 Hypothyroidism, unspecified: Secondary | ICD-10-CM | POA: Diagnosis not present

## 2023-12-12 DIAGNOSIS — F05 Delirium due to known physiological condition: Secondary | ICD-10-CM | POA: Diagnosis not present

## 2023-12-12 DIAGNOSIS — M1990 Unspecified osteoarthritis, unspecified site: Secondary | ICD-10-CM | POA: Diagnosis not present

## 2023-12-12 DIAGNOSIS — E871 Hypo-osmolality and hyponatremia: Secondary | ICD-10-CM | POA: Diagnosis not present

## 2023-12-12 DIAGNOSIS — K219 Gastro-esophageal reflux disease without esophagitis: Secondary | ICD-10-CM | POA: Diagnosis not present

## 2023-12-12 DIAGNOSIS — I10 Essential (primary) hypertension: Secondary | ICD-10-CM | POA: Diagnosis not present

## 2023-12-12 DIAGNOSIS — F03918 Unspecified dementia, unspecified severity, with other behavioral disturbance: Secondary | ICD-10-CM | POA: Diagnosis not present

## 2023-12-12 DIAGNOSIS — E785 Hyperlipidemia, unspecified: Secondary | ICD-10-CM | POA: Diagnosis not present

## 2023-12-12 DIAGNOSIS — Z87442 Personal history of urinary calculi: Secondary | ICD-10-CM | POA: Diagnosis not present

## 2023-12-16 DIAGNOSIS — E871 Hypo-osmolality and hyponatremia: Secondary | ICD-10-CM | POA: Diagnosis not present

## 2023-12-16 DIAGNOSIS — M1990 Unspecified osteoarthritis, unspecified site: Secondary | ICD-10-CM | POA: Diagnosis not present

## 2023-12-16 DIAGNOSIS — F03918 Unspecified dementia, unspecified severity, with other behavioral disturbance: Secondary | ICD-10-CM | POA: Diagnosis not present

## 2023-12-16 DIAGNOSIS — E039 Hypothyroidism, unspecified: Secondary | ICD-10-CM | POA: Diagnosis not present

## 2023-12-16 DIAGNOSIS — Z87442 Personal history of urinary calculi: Secondary | ICD-10-CM | POA: Diagnosis not present

## 2023-12-16 DIAGNOSIS — F05 Delirium due to known physiological condition: Secondary | ICD-10-CM | POA: Diagnosis not present

## 2023-12-16 DIAGNOSIS — K219 Gastro-esophageal reflux disease without esophagitis: Secondary | ICD-10-CM | POA: Diagnosis not present

## 2023-12-16 DIAGNOSIS — I10 Essential (primary) hypertension: Secondary | ICD-10-CM | POA: Diagnosis not present

## 2023-12-16 DIAGNOSIS — E785 Hyperlipidemia, unspecified: Secondary | ICD-10-CM | POA: Diagnosis not present

## 2023-12-26 DIAGNOSIS — F03918 Unspecified dementia, unspecified severity, with other behavioral disturbance: Secondary | ICD-10-CM | POA: Diagnosis not present

## 2023-12-26 DIAGNOSIS — E785 Hyperlipidemia, unspecified: Secondary | ICD-10-CM | POA: Diagnosis not present

## 2023-12-26 DIAGNOSIS — E871 Hypo-osmolality and hyponatremia: Secondary | ICD-10-CM | POA: Diagnosis not present

## 2023-12-26 DIAGNOSIS — E039 Hypothyroidism, unspecified: Secondary | ICD-10-CM | POA: Diagnosis not present

## 2023-12-26 DIAGNOSIS — K219 Gastro-esophageal reflux disease without esophagitis: Secondary | ICD-10-CM | POA: Diagnosis not present

## 2023-12-26 DIAGNOSIS — Z87442 Personal history of urinary calculi: Secondary | ICD-10-CM | POA: Diagnosis not present

## 2023-12-26 DIAGNOSIS — F05 Delirium due to known physiological condition: Secondary | ICD-10-CM | POA: Diagnosis not present

## 2023-12-26 DIAGNOSIS — I10 Essential (primary) hypertension: Secondary | ICD-10-CM | POA: Diagnosis not present

## 2023-12-26 DIAGNOSIS — M1990 Unspecified osteoarthritis, unspecified site: Secondary | ICD-10-CM | POA: Diagnosis not present

## 2023-12-31 DIAGNOSIS — E871 Hypo-osmolality and hyponatremia: Secondary | ICD-10-CM | POA: Diagnosis not present

## 2023-12-31 DIAGNOSIS — I10 Essential (primary) hypertension: Secondary | ICD-10-CM | POA: Diagnosis not present

## 2023-12-31 DIAGNOSIS — K219 Gastro-esophageal reflux disease without esophagitis: Secondary | ICD-10-CM | POA: Diagnosis not present

## 2023-12-31 DIAGNOSIS — F03918 Unspecified dementia, unspecified severity, with other behavioral disturbance: Secondary | ICD-10-CM | POA: Diagnosis not present

## 2023-12-31 DIAGNOSIS — Z87442 Personal history of urinary calculi: Secondary | ICD-10-CM | POA: Diagnosis not present

## 2023-12-31 DIAGNOSIS — E039 Hypothyroidism, unspecified: Secondary | ICD-10-CM | POA: Diagnosis not present

## 2023-12-31 DIAGNOSIS — M1990 Unspecified osteoarthritis, unspecified site: Secondary | ICD-10-CM | POA: Diagnosis not present

## 2023-12-31 DIAGNOSIS — F05 Delirium due to known physiological condition: Secondary | ICD-10-CM | POA: Diagnosis not present

## 2023-12-31 DIAGNOSIS — E785 Hyperlipidemia, unspecified: Secondary | ICD-10-CM | POA: Diagnosis not present

## 2024-01-07 DIAGNOSIS — E871 Hypo-osmolality and hyponatremia: Secondary | ICD-10-CM | POA: Diagnosis not present

## 2024-01-07 DIAGNOSIS — E039 Hypothyroidism, unspecified: Secondary | ICD-10-CM | POA: Diagnosis not present

## 2024-01-07 DIAGNOSIS — M1990 Unspecified osteoarthritis, unspecified site: Secondary | ICD-10-CM | POA: Diagnosis not present

## 2024-01-07 DIAGNOSIS — F05 Delirium due to known physiological condition: Secondary | ICD-10-CM | POA: Diagnosis not present

## 2024-01-07 DIAGNOSIS — Z87442 Personal history of urinary calculi: Secondary | ICD-10-CM | POA: Diagnosis not present

## 2024-01-07 DIAGNOSIS — F03918 Unspecified dementia, unspecified severity, with other behavioral disturbance: Secondary | ICD-10-CM | POA: Diagnosis not present

## 2024-01-07 DIAGNOSIS — K219 Gastro-esophageal reflux disease without esophagitis: Secondary | ICD-10-CM | POA: Diagnosis not present

## 2024-01-07 DIAGNOSIS — E785 Hyperlipidemia, unspecified: Secondary | ICD-10-CM | POA: Diagnosis not present

## 2024-01-07 DIAGNOSIS — I10 Essential (primary) hypertension: Secondary | ICD-10-CM | POA: Diagnosis not present

## 2024-01-28 DIAGNOSIS — F0390 Unspecified dementia without behavioral disturbance: Secondary | ICD-10-CM | POA: Diagnosis not present

## 2024-01-28 DIAGNOSIS — I10 Essential (primary) hypertension: Secondary | ICD-10-CM | POA: Diagnosis not present

## 2024-01-28 DIAGNOSIS — E039 Hypothyroidism, unspecified: Secondary | ICD-10-CM | POA: Diagnosis not present

## 2024-01-28 DIAGNOSIS — R7303 Prediabetes: Secondary | ICD-10-CM | POA: Diagnosis not present

## 2024-01-28 DIAGNOSIS — E782 Mixed hyperlipidemia: Secondary | ICD-10-CM | POA: Diagnosis not present

## 2024-01-28 DIAGNOSIS — Z79899 Other long term (current) drug therapy: Secondary | ICD-10-CM | POA: Diagnosis not present

## 2024-02-11 DIAGNOSIS — E039 Hypothyroidism, unspecified: Secondary | ICD-10-CM | POA: Diagnosis not present

## 2024-02-11 DIAGNOSIS — M7989 Other specified soft tissue disorders: Secondary | ICD-10-CM | POA: Diagnosis not present

## 2024-02-11 DIAGNOSIS — R8290 Unspecified abnormal findings in urine: Secondary | ICD-10-CM | POA: Diagnosis not present

## 2024-02-11 DIAGNOSIS — Z79899 Other long term (current) drug therapy: Secondary | ICD-10-CM | POA: Diagnosis not present

## 2024-02-11 DIAGNOSIS — I10 Essential (primary) hypertension: Secondary | ICD-10-CM | POA: Diagnosis not present

## 2024-04-03 ENCOUNTER — Emergency Department

## 2024-04-03 ENCOUNTER — Other Ambulatory Visit: Payer: Self-pay

## 2024-04-03 ENCOUNTER — Emergency Department
Admission: EM | Admit: 2024-04-03 | Discharge: 2024-04-03 | Disposition: A | Discharge status: Home / Self Care | Attending: Emergency Medicine | Admitting: Emergency Medicine

## 2024-04-03 DIAGNOSIS — R609 Edema, unspecified: Secondary | ICD-10-CM | POA: Diagnosis not present

## 2024-04-03 DIAGNOSIS — R410 Disorientation, unspecified: Secondary | ICD-10-CM | POA: Diagnosis not present

## 2024-04-03 DIAGNOSIS — R231 Pallor: Secondary | ICD-10-CM | POA: Diagnosis not present

## 2024-04-03 DIAGNOSIS — M7989 Other specified soft tissue disorders: Secondary | ICD-10-CM | POA: Diagnosis not present

## 2024-04-03 DIAGNOSIS — R0789 Other chest pain: Secondary | ICD-10-CM | POA: Insufficient documentation

## 2024-04-03 DIAGNOSIS — R531 Weakness: Secondary | ICD-10-CM | POA: Insufficient documentation

## 2024-04-03 DIAGNOSIS — E039 Hypothyroidism, unspecified: Secondary | ICD-10-CM | POA: Insufficient documentation

## 2024-04-03 DIAGNOSIS — I10 Essential (primary) hypertension: Secondary | ICD-10-CM | POA: Diagnosis not present

## 2024-04-03 DIAGNOSIS — R600 Localized edema: Secondary | ICD-10-CM | POA: Insufficient documentation

## 2024-04-03 DIAGNOSIS — M79662 Pain in left lower leg: Secondary | ICD-10-CM

## 2024-04-03 DIAGNOSIS — R9082 White matter disease, unspecified: Secondary | ICD-10-CM | POA: Diagnosis not present

## 2024-04-03 DIAGNOSIS — R079 Chest pain, unspecified: Secondary | ICD-10-CM | POA: Diagnosis not present

## 2024-04-03 DIAGNOSIS — F0390 Unspecified dementia without behavioral disturbance: Secondary | ICD-10-CM | POA: Insufficient documentation

## 2024-04-03 DIAGNOSIS — R519 Headache, unspecified: Secondary | ICD-10-CM | POA: Diagnosis not present

## 2024-04-03 DIAGNOSIS — R4182 Altered mental status, unspecified: Secondary | ICD-10-CM | POA: Diagnosis not present

## 2024-04-03 DIAGNOSIS — M79605 Pain in left leg: Secondary | ICD-10-CM | POA: Diagnosis not present

## 2024-04-03 LAB — URINALYSIS, ROUTINE W REFLEX MICROSCOPIC
Bilirubin Urine: NEGATIVE
Glucose, UA: NEGATIVE mg/dL
Hgb urine dipstick: NEGATIVE
Ketones, ur: NEGATIVE mg/dL
Leukocytes,Ua: NEGATIVE
Nitrite: NEGATIVE
Protein, ur: NEGATIVE mg/dL
Specific Gravity, Urine: 1.004 — ABNORMAL LOW (ref 1.005–1.030)
pH: 5 (ref 5.0–8.0)

## 2024-04-03 LAB — CBC
HCT: 33.3 % — ABNORMAL LOW (ref 36.0–46.0)
Hemoglobin: 10.7 g/dL — ABNORMAL LOW (ref 12.0–15.0)
MCH: 25.5 pg — ABNORMAL LOW (ref 26.0–34.0)
MCHC: 32.1 g/dL (ref 30.0–36.0)
MCV: 79.3 fL — ABNORMAL LOW (ref 80.0–100.0)
Platelets: 287 10*3/uL (ref 150–400)
RBC: 4.2 MIL/uL (ref 3.87–5.11)
RDW: 15.4 % (ref 11.5–15.5)
WBC: 5.9 10*3/uL (ref 4.0–10.5)
nRBC: 0 % (ref 0.0–0.2)

## 2024-04-03 LAB — COMPREHENSIVE METABOLIC PANEL WITH GFR
ALT: 17 U/L (ref 0–44)
AST: 24 U/L (ref 15–41)
Albumin: 4.1 g/dL (ref 3.5–5.0)
Alkaline Phosphatase: 90 U/L (ref 38–126)
Anion gap: 6 (ref 5–15)
BUN: 10 mg/dL (ref 8–23)
CO2: 29 mmol/L (ref 22–32)
Calcium: 9.3 mg/dL (ref 8.9–10.3)
Chloride: 95 mmol/L — ABNORMAL LOW (ref 98–111)
Creatinine, Ser: 0.78 mg/dL (ref 0.44–1.00)
GFR, Estimated: >60 mL/min (ref >60)
Glucose, Bld: 104 mg/dL — ABNORMAL HIGH (ref 70–99)
Potassium: 3.6 mmol/L (ref 3.5–5.1)
Sodium: 130 mmol/L — ABNORMAL LOW (ref 135–145)
Total Bilirubin: 0.6 mg/dL (ref 0.0–1.2)
Total Protein: 7.9 g/dL (ref 6.5–8.1)

## 2024-04-03 LAB — TROPONIN I (HIGH SENSITIVITY)
Troponin I (High Sensitivity): 5 ng/L (ref <18)
Troponin I (High Sensitivity): 5 ng/L (ref <18)

## 2024-04-03 NOTE — ED Triage Notes (Signed)
 Arrives from home. C/O generalized weakness, L>R, nausea, and swelling to lower legs.  Per EMS, family stated that patient experienced left sided weakness and headache that started this morning at 0830.  No Current c/o left sided weakness and no headache. Patient with nausea in Ambulance.  VS:  140/80 78 Pulse 16 98% RA CBG:  144  20g right wrist.  4 mg Zofran  given PTA

## 2024-04-03 NOTE — ED Provider Notes (Signed)
 Care assumed of patient from outgoing provider.  See their note for initial history, exam and plan.  Clinical Course as of 04/03/24 1614  Fri Apr 03, 2024  1512 History of dementia - episode of not moving L arm and chest pain.  Ct head neg. EKG and first trop neg.  Back to baseline and labs reassuring. Repeat trop and if neg and dvt is neg - plan to dc home.  []  if dvt + will w/u PE.  []  if neg and second trop neg, plan to dc.  [SM]    Clinical Course User Index [SM] Viviano Ground, MD   Delta troponin is negative.  DVT ultrasound was negative.  Discharged home in stable condition.   Viviano Ground, MD 04/03/24 [phone]

## 2024-04-03 NOTE — ED Provider Notes (Signed)
 [facility] Provider Note    Event Date/Time   First MD Initiated Contact with Patient 04/03/24 1308     (approximate)   History   Chief Complaint Weakness   HPI  Charlene Thomas is a 78 y.o. female with past medical history of hypertension, hyperlipidemia, GERD, hypothyroidism, and dementia who presents to the ED complaining of weakness.  Daughter at bedside reports that patient had an episode today where she became acutely weak and seem to be clutching the left side of her chest.  She did not lose consciousness, but daughter found her less responsive than usual while sitting on the couch, holding her left arm against her chest and resisting any movement of the arm.  Patient did not fall or hit her head, has since returned to her baseline mental status and denies any complaints.  Patient does not recall the episode.     Physical Exam   Triage Vital Signs: ED Triage Vitals  Encounter Vitals Group     BP 04/03/24 1302 124/81     Girls Systolic BP Percentile --      Girls Diastolic BP Percentile --      Boys Systolic BP Percentile --      Boys Diastolic BP Percentile --      Pulse Rate 04/03/24 1302 79     Resp 04/03/24 1302 16     Temp 04/03/24 1302 98.4 F (36.9 C)     Temp Source 04/03/24 1302 Oral     SpO2 04/03/24 1302 92 %     Weight 04/03/24 1304 175 lb 7.8 oz (79.6 kg)     Height --      Head Circumference --      Peak Flow --      Pain Score --      Pain Loc --      Pain Education --      Exclude from Growth Chart --     Most recent vital signs: Vitals:   04/03/24 1302  BP: 124/81  Pulse: 79  Resp: 16  Temp: 98.4 F (36.9 C)  SpO2: 92%    Constitutional: Awake and alert. Eyes: Conjunctivae are normal. Head: Atraumatic. Nose: No congestion/rhinnorhea. Mouth/Throat: Mucous membranes are moist.  Cardiovascular: Normal rate, regular rhythm. Grossly normal heart sounds.  2+ radial and DP pulses bilaterally. Respiratory:  Normal respiratory effort.  No retractions. Lungs CTAB.  No chest wall tenderness to palpation. Gastrointestinal: Soft and nontender. No distention. Musculoskeletal: 1+ firm edema to left lower extremity without erythema, warmth, or tenderness.  No edema or tenderness noted to right lower extremity. Neurologic:  Normal speech and language. No gross focal neurologic deficits are appreciated.    ED Results / Procedures / Treatments   Labs (all labs ordered are listed, but only abnormal results are displayed) Labs Reviewed  COMPREHENSIVE METABOLIC PANEL WITH GFR - Abnormal; Notable for the following components:      Result Value   Sodium 130 (*)    Chloride 95 (*)    Glucose, Bld 104 (*)    All other components within normal limits  CBC - Abnormal; Notable for the following components:   Hemoglobin 10.7 (*)    HCT 33.3 (*)    MCV 79.3 (*)    MCH 25.5 (*)    All other components within normal limits  URINALYSIS, ROUTINE W REFLEX MICROSCOPIC - Abnormal; Notable for the following components:   Color, Urine STRAW (*)    APPearance CLEAR (*)  Specific Gravity, Urine 1.004 (*)    All other components within normal limits  TROPONIN I (HIGH SENSITIVITY)     EKG  ED ECG REPORT I, Twilla Galea, the attending physician, personally viewed and interpreted this ECG.   Date: 04/03/2024  EKG Time: 13:05  Rate: 75  Rhythm: normal sinus rhythm  Axis: Normal  Intervals:none  ST&T Change: None  RADIOLOGY CT head reviewed and interpreted by me with no hemorrhage or midline shift.  PROCEDURES:  Critical Care performed: No  Procedures   MEDICATIONS ORDERED IN ED: Medications - No data to display   IMPRESSION / MDM / ASSESSMENT AND PLAN / ED COURSE  I reviewed the triage vital signs and the nursing notes.                              78 y.o. female with past medical history of hypertension, hyperlipidemia, GERD, hypothyroidism, and dementia who presents to the ED for episode  of chest discomfort with some confusion, since resolved.  Patient's presentation is most consistent with acute presentation with potential threat to life or bodily function.  Differential diagnosis includes, but is not limited to, stroke, TIA, ACS, PE, pneumonia, pneumothorax, musculoskeletal pain, GERD, UTI.  Patient nontoxic-appearing and in no acute distress, vital signs are unremarkable.  EKG shows no evidence of arrhythmia or ischemia and she is at her baseline mental status with no focal neurologic deficits on exam.  CT head is negative for acute process, chest x-ray also unremarkable.  She does have some swelling of her left lower extremity, but is neurovascularly intact to this leg with no signs of infection.  Labs without significant anemia, leukocytosis, electrolyte abnormality, or AKI.  LFTs are unremarkable and initial troponin within normal limits.  We will check ultrasound of her left lower extremity to rule out DVT, check second set troponin.  Patient is back to her baseline mental status with no complaints currently, he is eager to be discharged home if remainder of testing is unremarkable.  Daughter is in agreement with this plan.  Patient turned over to oncoming provider pending repeat troponin and lower extremity ultrasound.      FINAL CLINICAL IMPRESSION(S) / ED DIAGNOSES   Final diagnoses:  Chest pain, unspecified type     Rx / DC Orders   ED Discharge Orders     None        Note:  This document was prepared using Dragon voice recognition software and may include unintentional dictation errors.   Twilla Galea, MD 04/03/24 1501

## 2024-04-03 NOTE — Discharge Instructions (Addendum)
 Chest x-ray did not show any signs of pneumonia.  2 heart enzymes were negative, have a low suspicion for heart attack today.  No signs of urinary tract infection.  No signs of a blood clot in the lower extremities.  CT scan of the head did not show any signs of bleeding.  Follow-up closely with your primary care physician.  Return to the emergency department for any return of symptoms.

## 2024-06-12 DIAGNOSIS — M25512 Pain in left shoulder: Secondary | ICD-10-CM | POA: Diagnosis not present

## 2024-06-12 DIAGNOSIS — I10 Essential (primary) hypertension: Secondary | ICD-10-CM | POA: Diagnosis not present

## 2024-06-12 DIAGNOSIS — R8290 Unspecified abnormal findings in urine: Secondary | ICD-10-CM | POA: Diagnosis not present

## 2024-06-12 DIAGNOSIS — E782 Mixed hyperlipidemia: Secondary | ICD-10-CM | POA: Diagnosis not present

## 2024-06-12 DIAGNOSIS — R7303 Prediabetes: Secondary | ICD-10-CM | POA: Diagnosis not present

## 2024-06-12 DIAGNOSIS — Z79899 Other long term (current) drug therapy: Secondary | ICD-10-CM | POA: Diagnosis not present

## 2024-06-12 DIAGNOSIS — R399 Unspecified symptoms and signs involving the genitourinary system: Secondary | ICD-10-CM | POA: Diagnosis not present

## 2024-06-12 DIAGNOSIS — F0390 Unspecified dementia without behavioral disturbance: Secondary | ICD-10-CM | POA: Diagnosis not present

## 2024-06-12 DIAGNOSIS — E039 Hypothyroidism, unspecified: Secondary | ICD-10-CM | POA: Diagnosis not present

## 2024-07-23 DIAGNOSIS — M19112 Post-traumatic osteoarthritis, left shoulder: Secondary | ICD-10-CM | POA: Diagnosis not present

## 2024-11-05 ENCOUNTER — Other Ambulatory Visit: Payer: Self-pay | Admitting: Surgery

## 2024-11-05 DIAGNOSIS — S42202P Unspecified fracture of upper end of left humerus, subsequent encounter for fracture with malunion: Secondary | ICD-10-CM

## 2024-11-26 ENCOUNTER — Ambulatory Visit
